# Patient Record
Sex: Male | Born: 1950 | Race: White | Hispanic: No | Marital: Married | Smoking: Never smoker
Health system: Southern US, Community
[De-identification: ages and names within clinical notes are randomized; demographics above are authoritative.]

## PROBLEM LIST (undated history)

## (undated) DIAGNOSIS — E790 Hyperuricemia without signs of inflammatory arthritis and tophaceous disease: Secondary | ICD-10-CM

## (undated) DIAGNOSIS — F329 Major depressive disorder, single episode, unspecified: Secondary | ICD-10-CM

## (undated) DIAGNOSIS — R55 Syncope and collapse: Secondary | ICD-10-CM

## (undated) DIAGNOSIS — I10 Essential (primary) hypertension: Secondary | ICD-10-CM

## (undated) DIAGNOSIS — F32A Depression, unspecified: Secondary | ICD-10-CM

## (undated) DIAGNOSIS — K649 Unspecified hemorrhoids: Secondary | ICD-10-CM

## (undated) DIAGNOSIS — E785 Hyperlipidemia, unspecified: Secondary | ICD-10-CM

## (undated) DIAGNOSIS — Z974 Presence of external hearing-aid: Secondary | ICD-10-CM

## (undated) DIAGNOSIS — C61 Malignant neoplasm of prostate: Secondary | ICD-10-CM

## (undated) DIAGNOSIS — C4490 Unspecified malignant neoplasm of skin, unspecified: Secondary | ICD-10-CM

## (undated) HISTORY — DX: Hyperlipidemia, unspecified: E78.5

## (undated) HISTORY — DX: Major depressive disorder, single episode, unspecified: F32.9

## (undated) HISTORY — DX: Essential (primary) hypertension: I10

## (undated) HISTORY — DX: Hyperuricemia without signs of inflammatory arthritis and tophaceous disease: E79.0

## (undated) HISTORY — DX: Unspecified malignant neoplasm of skin, unspecified: C44.90

## (undated) HISTORY — DX: Syncope and collapse: R55

## (undated) HISTORY — DX: Unspecified hemorrhoids: K64.9

## (undated) HISTORY — DX: Malignant neoplasm of prostate: C61

## (undated) HISTORY — DX: Depression, unspecified: F32.A

---

## 2001-12-14 ENCOUNTER — Emergency Department (HOSPITAL_COMMUNITY): Admission: EM | Admit: 2001-12-14 | Discharge: 2001-12-14 | Payer: Self-pay | Admitting: Emergency Medicine

## 2009-02-20 ENCOUNTER — Ambulatory Visit: Payer: Self-pay | Admitting: General Surgery

## 2009-02-20 HISTORY — PX: COLONOSCOPY: SHX174

## 2009-02-20 LAB — HM COLONOSCOPY: HM Colonoscopy: NORMAL

## 2009-04-13 ENCOUNTER — Ambulatory Visit: Payer: Self-pay | Admitting: General Surgery

## 2009-09-19 DIAGNOSIS — C61 Malignant neoplasm of prostate: Secondary | ICD-10-CM

## 2009-09-19 HISTORY — DX: Malignant neoplasm of prostate: C61

## 2009-09-22 ENCOUNTER — Encounter: Payer: Self-pay | Admitting: Family Medicine

## 2010-01-11 ENCOUNTER — Encounter: Payer: Self-pay | Admitting: Family Medicine

## 2010-02-25 DIAGNOSIS — C4491 Basal cell carcinoma of skin, unspecified: Secondary | ICD-10-CM

## 2010-02-25 HISTORY — DX: Basal cell carcinoma of skin, unspecified: C44.91

## 2010-04-15 ENCOUNTER — Ambulatory Visit: Payer: Self-pay | Admitting: Family Medicine

## 2010-04-15 DIAGNOSIS — E785 Hyperlipidemia, unspecified: Secondary | ICD-10-CM | POA: Insufficient documentation

## 2010-04-15 DIAGNOSIS — R6882 Decreased libido: Secondary | ICD-10-CM | POA: Insufficient documentation

## 2010-04-15 DIAGNOSIS — E790 Hyperuricemia without signs of inflammatory arthritis and tophaceous disease: Secondary | ICD-10-CM | POA: Insufficient documentation

## 2010-04-15 DIAGNOSIS — I10 Essential (primary) hypertension: Secondary | ICD-10-CM | POA: Insufficient documentation

## 2010-04-15 DIAGNOSIS — F329 Major depressive disorder, single episode, unspecified: Secondary | ICD-10-CM | POA: Insufficient documentation

## 2010-06-01 ENCOUNTER — Ambulatory Visit: Payer: Self-pay | Admitting: Family Medicine

## 2010-06-02 LAB — CONVERTED CEMR LAB
Albumin: 4.2 g/dL (ref 3.5–5.2)
BUN: 13 mg/dL (ref 6–23)
Calcium: 9.3 mg/dL (ref 8.4–10.5)
Cholesterol: 157 mg/dL (ref 0–200)
GFR calc non Af Amer: 74.4 mL/min (ref >60)
Glucose, Bld: 90 mg/dL (ref 70–99)
HDL: 33.7 mg/dL — ABNORMAL LOW (ref >39.00)
LDL Cholesterol: 95 mg/dL (ref 0–99)
Potassium: 4.8 meq/L (ref 3.5–5.1)
Sodium: 143 meq/L (ref 135–145)
Total Bilirubin: 0.6 mg/dL (ref 0.3–1.2)
Triglycerides: 144 mg/dL (ref 0.0–149.0)
VLDL: 28.8 mg/dL (ref 0.0–40.0)

## 2010-06-14 ENCOUNTER — Ambulatory Visit: Payer: Self-pay | Admitting: Family Medicine

## 2010-06-14 DIAGNOSIS — E291 Testicular hypofunction: Secondary | ICD-10-CM | POA: Insufficient documentation

## 2010-06-16 ENCOUNTER — Telehealth: Payer: Self-pay | Admitting: Family Medicine

## 2010-07-06 ENCOUNTER — Encounter: Payer: Self-pay | Admitting: Family Medicine

## 2010-08-10 ENCOUNTER — Encounter: Payer: Self-pay | Admitting: Family Medicine

## 2010-08-26 ENCOUNTER — Encounter: Payer: Self-pay | Admitting: Family Medicine

## 2010-10-04 ENCOUNTER — Ambulatory Visit
Admission: RE | Admit: 2010-10-04 | Discharge: 2010-10-19 | Payer: Self-pay | Source: Home / Self Care | Attending: Radiation Oncology | Admitting: Radiation Oncology

## 2010-10-05 ENCOUNTER — Encounter: Payer: Self-pay | Admitting: Family Medicine

## 2010-10-14 ENCOUNTER — Encounter
Admission: RE | Admit: 2010-10-14 | Discharge: 2010-10-14 | Payer: Self-pay | Source: Home / Self Care | Attending: Urology | Admitting: Urology

## 2010-10-19 NOTE — Progress Notes (Signed)
  Phone Note Outgoing Call   Summary of Call: I called patient and LMOVM.  I'll attempt to call him again later.  Initial call taken by: Crawford Givens MD,  June 16, 2010 12:43 PM  Follow-up for Phone Call        I called the patient.  He understands not to fill the testosterone and that his PSA was elevated.  Prev had been  ~1.5 with one reading  ~3.  Given the increase, I would rec follow up with URO and he agrees.  Follow-up by: Crawford Givens MD,  June 16, 2010 5:45 PM  New Problems: PSA, INCREASED (ICD-790.93)   New Problems: PSA, INCREASED (ICD-790.93)

## 2010-10-19 NOTE — Assessment & Plan Note (Signed)
Summary: FOLLOW UP ON LABS/[facility]   Vital Signs:  Patient profile:   60 year old male Height:      72 inches Weight:      214.75 pounds BMI:     29.23 Temp:     98.5 degrees F oral Pulse rate:   72 / minute Pulse rhythm:   regular BP sitting:   132 / 76  (left arm) Cuff size:   large  Vitals Entered By: Delilah Shan CMA Alan Riles Dull) (June 14, 2010 3:24 PM) CC: Follow-up visit - labs   History of Present Illness: fatigue and decrease in libido with borderline testosterone.  This was a repeat value- he has been just below normal twice.   H/o depression that has been treated with some effect with effexor.  No SI/HI.  Asking about options at this point.  Changing MDD meds had had little benefit for patient before.  We discussed low T and possible options.  He would like to try a defined course to see if it changed his symptoms.  See plan.  We talked about risk of T replacement, including need to check on PSA changes and hepatic function.  Mood changes d/w patient and he understood.    Allergies: 1)  ! Codeine 2)  ! Wellbutrin 3)  ! Lipitor  Past History:  Past Medical History: HTN HLD gout depression low testosterone  2011  Review of Systems       See HPI.  Otherwise negative.    Physical Exam  General:  GEN: nad, alert and oriented, affect appropriate Prostate exam w/o nodularity, asymmetry, or tenderness.     Impression & Recommendations:  Problem # 1:  TESTICULAR HYPOFUNCTION (ICD-257.2)  Will check TSH and PSA,  if normal will start androgel and then follow up for labs in 6 weeks.  He will need follow up lab orders based on PSA and TSH (if T replacement started).  He is aware of potential mood changes and need for monitoring.  He'll notify me if he has any troubles once he starts replacement.  We will monitor for symptoms change and continue if appropriate.   Orders: TLB-TSH (Thyroid Stimulating Hormone) (84443-TSH) TLB-PSA (Prostate Specific Antigen)  (84153-PSA)  Complete Medication List: 1)  Crestor 10 Mg Tabs (Rosuvastatin calcium) .Marland Kitchen.. 1 by mouth once daily 2)  Lisinopril 10 Mg Tabs (Lisinopril) .Marland Kitchen.. 1 by mouth once daily 3)  Effexor Xr 75 Mg Xr24h-cap (Venlafaxine hcl) .Marland Kitchen.. 1 by mouth once daily 4)  Allopurinol 300 Mg Tabs (Allopurinol) .Marland Kitchen.. 1 by mouth once daily 5)  Androgel Pump 1.25 Gm/act (1%) Gel (Testosterone) .... 4 pumps applied qam  Patient Instructions: 1)  We'll contact you with your lab report.  If they are normal, then use the gel. Use 4 pumps in the morning.  Let me know how you are doing.  We need to check labs after being on the medicine for about 6 weeks.  Take care.  Prescriptions: ANDROGEL PUMP 1.25 GM/ACT (1%) GEL (TESTOSTERONE) 4 pumps applied qAM  #1 box x 0   Entered and Authorized by:   Crawford Givens MD   Signed by:   Crawford Givens MD on 06/14/2010   Method used:   Print then Give to Patient   RxID:   [id number]   Current Allergies (reviewed today): ! CODEINE ! WELLBUTRIN ! LIPITOR

## 2010-10-19 NOTE — Letter (Signed)
Summary: [facility]  [facility]   Imported By: Lanelle Bal 04/20/2010 14:42:16  _____________________________________________________________________  External Attachment:    Type:   Image     Comment:   External Document

## 2010-10-19 NOTE — Consult Note (Signed)
Summary: [facility]  [facility]   Imported By: Lanelle Bal 07/14/2010 13:20:11  _____________________________________________________________________  External Attachment:    Type:   Image     Comment:   External Document  Appended Document: [facility]    Clinical Lists Changes  Observations: Added new observation of PAST MED HX: HTN HLD gout depression low testosterone  2011 elevated PSA with biopsy per Dr. Isabel Caprice 2011 (07/14/2010 21:33)       Past History:  Past Medical History: HTN HLD gout depression low testosterone  2011 elevated PSA with biopsy per Dr. Isabel Caprice 2011

## 2010-10-19 NOTE — Assessment & Plan Note (Signed)
Summary: NEW PT TO BE ESTABLISHED   Vital Signs:  Patient profile:   60 year old male Weight:      215.50 pounds Temp:     98.3 degrees F oral Pulse rate:   84 / minute Pulse rhythm:   regular BP sitting:   140 / 82  (left arm) Cuff size:   large  Vitals Entered By: Janee Morn CMA (April 15, 2010 9:47 AM) CC: New patient to establish care   History of Present Illness: Prev patient of Dr. Cherlynn Polo in [place].  Elevated Cholesterol: Using medications without problems:yes Muscle aches: no Other complaints: intolerant of other statins.    Hypertension:      Using medication without problems or lightheadedness: yes Chest pain with exertion:no Edema:no Short of breath:no Average home BPs: not checked Other issues: no  Depressed Mood:  controlled on meds, prev in counseling.  Doing well.  Started around the time of the divorce.  Prev was on prozac and had to change to effexor.     Asking about low T, had been low normal prev.  Noted low sex drive.   Gout- L great toe.  Had done well on allopurinol.  Began to have symptoms after coming off the med.    Current Medications (verified): 1)  Crestor 10 Mg Tabs (Rosuvastatin Calcium) .Marland Kitchen.. 1 By Mouth Once Daily 2)  Lisinopril 10 Mg Tabs (Lisinopril) .Marland Kitchen.. 1 By Mouth Once Daily 3)  Effexor Xr 75 Mg Xr24h-Cap (Venlafaxine Hcl) .Marland Kitchen.. 1 By Mouth Once Daily 4)  Allopurinol 300 Mg Tabs (Allopurinol) .Marland Kitchen.. 1 By Mouth Qday  Allergies (verified): 1)  ! Codeine 2)  ! Wellbutrin 3)  ! 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 F alive and at home-hospice for parkinson's, HTN and HLD, DM2 M alive with mult heart surgeries- valve repair, osteoporosis, vertebral fx  Social History: Reviewed history and no changes required. From [employer] grad, doctorate in Counseling, prev worked in Chartered loss adjuster in [place] Occ alcohol No tob, no illicits Prev golfer Divorced after 17 years of marriage  Review of Systems       See HPI.  Otherwise negative.    Physical Exam  General:  GEN: nad, alert and oriented, affect appropriate HEENT: mucous membranes moist NECK: supple w/o LA CV: rrr.  no murmur PULM: ctab, no inc wob ABD: soft, +bs EXT: no edema SKIN: no acute rash    Impression & Recommendations:  Problem # 1:  DEPRESSIVE DISORDER (ICD-311) No change in meds.  Controlled.  His updated medication list for this problem includes:    Effexor Xr 75 Mg Xr24h-cap (Venlafaxine hcl) .Marland Kitchen... 1 by mouth once daily  Problem # 2:  HYPERLIPIDEMIA (ICD-272.4) Return for fasting labs.  No change in meds.  His updated medication list for this problem includes:    Crestor 10 Mg Tabs (Rosuvastatin calcium) .Marland Kitchen... 1 by mouth once daily  Problem # 3:  HYPERTENSION, BENIGN ESSENTIAL (ICD-401.1) controlled, no change in meds.  REturn for labs. His  updated medication list for this problem includes:    Lisinopril 10 Mg Tabs (Lisinopril) .Marland Kitchen... 1 by mouth once daily  Problem # 4:  GOUT (ICD-274.9) Restart allopurinol and return for uric acid.   His updated medication list for this problem includes:    Allopurinol 300 Mg Tabs (Allopurinol) .Marland Kitchen... 1 by mouth qday  Problem # 5:  LIBIDO, DECREASED (ICD-799.81) return for labs.  will check AM level.   Complete Medication List: 1)  Crestor 10 Mg Tabs (Rosuvastatin calcium) .Marland Kitchen.. 1 by mouth once daily 2)  Lisinopril 10 Mg Tabs (Lisinopril) .Marland Kitchen.. 1 by mouth once  daily 3)  Effexor Xr 75 Mg Xr24h-cap (Venlafaxine hcl) .Marland Kitchen.. 1 by mouth once daily 4)  Allopurinol 300 Mg Tabs (Allopurinol) .Marland Kitchen.. 1 by mouth qday  Patient Instructions: 1)  Come back for fasting labs in a 6 weeks.  2)  cmet/lipid 401.1 3)  uric acid 274.9 4)  testosterone 799.81 5)  Call the pharmacy if you need refills.  Prescriptions: ALLOPURINOL 300 MG TABS (ALLOPURINOL) 1 by mouth qday  #90 x 3   Entered and Authorized by:   Crawford Givens MD   Signed by:   Crawford Givens MD on 04/15/2010   Method used:   [facility] (retail)       [address]       [place], [place]  [address]       Ph: [phone] or [phone]       Fax: [phone]   RxID:   [id number] ALLOPURINOL 300 MG TABS (ALLOPURINOL) 1 by mouth qday  #90 x 3   Entered and Authorized by:   Crawford Givens MD   Signed by:   Crawford Givens MD on 04/15/2010   Method used:   [facility]        [facility]  [address] (retail)       [address]       [place], [place]  [address]       Ph: [phone] or [phone]       Fax: [phone]   RxID:   [id number]   Current Allergies (reviewed today): ! CODEINE ! WELLBUTRIN ! LIPITOR

## 2010-10-19 NOTE — Letter (Signed)
Summary: [facility]  [facility]   Imported By: Lanelle Bal 04/20/2010 14:40:55  _____________________________________________________________________  External Attachment:    Type:   Image     Comment:   External Document  Appended Document: [facility]    Clinical Lists Changes  Observations: Added new observation of DM PROGRESS: N/A (04/21/2010 10:48) Added new observation of DM FSREVIEW: N/A (04/21/2010 10:48) Added new observation of CHIEF CMPLNT: Preventive Care (04/21/2010 10:48) Added new observation of PNEUMOVAX: Historical (09/19/2008 20:24)         Colorectal Screening:  Colonoscopy Results:    Date of Exam: 09/19/2008  Colonoscopy Comments:    done 2010, hemorroids noted  Immunization & Chemoprophylaxis:    Pneumovax: Historical  (09/19/2008)     Immunization History:  Pneumovax Immunization History:    Pneumovax:  historical (09/19/2008)

## 2010-10-20 ENCOUNTER — Ambulatory Visit: Payer: Self-pay | Admitting: Radiation Oncology

## 2010-10-21 NOTE — Letter (Signed)
Summary: [facility]  Chipper Herb MD/[facility]   Imported By: Lester  10/12/2010 10:12:26  _____________________________________________________________________  External Attachment:    Type:   Image     Comment:   External Document  Appended Document: [facility]b [facility]    Clinical Lists Changes  Observations: Added new observation of PAST MED HX: HTN HLD gout depression low testosterone  2011 prostate CA per Dr. Isabel Caprice 2011, Seed implant per Dr. Dayton Scrape (10/12/2010 22:33)       Past History:  Past Medical History: HTN HLD gout depression low testosterone  2011 prostate CA per Dr. Isabel Caprice 2011, Seed implant per Dr. Dayton Scrape

## 2010-10-21 NOTE — Letter (Signed)
Summary: [facility]  [facility]   Imported By: Maryln Gottron 09/03/2010 14:51:13  _____________________________________________________________________  External Attachment:    Type:   Image     Comment:   External Document  Appended Document: [facility]    Clinical Lists Changes  Observations: Added new observation of PAST MED HX: HTN HLD gout depression low testosterone  2011 prostate CA per Dr. Isabel Caprice 2011 (09/05/2010 12:47)       Past History:  Past Medical History: HTN HLD gout depression low testosterone  2011 prostate CA per Dr. Isabel Caprice 2011

## 2010-11-25 ENCOUNTER — Telehealth: Payer: Self-pay | Admitting: Family Medicine

## 2010-11-26 ENCOUNTER — Encounter: Payer: Self-pay | Admitting: Family Medicine

## 2010-11-30 NOTE — Progress Notes (Signed)
Summary: elevated bp  Phone Note Call from Patient Call back at Home Phone [phone]   Caller: Patient Call For: Crawford Givens MD Summary of Call: Patient says that he has been having elevated blood pressure. He has been taking 20mg  of lisinopril instead of 10mg , the 2 reading that he gave me are 145/92 and 155/97. He is going to be out of town for work for a couple of weeks and is asking if he could take 40 mg until he is able to see you. Please advise. Initial call taken by: Melody Comas,  November 25, 2010 9:29 AM  Follow-up for Phone Call        I would increase to 30mg  of lisinopril a day (3 of the 10mg  tabs once a day) for 5 days.  If BP still >140/90, then increase to 40mg  a day.  thanks.   Follow-up by: Crawford Givens MD,  November 25, 2010 1:39 PM  Additional Follow-up for Phone Call Additional follow up Details #1::        Patient Advised.  Additional Follow-up by: Delilah Shan CMA (AAMA),  November 25, 2010 2:22 PM

## 2010-12-06 LAB — CBC
HCT: 44.5 % (ref 39.0–52.0)
Hemoglobin: 14.9 g/dL (ref 13.0–17.0)
MCH: 30.7 pg (ref 26.0–34.0)
MCV: 91.8 fL (ref 78.0–100.0)
Platelets: 278 10*3/uL (ref 150–400)
RBC: 4.85 MIL/uL (ref 4.22–5.81)
WBC: 7.2 10*3/uL (ref 4.0–10.5)

## 2010-12-06 LAB — COMPREHENSIVE METABOLIC PANEL
Albumin: 4 g/dL (ref 3.5–5.2)
Alkaline Phosphatase: 86 U/L (ref 39–117)
BUN: 17 mg/dL (ref 6–23)
CO2: 29 mEq/L (ref 19–32)
Chloride: 100 mEq/L (ref 96–112)
GFR calc non Af Amer: 59 mL/min — ABNORMAL LOW (ref >60)
Glucose, Bld: 90 mg/dL (ref 70–99)
Potassium: 4.6 mEq/L (ref 3.5–5.1)
Total Bilirubin: 0.4 mg/dL (ref 0.3–1.2)

## 2010-12-07 NOTE — Letter (Signed)
Summary: [facility]  [facility]   Imported By: Kassie Mends 12/01/2010 09:33:59  _____________________________________________________________________  External Attachment:    Type:   Image     Comment:   External Document  Appended Document: [facility]    Clinical Lists Changes

## 2010-12-13 ENCOUNTER — Ambulatory Visit (HOSPITAL_BASED_OUTPATIENT_CLINIC_OR_DEPARTMENT_OTHER)
Admission: RE | Admit: 2010-12-13 | Discharge: 2010-12-13 | Disposition: A | Discharge status: Home / Self Care | Payer: BC Managed Care – PPO | Source: Ambulatory Visit | Attending: Urology | Admitting: Urology

## 2010-12-13 ENCOUNTER — Ambulatory Visit: Payer: BC Managed Care – PPO | Attending: Radiation Oncology | Admitting: Radiation Oncology

## 2010-12-13 ENCOUNTER — Ambulatory Visit (HOSPITAL_COMMUNITY): Payer: BC Managed Care – PPO | Attending: Urology

## 2010-12-13 DIAGNOSIS — C61 Malignant neoplasm of prostate: Secondary | ICD-10-CM

## 2010-12-13 DIAGNOSIS — Z01812 Encounter for preprocedural laboratory examination: Secondary | ICD-10-CM | POA: Insufficient documentation

## 2010-12-13 DIAGNOSIS — K219 Gastro-esophageal reflux disease without esophagitis: Secondary | ICD-10-CM | POA: Insufficient documentation

## 2010-12-13 DIAGNOSIS — I10 Essential (primary) hypertension: Secondary | ICD-10-CM | POA: Insufficient documentation

## 2010-12-13 DIAGNOSIS — Z01810 Encounter for preprocedural cardiovascular examination: Secondary | ICD-10-CM | POA: Insufficient documentation

## 2010-12-14 ENCOUNTER — Encounter: Payer: Self-pay | Admitting: Family Medicine

## 2010-12-14 DIAGNOSIS — C61 Malignant neoplasm of prostate: Secondary | ICD-10-CM

## 2010-12-15 NOTE — Op Note (Signed)
  NAME:  Ryan Blevins, Ryan Blevins             ACCOUNT NO.:  [id number]  MEDICAL RECORD NO.:  [id number]          PATIENT TYPE:  AMB  LOCATION:  [facility]                         FACILITY:  [facility]  PHYSICIAN:  Valetta Fuller, M.D.  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  PROCEDURE PERFORMED: 1. I 125 prostate seed implantation via nuclear drawn robotic     implanter. 2. Flexible cystoscopy.  SURGEON:  Valetta Fuller, MD and Maryln Gottron, MD  ANESTHESIA:  General.  INDICATIONS:  Mr. Mccauley is 60 years of age.  The patient was diagnosed with favorable clinical stage T1c adenocarcinoma of the prostate.  The patient had a PSA elevation of 6.1.  Digital rectal exam was unremarkable and revealed a small prostate.  At the time of ultrasound, his prostate was in the 15-20 gram range.  One out of 12 cores were positive for Gleason 3 + 3 = 6 adenocarcinoma of the prostate.  He was felt to have clinical stage T1c favorable disease.  All options were discussed with him including active surveillance, surgical approaches, and various radiation approaches as well as additional therapies.  The patient was also seen by Dr. Dayton Scrape in consultation.  The patient chose I 125 seed implantation.  TECHNIQUE AND FINDINGS:  The patient was brought to the operating room where he had successful induction of general anesthesia.  Compression boots were ordered and the patient received perioperative ciprofloxacin. Radiation-oncology was involved in the patient's care initially.  The patient was positioned in a high lithotomy position.  Transrectal ultrasound probe was placed with an anchoring stand.  Anchoring needles were placed in the prostate.  With a transrectal  ultrasound in place, real time contouring of the prostate, urethra, and rectum were performed.  The dosing parameters were then established and a dosing plan formulated.  We were then contacted and came to the operating room. Appropriate surgical time-out was reperformed at that time.  All needle passage was done with real time sagittal transrectal ultrasound guidance.  All needle with visualization was quite good.  A total of 24 needles were placed.  The delivery of activated seeds was done by the nuclear drawn robotic implanter.  Reference dose was 145 gray.  A total of 75 active seeds were implanted.  At the completion of the procedure, the fluoroscopic distribution of the seeds appeared excellent.  The patient's Foley catheter was removed and flexible cystourethroscopy revealed no wayward seeds within the prostatic urethra or bladder.  Lidocaine jelly was instilled and a new Foley catheter was inserted with drainage of clear urine.  The patient was brought to recovery room in stable condition having had no obvious complications or problems.     Valetta Fuller, M.D.     DSG/MEDQ  D:  12/13/2010  T:  12/13/2010  Job:  [id number]  Electronically Signed by Barron Alvine M.D. on 12/15/2010 09:34:32 AM

## 2011-01-04 ENCOUNTER — Ambulatory Visit: Payer: BC Managed Care – PPO | Attending: Radiation Oncology | Admitting: Radiation Oncology

## 2011-01-04 DIAGNOSIS — C61 Malignant neoplasm of prostate: Secondary | ICD-10-CM | POA: Insufficient documentation

## 2011-01-06 ENCOUNTER — Telehealth: Payer: Self-pay | Admitting: *Deleted

## 2011-01-06 DIAGNOSIS — I10 Essential (primary) hypertension: Secondary | ICD-10-CM

## 2011-01-06 NOTE — Telephone Encounter (Signed)
Have him inc to 40mg  of lisinopril a day and then come in for nonfasting labs after about 1 week. Get a OV a few days after that.  Thanks.

## 2011-01-06 NOTE — Telephone Encounter (Signed)
Pt's lisinopril dose was increased to 30 mg's about 2 months ago.  His blood pressure is still running too high- 135-137/95-97.  He is asking if he should increase the dose again, or does he need to come back in for follow up. Please advise. [facility]

## 2011-01-07 NOTE — Telephone Encounter (Signed)
Advised pt, lab and follow up appts made.

## 2011-01-14 ENCOUNTER — Other Ambulatory Visit (INDEPENDENT_AMBULATORY_CARE_PROVIDER_SITE_OTHER): Payer: BC Managed Care – PPO | Admitting: Family Medicine

## 2011-01-14 DIAGNOSIS — I10 Essential (primary) hypertension: Secondary | ICD-10-CM

## 2011-01-14 LAB — BASIC METABOLIC PANEL
BUN: 15 mg/dL (ref 6–23)
CO2: 28 mEq/L (ref 19–32)
Chloride: 104 mEq/L (ref 96–112)
Creatinine, Ser: 1.2 mg/dL (ref 0.4–1.5)
Glucose, Bld: 98 mg/dL (ref 70–99)
Potassium: 5 mEq/L (ref 3.5–5.1)

## 2011-01-17 ENCOUNTER — Other Ambulatory Visit: Payer: Self-pay | Admitting: Family Medicine

## 2011-01-18 ENCOUNTER — Encounter: Payer: Self-pay | Admitting: Family Medicine

## 2011-01-19 ENCOUNTER — Ambulatory Visit (INDEPENDENT_AMBULATORY_CARE_PROVIDER_SITE_OTHER): Payer: BC Managed Care – PPO | Admitting: Family Medicine

## 2011-01-19 ENCOUNTER — Encounter: Payer: Self-pay | Admitting: Family Medicine

## 2011-01-19 DIAGNOSIS — I10 Essential (primary) hypertension: Secondary | ICD-10-CM

## 2011-01-19 MED ORDER — TADALAFIL 20 MG PO TABS: 20.0000 mg | ORAL_TABLET | Freq: Every day | ORAL | Status: DC | PRN

## 2011-01-19 NOTE — Patient Instructions (Signed)
Check your pressure in the morning after you urinate.  Let me know about the readings a few weeks.  Don't change your meds in the meantime.  Take care.

## 2011-01-19 NOTE — Progress Notes (Signed)
Hypertension:    Using medication without problems or lightheadedness: see below Chest pain with exertion:no Edema:no Short of breath:no Average home BPs:  Other issues: recently with prostate CA s/p seed implants and doing well.  Followed by uro and rady onc.  Prior to procedure, his BP was up.  Also with sig changes at work with resultant stress.  Was SBP 140-150.   Today is the first day that his BP has been lower.  On 20mg  a day of lisinopril.   Meds, vitals, and allergies reviewed.   ROS: See HPI.  Otherwise, noncontributory.  GEN: nad, alert and oriented HEENT: mucous membranes moist NECK: supple w/o LA CV: rrr.  PULM: ctab, no inc wob EXT: no edema SKIN: no acute rash

## 2011-01-19 NOTE — Assessment & Plan Note (Signed)
Controlled.  No change in meds.  He'll check BP at home.  I think some of the elevation was related to change in exercise routine in the midst of cancer dx/treatment and work changes.  He'll call back with update.  He agrees.  Labs d/w pt.

## 2011-03-04 ENCOUNTER — Other Ambulatory Visit: Payer: Self-pay | Admitting: *Deleted

## 2011-03-04 MED ORDER — LISINOPRIL 10 MG PO TABS: 20.0000 mg | ORAL_TABLET | Freq: Every day | ORAL | Status: DC

## 2011-03-10 ENCOUNTER — Ambulatory Visit (INDEPENDENT_AMBULATORY_CARE_PROVIDER_SITE_OTHER): Payer: BC Managed Care – PPO | Admitting: Family Medicine

## 2011-03-10 ENCOUNTER — Encounter: Payer: Self-pay | Admitting: Family Medicine

## 2011-03-10 DIAGNOSIS — S060XAA Concussion with loss of consciousness status unknown, initial encounter: Secondary | ICD-10-CM | POA: Insufficient documentation

## 2011-03-10 DIAGNOSIS — Z136 Encounter for screening for cardiovascular disorders: Secondary | ICD-10-CM

## 2011-03-10 DIAGNOSIS — R55 Syncope and collapse: Secondary | ICD-10-CM

## 2011-03-10 DIAGNOSIS — S060X9A Concussion with loss of consciousness of unspecified duration, initial encounter: Secondary | ICD-10-CM

## 2011-03-10 NOTE — Assessment & Plan Note (Addendum)
?  relation to flomax?  Stop this and call back next week.  He had orthostatic pressure today but w/o sx.  EKG reassuring and I think this is all related to orthostatic changes, not CAD.  He has no CP.  If sx continue will likely need cards eval.  D/w pt and he agreed.

## 2011-03-10 NOTE — Patient Instructions (Signed)
Stop the flomax, drink plenty of fluids, and call me with an update next week.  Try to rest in the meantime.

## 2011-03-10 NOTE — Progress Notes (Signed)
Hypertension:    Using medication without problems or lightheadedness: on meds but he had 2 episodes of syncope recently after standing.  He fell and hit his head 2 weeks ago.  He has had some word searching in the meantime with a mild HA in the meantime.  No other focal neuro changes.  He doesn't have consistent symptoms with position change.  Chest pain with exertion: no Edema:no Short of breath:no Average home BPs: similar to today, occ higher.  Other issues: not on cialis.  Prostate cancer s/p seed implant per URO.  Had been started on flomax- this predates the orthostasis.   Meds, vitals, and allergies reviewed.   PMH and SH reviewed  ROS: See HPI.  Otherwise negative.    GEN: nad, alert and oriented HEENT: mucous membranes moist, tm wnl, OP wnl NECK: supple w/o LA CV: rrr. PULM: ctab, no inc wob ABD: soft, +bs EXT: no edema SKIN: no acute rash CN 2-12 wnl B, S/S/DTR wnl x4  EKG reviewed.

## 2011-03-11 NOTE — Assessment & Plan Note (Addendum)
Likely concussion- no indication to image at this point.  Benign exam 2 weeks after last episode.  D/w pt about mental and physical rest.  If sx continue, then he'll need further w/u.  He appears okay for outpatient fu.  >25 min spent with face to face with patient, >50% counseling.

## 2011-05-02 ENCOUNTER — Encounter: Payer: Self-pay | Admitting: Family Medicine

## 2011-05-02 DIAGNOSIS — IMO0001 Reserved for inherently not codable concepts without codable children: Secondary | ICD-10-CM | POA: Insufficient documentation

## 2011-05-06 ENCOUNTER — Other Ambulatory Visit: Payer: Self-pay | Admitting: Family Medicine

## 2011-10-04 ENCOUNTER — Other Ambulatory Visit: Payer: Self-pay | Admitting: Family Medicine

## 2011-12-13 ENCOUNTER — Encounter: Payer: Self-pay | Admitting: *Deleted

## 2011-12-13 NOTE — Progress Notes (Signed)
10/05/2010=I-PSS= 1110001 score=4

## 2012-04-26 ENCOUNTER — Other Ambulatory Visit: Payer: Self-pay | Admitting: Family Medicine

## 2012-04-26 NOTE — Telephone Encounter (Signed)
Electronic refill request.  Patient is past due for OV.  Please advise.

## 2012-04-26 NOTE — Telephone Encounter (Signed)
Sent, needs cpe with labs scheduled.

## 2012-04-27 NOTE — Telephone Encounter (Signed)
Patient advised.  Call sent to scheduler for appt. 

## 2012-05-28 ENCOUNTER — Other Ambulatory Visit: Payer: Self-pay | Admitting: Family Medicine

## 2012-05-28 DIAGNOSIS — M109 Gout, unspecified: Secondary | ICD-10-CM

## 2012-05-28 DIAGNOSIS — I10 Essential (primary) hypertension: Secondary | ICD-10-CM

## 2012-05-31 ENCOUNTER — Other Ambulatory Visit: Payer: Self-pay | Admitting: Family Medicine

## 2012-05-31 ENCOUNTER — Other Ambulatory Visit: Payer: BC Managed Care – PPO

## 2012-05-31 ENCOUNTER — Other Ambulatory Visit (INDEPENDENT_AMBULATORY_CARE_PROVIDER_SITE_OTHER): Payer: BC Managed Care – PPO

## 2012-05-31 DIAGNOSIS — M109 Gout, unspecified: Secondary | ICD-10-CM

## 2012-05-31 DIAGNOSIS — F329 Major depressive disorder, single episode, unspecified: Secondary | ICD-10-CM

## 2012-05-31 DIAGNOSIS — I10 Essential (primary) hypertension: Secondary | ICD-10-CM

## 2012-05-31 LAB — COMPREHENSIVE METABOLIC PANEL
ALT: 32 U/L (ref 0–53)
AST: 30 U/L (ref 0–37)
Albumin: 4.3 g/dL (ref 3.5–5.2)
Alkaline Phosphatase: 69 U/L (ref 39–117)
BUN: 17 mg/dL (ref 6–23)
Chloride: 107 mEq/L (ref 96–112)
Potassium: 4.8 mEq/L (ref 3.5–5.1)
Sodium: 142 mEq/L (ref 135–145)

## 2012-05-31 LAB — TSH: TSH: 2.12 u[IU]/mL (ref 0.35–5.50)

## 2012-05-31 LAB — LIPID PANEL
Cholesterol: 144 mg/dL (ref 0–200)
LDL Cholesterol: 76 mg/dL (ref 0–99)
Total CHOL/HDL Ratio: 4
VLDL: 27.4 mg/dL (ref 0.0–40.0)

## 2012-06-04 ENCOUNTER — Other Ambulatory Visit: Payer: Self-pay | Admitting: Family Medicine

## 2012-06-08 ENCOUNTER — Encounter: Payer: Self-pay | Admitting: Family Medicine

## 2012-06-08 ENCOUNTER — Ambulatory Visit (INDEPENDENT_AMBULATORY_CARE_PROVIDER_SITE_OTHER): Payer: BC Managed Care – PPO | Admitting: Family Medicine

## 2012-06-08 VITALS — BP 152/90 | HR 98 | Temp 98.3°F | Wt 218.0 lb

## 2012-06-08 DIAGNOSIS — Z0000 Encounter for general adult medical examination without abnormal findings: Secondary | ICD-10-CM

## 2012-06-08 DIAGNOSIS — E785 Hyperlipidemia, unspecified: Secondary | ICD-10-CM

## 2012-06-08 DIAGNOSIS — Z23 Encounter for immunization: Secondary | ICD-10-CM

## 2012-06-08 DIAGNOSIS — I10 Essential (primary) hypertension: Secondary | ICD-10-CM

## 2012-06-08 MED ORDER — VENLAFAXINE HCL ER 75 MG PO CP24: 150.0000 mg | ORAL_CAPSULE | Freq: Every day | ORAL | Status: DC

## 2012-06-08 MED ORDER — TADALAFIL 5 MG PO TABS: 5.0000 mg | ORAL_TABLET | Freq: Every day | ORAL | Status: DC | PRN

## 2012-06-08 MED ORDER — LISINOPRIL 10 MG PO TABS: 30.0000 mg | ORAL_TABLET | Freq: Every day | ORAL | Status: DC

## 2012-06-08 NOTE — Patient Instructions (Addendum)
Check with your insurance to see if they will cover the shingles shot. I would get a flu shot each fall.   Take care. Glad to see you.  Increase the lisinopril to 30mg  a day and let me know about your pressure.

## 2012-06-10 DIAGNOSIS — Z0000 Encounter for general adult medical examination without abnormal findings: Secondary | ICD-10-CM | POA: Insufficient documentation

## 2012-06-10 NOTE — Assessment & Plan Note (Signed)
Inc ACE and he'll notify me about his BP readings.

## 2012-06-10 NOTE — Assessment & Plan Note (Signed)
Controlled, continue current meds.   

## 2012-06-10 NOTE — Progress Notes (Signed)
CPE- See plan.  Routine anticipatory guidance given to patient.  See health maintenance. Tetanus <10 years per patient Flu done yearly Shingles shot discussed. PNA shot due at 65 PSA per uro- prostate cancer followed by URO. Colonoscopy 2009 perDr. Lemar Livings.   Advance directive d/w pt. Wife designated if incapacitated.   Gout- off meds w/o flares and doing well.  Uric acid level d/w pt.   Mood stable on current meds in spite of work load. No SI/HI.  Would like to continue meds.    Hypertension:    Using medication without problems or lightheadedness: yes Chest pain with exertion:no Edema:no Short of breath:no Average home ZOX:WRUEA with episodic elevation.   PMH and SH reviewed  Meds, vitals, and allergies reviewed.   ROS: See HPI.  Otherwise negative.    GEN: nad, alert and oriented HEENT: mucous membranes moist NECK: supple w/o LA CV: rrr. PULM: ctab, no inc wob ABD: soft, +bs EXT: no edema SKIN: no acute rash DRE deferred.

## 2012-06-10 NOTE — Assessment & Plan Note (Addendum)
Routine anticipatory guidance given to patient. See health maintenance.  Tetanus <10 years per patient  Flu done yearly  Shingles shot discussed.  PNA shot due at 65  PSA per uro- prostate cancer followed by URO.  Colonoscopy 2009 per Dr. Lemar Livings.  Advance directive d/w pt. Wife designated if incapacitated.

## 2012-07-18 ENCOUNTER — Other Ambulatory Visit: Payer: Self-pay | Admitting: Family Medicine

## 2012-07-18 NOTE — Telephone Encounter (Signed)
Spoke with patient and he has the printed scripts in his hand and will take to the pharmacy.

## 2012-09-24 ENCOUNTER — Telehealth: Payer: Self-pay

## 2012-09-24 NOTE — Telephone Encounter (Signed)
Prior auth needed for Crestor; form is in Dr Lianne Bushy in box.

## 2012-09-24 NOTE — Telephone Encounter (Signed)
Done, signed, in my outbox.  Thanks.

## 2012-09-25 NOTE — Telephone Encounter (Signed)
Completed form faxed [phone].

## 2012-09-28 NOTE — Telephone Encounter (Signed)
Prior auth given and letter faxed to [facility] and placed in Dr Lianne Bushy in box for signature and scanning.

## 2013-06-07 ENCOUNTER — Other Ambulatory Visit: Payer: Self-pay | Admitting: Family Medicine

## 2013-06-19 ENCOUNTER — Other Ambulatory Visit: Payer: Self-pay | Admitting: Family Medicine

## 2013-07-11 ENCOUNTER — Other Ambulatory Visit: Payer: Self-pay | Admitting: *Deleted

## 2013-07-11 MED ORDER — VENLAFAXINE HCL ER 75 MG PO CP24: 150.0000 mg | ORAL_CAPSULE | Freq: Every day | ORAL | Status: DC

## 2013-07-11 MED ORDER — LISINOPRIL 10 MG PO TABS: 30.0000 mg | ORAL_TABLET | Freq: Every day | ORAL | Status: DC

## 2013-08-04 ENCOUNTER — Other Ambulatory Visit: Payer: Self-pay | Admitting: Family Medicine

## 2013-08-04 DIAGNOSIS — M109 Gout, unspecified: Secondary | ICD-10-CM

## 2013-08-04 DIAGNOSIS — E785 Hyperlipidemia, unspecified: Secondary | ICD-10-CM

## 2013-08-06 ENCOUNTER — Other Ambulatory Visit (INDEPENDENT_AMBULATORY_CARE_PROVIDER_SITE_OTHER): Payer: BC Managed Care – PPO

## 2013-08-06 DIAGNOSIS — M109 Gout, unspecified: Secondary | ICD-10-CM

## 2013-08-06 DIAGNOSIS — I10 Essential (primary) hypertension: Secondary | ICD-10-CM

## 2013-08-06 DIAGNOSIS — E785 Hyperlipidemia, unspecified: Secondary | ICD-10-CM

## 2013-08-06 LAB — COMPREHENSIVE METABOLIC PANEL
ALT: 48 U/L (ref 0–53)
CO2: 28 mEq/L (ref 19–32)
Calcium: 9.6 mg/dL (ref 8.4–10.5)
Chloride: 102 mEq/L (ref 96–112)
Creatinine, Ser: 1.1 mg/dL (ref 0.4–1.5)
GFR: 74.41 mL/min (ref >60.00)
Glucose, Bld: 104 mg/dL — ABNORMAL HIGH (ref 70–99)
Total Protein: 7.5 g/dL (ref 6.0–8.3)

## 2013-08-06 LAB — URIC ACID: Uric Acid, Serum: 8.3 mg/dL — ABNORMAL HIGH (ref 4.0–7.8)

## 2013-08-06 LAB — LIPID PANEL: Cholesterol: 122 mg/dL (ref 0–200)

## 2013-08-12 ENCOUNTER — Ambulatory Visit (INDEPENDENT_AMBULATORY_CARE_PROVIDER_SITE_OTHER): Payer: BC Managed Care – PPO | Admitting: Family Medicine

## 2013-08-12 ENCOUNTER — Encounter: Payer: Self-pay | Admitting: Family Medicine

## 2013-08-12 VITALS — BP 124/76 | HR 88 | Temp 98.5°F | Ht 72.0 in | Wt 199.2 lb

## 2013-08-12 DIAGNOSIS — M109 Gout, unspecified: Secondary | ICD-10-CM

## 2013-08-12 DIAGNOSIS — C61 Malignant neoplasm of prostate: Secondary | ICD-10-CM

## 2013-08-12 DIAGNOSIS — F3289 Other specified depressive episodes: Secondary | ICD-10-CM

## 2013-08-12 DIAGNOSIS — Z0000 Encounter for general adult medical examination without abnormal findings: Secondary | ICD-10-CM

## 2013-08-12 DIAGNOSIS — Z23 Encounter for immunization: Secondary | ICD-10-CM

## 2013-08-12 DIAGNOSIS — E785 Hyperlipidemia, unspecified: Secondary | ICD-10-CM

## 2013-08-12 DIAGNOSIS — Z1211 Encounter for screening for malignant neoplasm of colon: Secondary | ICD-10-CM

## 2013-08-12 DIAGNOSIS — F329 Major depressive disorder, single episode, unspecified: Secondary | ICD-10-CM

## 2013-08-12 DIAGNOSIS — I10 Essential (primary) hypertension: Secondary | ICD-10-CM

## 2013-08-12 MED ORDER — VENLAFAXINE HCL ER 75 MG PO CP24: 150.0000 mg | ORAL_CAPSULE | Freq: Every day | ORAL | Status: DC

## 2013-08-12 MED ORDER — ROSUVASTATIN CALCIUM 10 MG PO TABS: 10.0000 mg | ORAL_TABLET | Freq: Every day | ORAL | Status: DC

## 2013-08-12 MED ORDER — LISINOPRIL 30 MG PO TABS: 30.0000 mg | ORAL_TABLET | Freq: Every day | ORAL | Status: DC

## 2013-08-12 NOTE — Patient Instructions (Signed)
Shirlee Limerick will call about your referral. Change to a single dose of 30mg  lisinopril.  Take care.  Glad to see you.  Schedule a physical for 07/2014.

## 2013-08-12 NOTE — Progress Notes (Signed)
Pre-visit discussion using our clinic review tool. No additional management support is needed unless otherwise documented below in the visit note.  CPE- See plan.  Routine anticipatory guidance given to patient.  See health maintenance. Tetanus <10 years ago per patient.  PNA 2010 Flu shot today.  Shingles d/w pt.  Encouraged.  Colonoscopy 2009.  He has had some bleeding and it is reasonable to set up again.  Dr. Lemar Livings.   PSA per urology.   Diet and exercise d/w pt.  Intentional weight loss.  Cut out breads and sweet drinks. No pork.  Lean meat.  Less exercise, but mainly diet work.  Wife designated if incapacitated.    Gout, no flares.  Off allopurinol.  Labs d/w pt.  Would stay off allopurinol for now.  Intentional weight loss.   Hypertension:    Using medication without problems or lightheadedness: rarely lightheaded, resolved with slowly standing.   Chest pain with exertion:no Edema:no Short of breath:no  Mood is good. Work is busy.  Has things to look forward to.  Compliant with meds.    Prostate CA and ED per uro.   Elevated Cholesterol: Using medications without problems:yes Muscle aches: no Diet compliance:yes Exercise:no, encouraged.   PMH and SH reviewed  Meds, vitals, and allergies reviewed.   ROS: See HPI.  Otherwise negative.    GEN: nad, alert and oriented HEENT: mucous membranes moist NECK: supple w/o LA CV: rrr. PULM: ctab, no inc wob ABD: soft, +bs EXT: no edema SKIN: no acute rash

## 2013-08-12 NOTE — Assessment & Plan Note (Signed)
Per uro.  

## 2013-08-12 NOTE — Assessment & Plan Note (Signed)
Controlled, continue current meds.  He is working on intentional weight loss.  Labs d/w pt.

## 2013-08-12 NOTE — Assessment & Plan Note (Signed)
Controlled, continue current meds.  He is working on intentional weight loss.  Labs d/w pt.  

## 2013-08-12 NOTE — Assessment & Plan Note (Signed)
Routine anticipatory guidance given to patient.  See health maintenance. Tetanus <10 years ago per patient.  PNA 2010 Flu shot today.  Shingles d/w pt.  Encouraged.  Colonoscopy 2009.  He has had some bleeding and it is reasonable to set up again.  Dr. Lemar Livings.   PSA per urology.   Diet and exercise d/w pt.  Intentional weight loss.  Cut out breads and sweet drinks. No pork.  Lean meat.  Less exercise, but mainly diet work.  Wife designated if incapacitated.

## 2013-08-12 NOTE — Assessment & Plan Note (Signed)
Controlled, continue as is.  Doing well.  

## 2013-08-12 NOTE — Assessment & Plan Note (Signed)
No flares, stay off allopurinol for now.  Labs d/w pt.

## 2013-09-05 ENCOUNTER — Encounter: Payer: Self-pay | Admitting: Family Medicine

## 2013-09-26 ENCOUNTER — Ambulatory Visit: Payer: Self-pay | Admitting: General Surgery

## 2013-10-24 ENCOUNTER — Ambulatory Visit (INDEPENDENT_AMBULATORY_CARE_PROVIDER_SITE_OTHER): Payer: 59 | Admitting: General Surgery

## 2013-10-24 ENCOUNTER — Encounter: Payer: Self-pay | Admitting: General Surgery

## 2013-10-24 VITALS — BP 114/70 | HR 70 | Resp 12 | Ht 72.0 in | Wt 191.0 lb

## 2013-10-24 DIAGNOSIS — K625 Hemorrhage of anus and rectum: Secondary | ICD-10-CM

## 2013-10-24 LAB — POC HEMOCCULT BLD/STL (OFFICE/1-CARD/DIAGNOSTIC): OCCULT BLOOD DATE: NEGATIVE

## 2013-10-24 NOTE — Progress Notes (Signed)
Patient ID: Ryan Blevins, male   DOB: [birth year], 63 y.o.   MRN: [id number]  Chief Complaint  Patient presents with  . Rectal Bleeding    HPI Ryan Blevins is a 63 y.o. male.  here today for complaints of rectal bleeding referred by Dr. Elsie Stain.  Last colonoscopy was 2010. Diarrhea is random occurs about 2 or 3 days then one month later it will come back. Bowels move daily. Rectal bleeding in toilet bowel or on tissue is noticed maybe 4 times in a month. Two episodes where he woke up with it on bed sheets. HPI  Past Medical History  Diagnosis Date  . Hypertension   . Hyperlipidemia   . Depression   . Gout   . Low testosterone 2011  . Syncope   . Prostate cancer 2011    per Dr. Risa Grill.  Seed implant per Dr. Valere Dross  . Skin cancer   . Hemorrhoid     Past Surgical History  Procedure Laterality Date  . Colonoscopy  2010    Dr Bary Castilla    Family History  Problem Relation Age of Onset  . Heart disease Mother     Multiple heart surgeries, valve repair  . Osteoporosis Mother     Vertebral fracture  . Hypertension Father   . Hyperlipidemia Father   . Diabetes Father   . Colon cancer Neg Hx     Social History History  Substance Use Topics  . Smoking status: Never Smoker   . Smokeless tobacco: Never Used  . Alcohol Use: Yes     Comment: Occasional    Allergies  Allergen Reactions  . Atorvastatin     REACTION: but tolerant of crestor- this is the only statin he tolerates  . Bupropion Hcl     REACTION: mood changes  . Codeine     Current Outpatient Prescriptions  Medication Sig Dispense Refill  . CIALIS 5 MG tablet TAKE 1 TABLET BY MOUTH EVERY DAY AS NEEDED  30 tablet  0  . lisinopril (PRINIVIL,ZESTRIL) 30 MG tablet Take 1 tablet (30 mg total) by mouth daily.  90 tablet  3  . rosuvastatin (CRESTOR) 10 MG tablet Take 1 tablet (10 mg total) by mouth daily.  90 tablet  3  . tamsulosin (FLOMAX) 0.4 MG CAPS capsule Take 0.4 mg by mouth.      . venlafaxine XR  (EFFEXOR-XR) 75 MG 24 hr capsule Take 2 capsules (150 mg total) by mouth daily.  180 capsule  3   No current facility-administered medications for this visit.    Review of Systems Review of Systems  Constitutional: Negative.   Respiratory: Negative.   Cardiovascular: Negative.   Gastrointestinal: Positive for diarrhea and blood in stool. Negative for nausea and vomiting.    Blood pressure 114/70, pulse 70, resp. rate 12, height 6' (1.829 m), weight 191 lb (86.637 kg).  Physical Exam Physical Exam  Constitutional: He is oriented to person, place, and time. He appears well-developed and well-nourished.  Neck: Neck supple.  Cardiovascular: Normal rate, regular rhythm and normal heart sounds.   Pulmonary/Chest: Effort normal and breath sounds normal.  Abdominal: Soft. Normal appearance and bowel sounds are normal.  Genitourinary: Prostate normal. Rectal exam shows internal hemorrhoid.  External anal exam showed no prolapsing tissue. Sphincter tone was normal. Anterior internal hemorrhoids approximately 1 cm in diameter were identified. No bleeding.  Anoscopy showed the visualized lower rectal mucosa be unremarkable. Brown stool was appreciated in the upper levels of the rectal vault.  Stool is Hemoccult negative.  Lymphadenopathy:    He has no cervical adenopathy.  Neurological: He is alert and oriented to person, place, and time.  Skin: Skin is warm and dry.    Data Reviewed Colonoscopy dated 02/20/2009 was entirely normal.  Assessment    Long history of intermittent bright red rectal bleeding likely secondary to an anorectal source. Episodes of nocturnal bleeding likely secondary to same.  The patient did have prostate cancer treated with radioactive seed placement, but there is no evidence of anterior rectal wall proctitis.    Plan    The patient did have prostate cancer treated with radioactive seed placement, but there is no evidence of anterior rectal wall proctitis.  I  don't see an indication for repeat colonoscopy at this time. The patient was encouraged to call should he have recurrent episodes of bleeding at which time we'll consider hemorrhoid banding.  Repeat colonoscopy in 2020, symptoms change.       Ryan Blevins 10/25/2013, 7:11 AM

## 2013-10-24 NOTE — Patient Instructions (Addendum)
The patient is aware to call back for any questions or concerns. Call for concerns of further bleeding.

## 2013-10-25 DIAGNOSIS — K625 Hemorrhage of anus and rectum: Secondary | ICD-10-CM | POA: Insufficient documentation

## 2013-11-03 ENCOUNTER — Encounter: Payer: Self-pay | Admitting: General Surgery

## 2013-11-15 ENCOUNTER — Telehealth: Payer: Self-pay | Admitting: Family Medicine

## 2013-11-15 NOTE — Telephone Encounter (Signed)
[facility] (Dr. Adolph Pollack) Pt went to the doctor and got a bill because he did not have a referral.  He states he has [employer].  Back of card says referral required for certain services he states. He would like for [place] to get a retro-referral  Please call pt [phone]

## 2013-11-15 NOTE — Telephone Encounter (Signed)
Working on [employer] two claims from 2 specialists.

## 2014-08-10 ENCOUNTER — Other Ambulatory Visit: Payer: Self-pay | Admitting: Family Medicine

## 2014-08-10 DIAGNOSIS — E785 Hyperlipidemia, unspecified: Secondary | ICD-10-CM

## 2014-08-10 DIAGNOSIS — Z8739 Personal history of other diseases of the musculoskeletal system and connective tissue: Secondary | ICD-10-CM

## 2014-08-11 ENCOUNTER — Other Ambulatory Visit (INDEPENDENT_AMBULATORY_CARE_PROVIDER_SITE_OTHER): Payer: 59

## 2014-08-11 DIAGNOSIS — Z8739 Personal history of other diseases of the musculoskeletal system and connective tissue: Secondary | ICD-10-CM

## 2014-08-11 DIAGNOSIS — Z8639 Personal history of other endocrine, nutritional and metabolic disease: Secondary | ICD-10-CM

## 2014-08-11 DIAGNOSIS — E785 Hyperlipidemia, unspecified: Secondary | ICD-10-CM

## 2014-08-11 LAB — COMPREHENSIVE METABOLIC PANEL
ALT: 20 U/L (ref 0–53)
AST: 23 U/L (ref 0–37)
Albumin: 4.1 g/dL (ref 3.5–5.2)
Alkaline Phosphatase: 66 U/L (ref 39–117)
BILIRUBIN TOTAL: 0.6 mg/dL (ref 0.2–1.2)
BUN: 14 mg/dL (ref 6–23)
CO2: 28 mEq/L (ref 19–32)
CREATININE: 1.1 mg/dL (ref 0.4–1.5)
Calcium: 9.1 mg/dL (ref 8.4–10.5)
Chloride: 103 mEq/L (ref 96–112)
GFR: 75.8 mL/min (ref >60.00)
Glucose, Bld: 103 mg/dL — ABNORMAL HIGH (ref 70–99)
Potassium: 4.6 mEq/L (ref 3.5–5.1)
Sodium: 137 mEq/L (ref 135–145)
Total Protein: 6.8 g/dL (ref 6.0–8.3)

## 2014-08-11 LAB — LIPID PANEL
CHOL/HDL RATIO: 5
Cholesterol: 166 mg/dL (ref 0–200)
HDL: 35.6 mg/dL — AB (ref >39.00)
LDL Cholesterol: 94 mg/dL (ref 0–99)
NONHDL: 130.4
TRIGLYCERIDES: 183 mg/dL — AB (ref 0.0–149.0)
VLDL: 36.6 mg/dL (ref 0.0–40.0)

## 2014-08-11 LAB — URIC ACID: Uric Acid, Serum: 7.9 mg/dL — ABNORMAL HIGH (ref 4.0–7.8)

## 2014-08-15 ENCOUNTER — Ambulatory Visit (INDEPENDENT_AMBULATORY_CARE_PROVIDER_SITE_OTHER): Payer: 59 | Admitting: Family Medicine

## 2014-08-15 ENCOUNTER — Encounter: Payer: Self-pay | Admitting: Family Medicine

## 2014-08-15 VITALS — BP 120/70 | HR 64 | Temp 98.4°F | Ht 72.0 in | Wt 199.0 lb

## 2014-08-15 DIAGNOSIS — Z23 Encounter for immunization: Secondary | ICD-10-CM

## 2014-08-15 DIAGNOSIS — E785 Hyperlipidemia, unspecified: Secondary | ICD-10-CM

## 2014-08-15 DIAGNOSIS — F3289 Other specified depressive episodes: Secondary | ICD-10-CM

## 2014-08-15 DIAGNOSIS — E790 Hyperuricemia without signs of inflammatory arthritis and tophaceous disease: Secondary | ICD-10-CM

## 2014-08-15 DIAGNOSIS — Z0000 Encounter for general adult medical examination without abnormal findings: Secondary | ICD-10-CM

## 2014-08-15 DIAGNOSIS — I10 Essential (primary) hypertension: Secondary | ICD-10-CM

## 2014-08-15 DIAGNOSIS — Z7189 Other specified counseling: Secondary | ICD-10-CM

## 2014-08-15 DIAGNOSIS — F328 Other depressive episodes: Secondary | ICD-10-CM

## 2014-08-15 DIAGNOSIS — C61 Malignant neoplasm of prostate: Secondary | ICD-10-CM

## 2014-08-15 MED ORDER — VENLAFAXINE HCL ER 75 MG PO CP24: 150.0000 mg | ORAL_CAPSULE | Freq: Every day | ORAL | Status: DC

## 2014-08-15 MED ORDER — TAMSULOSIN HCL 0.4 MG PO CAPS: 0.4000 mg | ORAL_CAPSULE | Freq: Every day | ORAL | Status: DC

## 2014-08-15 MED ORDER — LISINOPRIL 30 MG PO TABS: 30.0000 mg | ORAL_TABLET | Freq: Every day | ORAL | Status: DC

## 2014-08-15 MED ORDER — ROSUVASTATIN CALCIUM 10 MG PO TABS: 10.0000 mg | ORAL_TABLET | ORAL | Status: DC

## 2014-08-15 NOTE — Progress Notes (Signed)
Pre visit review using our clinic review tool, if applicable. No additional management support is needed unless otherwise documented below in the visit note.  CPE- See plan. Routine anticipatory guidance given to patient. See health maintenance. Tetanus 2010 PNA 2010 Flu shot today.  Shingles d/w pt. Encouraged.  Colonoscopy 2009. He has had some bleeding and had seen Dr. Dagoberto Reef, didn't need f/u per report at this time.  PSA per urology.  Diet and exercise d/w pt. Intentional weight loss prev. Had cut out breads and sweet drinks. No pork. Lean meat. Less exercise, but mainly diet work.  Wife designated if incapacitated.   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 Off allopurinol. Labs d/w pt. Intentional weight loss prev.   Hypertension:  Using medication without problems or lightheadedness: rarely lightheaded, resolved with slowly standing. Chest pain with exertion:no Edema:no Short of breath:no  Mood is okay. Work is busy. Has things to look forward to. Compliant with meds. No ADE on meds.   Prostate CA and ED per uro.   Elevated Cholesterol: Using medications without problems:yes, taken intermittently.   Muscle aches: no Diet compliance:yes Exercise:no, encouraged.  D/w pt about changing to MWF dosing.  He is agreeable.   PMH and SH reviewed  ROS: See HPI, otherwise noncontributory.  Meds, vitals, and allergies reviewed.   GEN: nad, alert and oriented HEENT: mucous membranes moist NECK: supple w/o LA CV: rrr.  no murmur PULM: ctab, no inc wob ABD: soft, +bs EXT: no edema SKIN: no acute rash

## 2014-08-15 NOTE — Patient Instructions (Addendum)
Check with your insurance to see if they will cover the shingles shot. Take care.  Glad to see you.  Recheck in about 1 year.   

## 2014-08-17 ENCOUNTER — Other Ambulatory Visit: Payer: Self-pay | Admitting: Family Medicine

## 2014-08-19 DIAGNOSIS — Z7189 Other specified counseling: Secondary | ICD-10-CM | POA: Insufficient documentation

## 2014-08-19 NOTE — Assessment & Plan Note (Signed)
Routine anticipatory guidance given to patient. See health maintenance.  Tetanus 2010  PNA 2010  Flu shot today.  Shingles d/w pt. Encouraged.  Colonoscopy 2009. He has had some bleeding and had seen Dr. Dagoberto Reef, didn't need f/u per report at this time.  PSA per urology.  Diet and exercise d/w pt. Intentional weight loss prev. Had cut out breads and sweet drinks. No pork. Lean meat. Less exercise, but mainly diet work.  Wife designated if incapacitated.

## 2014-08-19 NOTE — Assessment & Plan Note (Signed)
Per uro.

## 2014-08-19 NOTE — Assessment & Plan Note (Signed)
Off allopurinol w/o flares, continue healthy diet and exercise.  Labs d/w pt.  If he has flares, then we can consider restarting proph meds.

## 2014-08-19 NOTE — Assessment & Plan Note (Signed)
Labs d/w pt.  Reasonable to change to MWF statin dosing.  He agrees.

## 2014-08-19 NOTE — Assessment & Plan Note (Signed)
Controlled, continue current ACE dose.  Labs d/w pt.  Continue diet and exercise.

## 2014-08-19 NOTE — Assessment & Plan Note (Signed)
Controlled, continue as is.  Doing well overall on meds.  No reason to change meds at this point.  He agrees.

## 2015-03-24 ENCOUNTER — Telehealth: Payer: Self-pay | Admitting: Family Medicine

## 2015-03-25 NOTE — Telephone Encounter (Signed)
error 

## 2015-06-25 ENCOUNTER — Telehealth: Payer: Self-pay

## 2015-06-25 NOTE — Telephone Encounter (Signed)
Pt left note requesting refill on all meds until CPX in 07/2015. Spoke with Rocky Crafts at [facility]; lisinopril,crestor,tamsulosin and venlafaxine already for pick up now. Pt notified and voiced understanding; pt will go to pharmacy to pick up meds.

## 2015-08-09 ENCOUNTER — Other Ambulatory Visit: Payer: Self-pay | Admitting: Family Medicine

## 2015-08-09 DIAGNOSIS — E785 Hyperlipidemia, unspecified: Secondary | ICD-10-CM

## 2015-08-09 DIAGNOSIS — E790 Hyperuricemia without signs of inflammatory arthritis and tophaceous disease: Secondary | ICD-10-CM

## 2015-08-11 ENCOUNTER — Other Ambulatory Visit (INDEPENDENT_AMBULATORY_CARE_PROVIDER_SITE_OTHER): Payer: 59

## 2015-08-11 DIAGNOSIS — E785 Hyperlipidemia, unspecified: Secondary | ICD-10-CM | POA: Diagnosis not present

## 2015-08-11 DIAGNOSIS — E790 Hyperuricemia without signs of inflammatory arthritis and tophaceous disease: Secondary | ICD-10-CM

## 2015-08-11 LAB — COMPREHENSIVE METABOLIC PANEL
ALT: 24 U/L (ref 0–53)
AST: 23 U/L (ref 0–37)
Albumin: 4.6 g/dL (ref 3.5–5.2)
Alkaline Phosphatase: 76 U/L (ref 39–117)
BILIRUBIN TOTAL: 0.6 mg/dL (ref 0.2–1.2)
BUN: 13 mg/dL (ref 6–23)
CO2: 29 meq/L (ref 19–32)
CREATININE: 1.07 mg/dL (ref 0.40–1.50)
Calcium: 10.3 mg/dL (ref 8.4–10.5)
Chloride: 101 mEq/L (ref 96–112)
GFR: 73.93 mL/min (ref >60.00)
GLUCOSE: 94 mg/dL (ref 70–99)
Potassium: 4.9 mEq/L (ref 3.5–5.1)
Sodium: 137 mEq/L (ref 135–145)
TOTAL PROTEIN: 7.9 g/dL (ref 6.0–8.3)

## 2015-08-11 LAB — LIPID PANEL
Cholesterol: 172 mg/dL (ref 0–200)
HDL: 48.9 mg/dL (ref >39.00)
LDL Cholesterol: 97 mg/dL (ref 0–99)
NONHDL: 122.95
Total CHOL/HDL Ratio: 4
Triglycerides: 129 mg/dL (ref 0.0–149.0)
VLDL: 25.8 mg/dL (ref 0.0–40.0)

## 2015-08-11 LAB — URIC ACID: Uric Acid, Serum: 7.7 mg/dL (ref 4.0–7.8)

## 2015-08-12 ENCOUNTER — Other Ambulatory Visit: Payer: 59

## 2015-08-17 ENCOUNTER — Encounter: Payer: Self-pay | Admitting: Family Medicine

## 2015-08-17 ENCOUNTER — Telehealth: Payer: Self-pay | Admitting: Family Medicine

## 2015-08-17 ENCOUNTER — Ambulatory Visit (INDEPENDENT_AMBULATORY_CARE_PROVIDER_SITE_OTHER): Payer: 59 | Admitting: Family Medicine

## 2015-08-17 VITALS — BP 132/86 | HR 88 | Temp 97.7°F | Ht 72.0 in | Wt 209.0 lb

## 2015-08-17 DIAGNOSIS — R1011 Right upper quadrant pain: Secondary | ICD-10-CM | POA: Diagnosis not present

## 2015-08-17 DIAGNOSIS — Z0000 Encounter for general adult medical examination without abnormal findings: Secondary | ICD-10-CM

## 2015-08-17 DIAGNOSIS — F3342 Major depressive disorder, recurrent, in full remission: Secondary | ICD-10-CM

## 2015-08-17 DIAGNOSIS — Z23 Encounter for immunization: Secondary | ICD-10-CM

## 2015-08-17 DIAGNOSIS — E785 Hyperlipidemia, unspecified: Secondary | ICD-10-CM

## 2015-08-17 DIAGNOSIS — Z119 Encounter for screening for infectious and parasitic diseases, unspecified: Secondary | ICD-10-CM

## 2015-08-17 DIAGNOSIS — I10 Essential (primary) hypertension: Secondary | ICD-10-CM

## 2015-08-17 DIAGNOSIS — E790 Hyperuricemia without signs of inflammatory arthritis and tophaceous disease: Secondary | ICD-10-CM

## 2015-08-17 MED ORDER — ROSUVASTATIN CALCIUM 10 MG PO TABS: 10.0000 mg | ORAL_TABLET | ORAL | Status: DC

## 2015-08-17 MED ORDER — LISINOPRIL 30 MG PO TABS: 30.0000 mg | ORAL_TABLET | Freq: Every day | ORAL | Status: DC

## 2015-08-17 MED ORDER — VENLAFAXINE HCL ER 75 MG PO CP24: 150.0000 mg | ORAL_CAPSULE | Freq: Every day | ORAL | Status: DC

## 2015-08-17 MED ORDER — TADALAFIL 5 MG PO TABS: 5.0000 mg | ORAL_TABLET | Freq: Every day | ORAL | Status: DC | PRN

## 2015-08-17 MED ORDER — TAMSULOSIN HCL 0.4 MG PO CAPS: 0.4000 mg | ORAL_CAPSULE | Freq: Every day | ORAL | Status: DC

## 2015-08-17 NOTE — Progress Notes (Signed)
Pre visit review using our clinic review tool, if applicable. No additional management support is needed unless otherwise documented below in the visit note.  CPE- See plan.  Routine anticipatory guidance given to patient.  See health maintenance. Tetanus 2010 PNA 2010 Flu shot today.  Shingles d/w pt. Encouraged.  Colonoscopy 2010.  He does have bleeding hemorrhoids and has d/w Dr. Terri Piedra.   PSA per urology.  Diet and exercise d/w pt. Intentional weight loss prev. Had cut out breads and sweet drinks prev.  No pork.  Lean meat. Less exercise, but mainly diet work.  Wife designated if incapacitated.  Pt opts in for HCV screening.  D/w pt re: routine screening.   HIV neg ~1995 per patient report.    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 Off allopurinol. Labs d/w pt. Intentional weight loss prev.   Hypertension: Using medication without problems or lightheadedness: yes Chest pain with exertion:no Edema:no Short of breath:no Hasn't checked BP out of clinic recently.    Mood is okay. Work is busy. He is caring for his mother, who is now on hospice. Compliant with meds. No ADE on meds.   Prostate CA and ED per uro.   Elevated Cholesterol: Using medications without problems:yes, taken intermittently.  Muscle aches: no Diet compliance:yes Exercise:no, encouraged.  Does MWF dosing. no ADE with current med.   He has some RUQ abd pain, not exertional chest pain.  Can occ be in the RLQ or LUQ, occ in the R side of his back.  Father had gall bladder hx, as did his mother.    PMH and SH reviewed  Meds, vitals, and allergies reviewed.   ROS: See HPI.  Otherwise negative.    GEN: nad, alert and oriented HEENT: mucous membranes moist NECK: supple w/o LA CV: rrr. PULM: ctab, no inc wob ABD: soft, +bs, no ttp, no rebound EXT: no edema SKIN: no acute rash

## 2015-08-17 NOTE — Patient Instructions (Addendum)
Check with your insurance to see if they will cover the shingles shot. Ryan Blevins will call about your referral. Take care.  Glad to see you.

## 2015-08-17 NOTE — Telephone Encounter (Signed)
Patient asked me to request waiting until after January 1st to have this Ultrasound done as his insurance plan will be changing on that date. We will wait on scheduling this until January 2017 when the patient calls [place] back. Ryan Blevins

## 2015-08-17 NOTE — Telephone Encounter (Signed)
Noted. Thanks.

## 2015-08-18 NOTE — Assessment & Plan Note (Signed)
Tetanus 2010 PNA 2010 Flu shot today.  Shingles d/w pt. Encouraged.  Colonoscopy 2010.  He does have bleeding hemorrhoids and has d/w Dr. Terri Piedra.   PSA per urology.  Diet and exercise d/w pt. Intentional weight loss prev. Had cut out breads and sweet drinks prev.  No pork.  Lean meat. Less exercise, but mainly diet work.  Wife designated if incapacitated.  Pt opts in for HCV screening.  D/w pt re: routine screening.   HIV neg ~1995 per patient report.

## 2015-08-18 NOTE — Assessment & Plan Note (Signed)
Reasonable control, continue as is.  He agrees.  Labs d/w pt.   

## 2015-08-18 NOTE — Assessment & Plan Note (Signed)
No sx now, would avoid fatty foods with concern for GB pathology.  No acute issues now, can get outpatient u/s done.  D/w pt.  He agrees.

## 2015-08-18 NOTE — Assessment & Plan Note (Signed)
In remission, would continue current meds.  He agrees.  Okay for outpatient f/u.

## 2015-08-18 NOTE — Assessment & Plan Note (Signed)
No flares of gout off med, continue as is.

## 2015-08-18 NOTE — Assessment & Plan Note (Signed)
Reasonable control, continue as is.  He agrees.  Labs d/w pt.  Recheck BP okay.

## 2015-08-19 ENCOUNTER — Other Ambulatory Visit: Payer: Self-pay | Admitting: Family Medicine

## 2015-08-19 DIAGNOSIS — D239 Other benign neoplasm of skin, unspecified: Secondary | ICD-10-CM

## 2015-08-19 HISTORY — DX: Other benign neoplasm of skin, unspecified: D23.9

## 2015-08-21 ENCOUNTER — Other Ambulatory Visit: Payer: Self-pay | Admitting: Family Medicine

## 2015-10-27 ENCOUNTER — Ambulatory Visit
Admission: RE | Admit: 2015-10-27 | Discharge: 2015-10-27 | Disposition: A | Discharge status: Home / Self Care | Payer: BLUE CROSS/BLUE SHIELD | Source: Ambulatory Visit | Attending: Family Medicine | Admitting: Family Medicine

## 2015-10-27 DIAGNOSIS — R1011 Right upper quadrant pain: Secondary | ICD-10-CM

## 2015-10-29 ENCOUNTER — Other Ambulatory Visit: Payer: Self-pay | Admitting: Family Medicine

## 2015-10-29 DIAGNOSIS — N289 Disorder of kidney and ureter, unspecified: Secondary | ICD-10-CM

## 2015-10-30 ENCOUNTER — Other Ambulatory Visit: Payer: Self-pay | Admitting: Family Medicine

## 2015-10-30 DIAGNOSIS — N289 Disorder of kidney and ureter, unspecified: Secondary | ICD-10-CM

## 2015-10-30 NOTE — Progress Notes (Unsigned)
Order corrected.  Old orders cancelled. Thanks.

## 2015-11-02 ENCOUNTER — Ambulatory Visit
Admission: RE | Admit: 2015-11-02 | Discharge: 2015-11-02 | Disposition: A | Discharge status: Home / Self Care | Payer: BLUE CROSS/BLUE SHIELD | Source: Ambulatory Visit | Attending: Family Medicine | Admitting: Family Medicine

## 2015-11-02 DIAGNOSIS — N289 Disorder of kidney and ureter, unspecified: Secondary | ICD-10-CM

## 2015-11-02 MED ORDER — GADOBENATE DIMEGLUMINE 529 MG/ML IV SOLN
20.0000 mL | Freq: Once | INTRAVENOUS | Status: AC | PRN
  Administered 2015-11-02: 20 mL via INTRAVENOUS

## 2016-07-11 DIAGNOSIS — Z8546 Personal history of malignant neoplasm of prostate: Secondary | ICD-10-CM | POA: Diagnosis not present

## 2016-07-11 DIAGNOSIS — N5201 Erectile dysfunction due to arterial insufficiency: Secondary | ICD-10-CM | POA: Diagnosis not present

## 2016-07-15 ENCOUNTER — Other Ambulatory Visit (INDEPENDENT_AMBULATORY_CARE_PROVIDER_SITE_OTHER): Payer: PPO

## 2016-07-15 ENCOUNTER — Other Ambulatory Visit: Payer: Self-pay | Admitting: Family Medicine

## 2016-07-15 DIAGNOSIS — E785 Hyperlipidemia, unspecified: Secondary | ICD-10-CM | POA: Diagnosis not present

## 2016-07-15 DIAGNOSIS — Z119 Encounter for screening for infectious and parasitic diseases, unspecified: Secondary | ICD-10-CM

## 2016-07-15 DIAGNOSIS — Z125 Encounter for screening for malignant neoplasm of prostate: Secondary | ICD-10-CM

## 2016-07-15 DIAGNOSIS — E790 Hyperuricemia without signs of inflammatory arthritis and tophaceous disease: Secondary | ICD-10-CM | POA: Diagnosis not present

## 2016-07-15 DIAGNOSIS — R7989 Other specified abnormal findings of blood chemistry: Secondary | ICD-10-CM | POA: Diagnosis not present

## 2016-07-15 LAB — LIPID PANEL
Cholesterol: 205 mg/dL — ABNORMAL HIGH (ref 0–200)
HDL: 36.7 mg/dL — ABNORMAL LOW (ref >39.00)
NonHDL: 168.3
Total CHOL/HDL Ratio: 6
Triglycerides: 228 mg/dL — ABNORMAL HIGH (ref 0.0–149.0)
VLDL: 45.6 mg/dL — ABNORMAL HIGH (ref 0.0–40.0)

## 2016-07-15 LAB — COMPREHENSIVE METABOLIC PANEL
ALK PHOS: 73 U/L (ref 39–117)
ALT: 31 U/L (ref 0–53)
AST: 26 U/L (ref 0–37)
Albumin: 4.5 g/dL (ref 3.5–5.2)
BILIRUBIN TOTAL: 0.5 mg/dL (ref 0.2–1.2)
BUN: 17 mg/dL (ref 6–23)
CO2: 29 mEq/L (ref 19–32)
CREATININE: 1.21 mg/dL (ref 0.40–1.50)
Calcium: 10.1 mg/dL (ref 8.4–10.5)
Chloride: 103 mEq/L (ref 96–112)
GFR: 63.96 mL/min (ref >60.00)
GLUCOSE: 102 mg/dL — AB (ref 70–99)
Potassium: 5 mEq/L (ref 3.5–5.1)
SODIUM: 139 meq/L (ref 135–145)
TOTAL PROTEIN: 7.3 g/dL (ref 6.0–8.3)

## 2016-07-15 LAB — LDL CHOLESTEROL, DIRECT: Direct LDL: 133 mg/dL

## 2016-07-15 LAB — URIC ACID: URIC ACID, SERUM: 9.3 mg/dL — AB (ref 4.0–7.8)

## 2016-07-15 NOTE — Addendum Note (Signed)
Addended by: Ellamae Sia on: 07/15/2016 02:05 PM   Modules accepted: Orders

## 2016-07-15 NOTE — Addendum Note (Signed)
Addended by: Ellamae Sia on: 07/15/2016 10:56 AM   Modules accepted: Orders

## 2016-07-16 LAB — HEPATITIS C ANTIBODY: HCV AB: NEGATIVE

## 2016-07-18 LAB — PSA

## 2016-07-19 ENCOUNTER — Encounter: Payer: Self-pay | Admitting: Family Medicine

## 2016-08-08 ENCOUNTER — Other Ambulatory Visit: Payer: Self-pay | Admitting: Family Medicine

## 2016-08-14 ENCOUNTER — Other Ambulatory Visit: Payer: Self-pay | Admitting: Family Medicine

## 2016-08-15 ENCOUNTER — Other Ambulatory Visit: Payer: 59

## 2016-08-19 ENCOUNTER — Encounter: Payer: Self-pay | Admitting: Family Medicine

## 2016-08-19 ENCOUNTER — Ambulatory Visit (INDEPENDENT_AMBULATORY_CARE_PROVIDER_SITE_OTHER): Payer: PPO | Admitting: Family Medicine

## 2016-08-19 VITALS — BP 142/78 | HR 76 | Temp 98.4°F | Ht 72.0 in | Wt 210.0 lb

## 2016-08-19 DIAGNOSIS — Z23 Encounter for immunization: Secondary | ICD-10-CM | POA: Diagnosis not present

## 2016-08-19 DIAGNOSIS — E785 Hyperlipidemia, unspecified: Secondary | ICD-10-CM

## 2016-08-19 DIAGNOSIS — Z0000 Encounter for general adult medical examination without abnormal findings: Secondary | ICD-10-CM | POA: Diagnosis not present

## 2016-08-19 DIAGNOSIS — E790 Hyperuricemia without signs of inflammatory arthritis and tophaceous disease: Secondary | ICD-10-CM

## 2016-08-19 DIAGNOSIS — F3342 Major depressive disorder, recurrent, in full remission: Secondary | ICD-10-CM

## 2016-08-19 DIAGNOSIS — C61 Malignant neoplasm of prostate: Secondary | ICD-10-CM

## 2016-08-19 DIAGNOSIS — I10 Essential (primary) hypertension: Secondary | ICD-10-CM

## 2016-08-19 MED ORDER — TAMSULOSIN HCL 0.4 MG PO CAPS: 0.4000 mg | ORAL_CAPSULE | Freq: Every day | ORAL | 3 refills | Status: DC

## 2016-08-19 MED ORDER — SILDENAFIL CITRATE 20 MG PO TABS: 60.0000 mg | ORAL_TABLET | Freq: Every day | ORAL | 99 refills | Status: DC | PRN

## 2016-08-19 MED ORDER — LISINOPRIL 30 MG PO TABS: ORAL_TABLET | ORAL | 3 refills | Status: DC

## 2016-08-19 MED ORDER — ROSUVASTATIN CALCIUM 10 MG PO TABS: 10.0000 mg | ORAL_TABLET | Freq: Every day | ORAL | 3 refills | Status: DC

## 2016-08-19 MED ORDER — VENLAFAXINE HCL ER 75 MG PO CP24: 150.0000 mg | ORAL_CAPSULE | Freq: Every day | ORAL | 3 refills | Status: DC

## 2016-08-19 NOTE — Progress Notes (Signed)
I have personally reviewed the Medicare Annual Wellness questionnaire and have noted 1. The patient's medical and social history 2. Their use of alcohol, tobacco or illicit drugs 3. Their current medications and supplements 4. The patient's functional ability including ADL's, fall risks, home safety risks and hearing or visual             impairment. 5. Diet and physical activities 6. Evidence for depression or mood disorders  The patients weight, height, BMI have been recorded in the chart and visual acuity is per eye clinic.  I have made referrals, counseling and provided education to the patient based review of the above and I have provided the pt with a written personalized care plan for preventive services.  Provider list updated- see scanned forms.  Routine anticipatory guidance given to patient.  See health maintenance.  Flu today Shingles d/w pt PNA 2017 Tetanus 2010 Colonoscopy 2010 PSA 0, d/w pt.  Has seen urology.  Advance directive- wife designated if patient were incapacitated.   Cognitive function addressed- see scanned forms- and if abnormal then additional documentation follows.  Prev EKG unremarkable.   No AAA seen on u/s this year.    Elevated Cholesterol: Using medications without problems:yes Muscle aches: no Diet compliance: yes Exercise: encouraged more, d/w pt.  D/w pt about med dosing.  See plan.   Hypertension:    Using medication without problems or lightheadedness: yes Chest pain with exertion:no Edema:no Short of breath:no  Mood is good o/w.  No SI/HI.  No ADE on med.  Sleeping okay.    Uric acid up but no recent gout flares.    PMH and SH reviewed  Meds, vitals, and allergies reviewed.   ROS: Per HPI.  Unless specifically indicated otherwise in HPI, the patient denies:  General: fever. Eyes: acute vision changes ENT: sore throat Cardiovascular: chest pain Respiratory: SOB GI: vomiting GU: dysuria Musculoskeletal: acute back  pain Derm: acute rash Neuro: acute motor dysfunction Psych: worsening mood Endocrine: polydipsia Heme: bleeding Allergy: hayfever  GEN: nad, alert and oriented HEENT: mucous membranes moist NECK: supple w/o LA CV: rrr. PULM: ctab, no inc wob ABD: soft, +bs EXT: no edema SKIN: no acute rash

## 2016-08-19 NOTE — Progress Notes (Signed)
Pre visit review using our clinic review tool, if applicable. No additional management support is needed unless otherwise documented below in the visit note. 

## 2016-08-19 NOTE — Patient Instructions (Addendum)
Check with your insurance to see if they will cover the shingles shot. Try to take the crestor daily if tolerated and recheck labs in about 2 months.   Work on diet and exercise in the meantime.  Take care.  Glad to see you.  Update me as needed.

## 2016-08-21 NOTE — Assessment & Plan Note (Signed)
PSA 0. D/w pt.

## 2016-08-21 NOTE — Assessment & Plan Note (Signed)
Change statin to daily dosing and recheck in about 2-3 months.  Work on diet and exercise.  He agrees.

## 2016-08-21 NOTE — Assessment & Plan Note (Signed)
No recent flares, continue as is.  D/w pt.  He agrees.

## 2016-08-21 NOTE — Assessment & Plan Note (Signed)
Mood is good, continue as is.  He agrees.

## 2016-08-21 NOTE — Assessment & Plan Note (Signed)
Flu today Shingles d/w pt PNA 2017 Tetanus 2010 Colonoscopy 2010 PSA 0, d/w pt.  Has seen urology.  Advance directive- wife designated if patient were incapacitated.   Cognitive function addressed- see scanned forms- and if abnormal then additional documentation follows.  Prev EKG unremarkable.   No AAA seen on u/s this year.

## 2016-08-21 NOTE — Assessment & Plan Note (Signed)
Reasonable control, continue as is.  

## 2016-08-25 DIAGNOSIS — L570 Actinic keratosis: Secondary | ICD-10-CM | POA: Diagnosis not present

## 2016-08-25 DIAGNOSIS — L578 Other skin changes due to chronic exposure to nonionizing radiation: Secondary | ICD-10-CM | POA: Diagnosis not present

## 2016-08-25 DIAGNOSIS — D692 Other nonthrombocytopenic purpura: Secondary | ICD-10-CM | POA: Diagnosis not present

## 2016-08-25 DIAGNOSIS — L812 Freckles: Secondary | ICD-10-CM | POA: Diagnosis not present

## 2016-08-25 DIAGNOSIS — L821 Other seborrheic keratosis: Secondary | ICD-10-CM | POA: Diagnosis not present

## 2016-08-30 ENCOUNTER — Other Ambulatory Visit: Payer: Self-pay | Admitting: *Deleted

## 2016-08-30 MED ORDER — SILDENAFIL CITRATE 20 MG PO TABS: 60.0000 mg | ORAL_TABLET | Freq: Every day | ORAL | 99 refills | Status: DC | PRN

## 2016-09-08 ENCOUNTER — Other Ambulatory Visit: Payer: Self-pay | Admitting: Family Medicine

## 2016-10-12 DIAGNOSIS — L578 Other skin changes due to chronic exposure to nonionizing radiation: Secondary | ICD-10-CM | POA: Diagnosis not present

## 2016-10-12 DIAGNOSIS — L570 Actinic keratosis: Secondary | ICD-10-CM | POA: Diagnosis not present

## 2016-11-09 ENCOUNTER — Other Ambulatory Visit: Payer: Self-pay | Admitting: Family Medicine

## 2016-11-14 ENCOUNTER — Other Ambulatory Visit (INDEPENDENT_AMBULATORY_CARE_PROVIDER_SITE_OTHER): Payer: PPO

## 2016-11-14 ENCOUNTER — Other Ambulatory Visit: Payer: Self-pay | Admitting: Family Medicine

## 2016-11-14 DIAGNOSIS — E785 Hyperlipidemia, unspecified: Secondary | ICD-10-CM

## 2016-11-14 LAB — HEPATIC FUNCTION PANEL
ALK PHOS: 58 U/L (ref 39–117)
ALT: 26 U/L (ref 0–53)
AST: 22 U/L (ref 0–37)
Albumin: 4.2 g/dL (ref 3.5–5.2)
BILIRUBIN DIRECT: 0.1 mg/dL (ref 0.0–0.3)
BILIRUBIN TOTAL: 0.5 mg/dL (ref 0.2–1.2)
TOTAL PROTEIN: 7 g/dL (ref 6.0–8.3)

## 2016-11-14 LAB — LIPID PANEL
CHOL/HDL RATIO: 4
CHOLESTEROL: 136 mg/dL (ref 0–200)
HDL: 36.8 mg/dL — ABNORMAL LOW (ref >39.00)
LDL Cholesterol: 60 mg/dL (ref 0–99)
NonHDL: 98.74
TRIGLYCERIDES: 196 mg/dL — AB (ref 0.0–149.0)
VLDL: 39.2 mg/dL (ref 0.0–40.0)

## 2016-11-21 ENCOUNTER — Other Ambulatory Visit: Payer: Self-pay | Admitting: *Deleted

## 2016-11-21 MED ORDER — LISINOPRIL 30 MG PO TABS: ORAL_TABLET | ORAL | 2 refills | Status: DC

## 2016-11-30 DIAGNOSIS — L570 Actinic keratosis: Secondary | ICD-10-CM | POA: Diagnosis not present

## 2016-11-30 DIAGNOSIS — L578 Other skin changes due to chronic exposure to nonionizing radiation: Secondary | ICD-10-CM | POA: Diagnosis not present

## 2016-12-12 DIAGNOSIS — L249 Irritant contact dermatitis, unspecified cause: Secondary | ICD-10-CM | POA: Diagnosis not present

## 2017-03-20 DIAGNOSIS — L821 Other seborrheic keratosis: Secondary | ICD-10-CM | POA: Diagnosis not present

## 2017-03-20 DIAGNOSIS — Z85828 Personal history of other malignant neoplasm of skin: Secondary | ICD-10-CM | POA: Diagnosis not present

## 2017-03-20 DIAGNOSIS — D229 Melanocytic nevi, unspecified: Secondary | ICD-10-CM | POA: Diagnosis not present

## 2017-03-20 DIAGNOSIS — D485 Neoplasm of uncertain behavior of skin: Secondary | ICD-10-CM | POA: Diagnosis not present

## 2017-03-20 DIAGNOSIS — L570 Actinic keratosis: Secondary | ICD-10-CM | POA: Diagnosis not present

## 2017-03-20 DIAGNOSIS — L578 Other skin changes due to chronic exposure to nonionizing radiation: Secondary | ICD-10-CM | POA: Diagnosis not present

## 2017-03-20 DIAGNOSIS — L219 Seborrheic dermatitis, unspecified: Secondary | ICD-10-CM | POA: Diagnosis not present

## 2017-03-20 DIAGNOSIS — Z1283 Encounter for screening for malignant neoplasm of skin: Secondary | ICD-10-CM | POA: Diagnosis not present

## 2017-03-20 DIAGNOSIS — L812 Freckles: Secondary | ICD-10-CM | POA: Diagnosis not present

## 2017-03-20 DIAGNOSIS — L820 Inflamed seborrheic keratosis: Secondary | ICD-10-CM | POA: Diagnosis not present

## 2017-06-26 DIAGNOSIS — L570 Actinic keratosis: Secondary | ICD-10-CM | POA: Diagnosis not present

## 2017-06-26 DIAGNOSIS — L578 Other skin changes due to chronic exposure to nonionizing radiation: Secondary | ICD-10-CM | POA: Diagnosis not present

## 2017-06-26 DIAGNOSIS — L821 Other seborrheic keratosis: Secondary | ICD-10-CM | POA: Diagnosis not present

## 2017-06-26 DIAGNOSIS — L820 Inflamed seborrheic keratosis: Secondary | ICD-10-CM | POA: Diagnosis not present

## 2017-07-26 ENCOUNTER — Encounter (HOSPITAL_COMMUNITY): Payer: Self-pay | Admitting: *Deleted

## 2017-07-26 ENCOUNTER — Emergency Department (HOSPITAL_COMMUNITY)
Admission: EM | Admit: 2017-07-26 | Discharge: 2017-07-26 | Disposition: A | Discharge status: Home / Self Care | Payer: PPO | Attending: Emergency Medicine | Admitting: Emergency Medicine

## 2017-07-26 DIAGNOSIS — I10 Essential (primary) hypertension: Secondary | ICD-10-CM | POA: Diagnosis not present

## 2017-07-26 DIAGNOSIS — Z79899 Other long term (current) drug therapy: Secondary | ICD-10-CM | POA: Insufficient documentation

## 2017-07-26 DIAGNOSIS — R42 Dizziness and giddiness: Secondary | ICD-10-CM | POA: Diagnosis present

## 2017-07-26 DIAGNOSIS — H8112 Benign paroxysmal vertigo, left ear: Secondary | ICD-10-CM

## 2017-07-26 LAB — URINALYSIS, ROUTINE W REFLEX MICROSCOPIC
BILIRUBIN URINE: NEGATIVE
GLUCOSE, UA: NEGATIVE mg/dL
HGB URINE DIPSTICK: NEGATIVE
KETONES UR: NEGATIVE mg/dL
LEUKOCYTES UA: NEGATIVE
Nitrite: NEGATIVE
PH: 7 (ref 5.0–8.0)
Protein, ur: NEGATIVE mg/dL
Specific Gravity, Urine: 1.006 (ref 1.005–1.030)

## 2017-07-26 LAB — CBC WITH DIFFERENTIAL/PLATELET
Basophils Absolute: 0 10*3/uL (ref 0.0–0.1)
Basophils Relative: 1 %
EOS PCT: 3 %
Eosinophils Absolute: 0.2 10*3/uL (ref 0.0–0.7)
HEMATOCRIT: 44 % (ref 39.0–52.0)
Hemoglobin: 15 g/dL (ref 13.0–17.0)
LYMPHS PCT: 31 %
Lymphs Abs: 1.6 10*3/uL (ref 0.7–4.0)
MCH: 30.9 pg (ref 26.0–34.0)
MCHC: 34.1 g/dL (ref 30.0–36.0)
MCV: 90.7 fL (ref 78.0–100.0)
MONO ABS: 0.5 10*3/uL (ref 0.1–1.0)
MONOS PCT: 9 %
NEUTROS ABS: 2.9 10*3/uL (ref 1.7–7.7)
Neutrophils Relative %: 56 %
PLATELETS: 237 10*3/uL (ref 150–400)
RBC: 4.85 MIL/uL (ref 4.22–5.81)
RDW: 13.2 % (ref 11.5–15.5)
WBC: 5.2 10*3/uL (ref 4.0–10.5)

## 2017-07-26 LAB — BASIC METABOLIC PANEL
Anion gap: 8 (ref 5–15)
BUN: 12 mg/dL (ref 6–20)
CALCIUM: 9.2 mg/dL (ref 8.9–10.3)
CO2: 26 mmol/L (ref 22–32)
Chloride: 104 mmol/L (ref 101–111)
Creatinine, Ser: 1.24 mg/dL (ref 0.61–1.24)
GFR calc Af Amer: >60 mL/min (ref >60)
GFR calc non Af Amer: 59 mL/min — ABNORMAL LOW (ref >60)
GLUCOSE: 109 mg/dL — AB (ref 65–99)
Potassium: 4 mmol/L (ref 3.5–5.1)
Sodium: 138 mmol/L (ref 135–145)

## 2017-07-26 MED ORDER — MECLIZINE HCL 25 MG PO TABS
25.0000 mg | ORAL_TABLET | Freq: Once | ORAL | Status: AC
  Administered 2017-07-26: 25 mg via ORAL
  Filled 2017-07-26: qty 1

## 2017-07-26 MED ORDER — DIAZEPAM 5 MG PO TABS
5.0000 mg | ORAL_TABLET | Freq: Once | ORAL | Status: AC
  Administered 2017-07-26: 5 mg via ORAL
  Filled 2017-07-26: qty 1

## 2017-07-26 MED ORDER — MECLIZINE HCL 12.5 MG PO TABS: 12.5000 mg | ORAL_TABLET | Freq: Three times a day (TID) | ORAL | 0 refills | Status: DC | PRN

## 2017-07-26 MED ORDER — DIAZEPAM 5 MG PO TABS: 5.0000 mg | ORAL_TABLET | Freq: Four times a day (QID) | ORAL | 0 refills | Status: DC | PRN

## 2017-07-26 NOTE — Discharge Instructions (Signed)
Your dizziness is likely benign positional vertigo.  Please search and perform the "Epley Maneuver" as it is a technique to help aid the resolution of your condition.

## 2017-07-26 NOTE — ED Triage Notes (Signed)
Pt reports dizziness x 3 days. Denies HA or extremity weakness.

## 2017-07-26 NOTE — ED Provider Notes (Signed)
[facility] EMERGENCY DEPARTMENT Provider Note   CSN: [id number] Arrival date & time: 07/26/17  0708     History   Chief Complaint Chief Complaint  Patient presents with  . Dizziness    HPI Ryan Blevins is a 66 y.o. male.  HPI   66 year old male with history of prostate cancer, skin cancer, depression, hypertension and history of syncope presented to ER with complaints of dizziness.  Patient reports intermittent bouts of dizziness ongoing for the past 3 days.  Dizziness brought on with positional change especially when he leans over.  It does not happen all the time and sometimes he is completely symptom-free.  Dizziness is not associated with headache, vision changes, hearing loss, URI symptoms, fever or chills, confusion, chest pain shortness of breath focal numbness or weakness.  Initially he thought it may be sinus problem and took some sinus medication.  He also tried to clean his ear with eardrops.  Report having similar dizziness like this in the past.  No prior history of stroke.  Past Medical History:  Diagnosis Date  . Depression   . Hemorrhoid   . Hyperlipidemia   . Hypertension   . Hyperuricemia   . Low testosterone 2011  . Prostate cancer ([place]) 2011   per Dr. Risa Grill.  Seed implant per Dr. Valere Dross  . Skin cancer   . Syncope     Patient Active Problem List   Diagnosis Date Noted  . RUQ abdominal pain 08/17/2015  . Advance care planning 08/19/2014  . Rectal bleeding 10/25/2013  . Medicare welcome exam 06/10/2012  . Normal cardiac stress test 05/02/2011  . Syncope 03/10/2011  . Prostate cancer ([place]) 12/14/2010  . TESTICULAR HYPOFUNCTION 06/14/2010  . Hyperlipidemia 04/15/2010  . Hyperuricemia 04/15/2010  . MDD (major depressive disorder) 04/15/2010  . HYPERTENSION, BENIGN ESSENTIAL 04/15/2010  . LIBIDO, DECREASED 04/15/2010    Past Surgical History:  Procedure Laterality Date  . COLONOSCOPY  2010   Dr Bary Castilla       Home  Medications    Prior to Admission medications   Medication Sig Start Date End Date Taking? Authorizing Provider  lisinopril (PRINIVIL,ZESTRIL) 30 MG tablet TAKE 1 TABLET(30 MG) BY MOUTH DAILY 11/21/16   Tonia Ghent, MD  rosuvastatin (CRESTOR) 10 MG tablet Take 1 tablet (10 mg total) by mouth daily. 08/19/16   Tonia Ghent, MD  rosuvastatin (CRESTOR) 10 MG tablet TAKE 1 TABLET BY MOUTH EVERY MONDAY, [place], AND FRIDAY 09/09/16   Tonia Ghent, MD  sildenafil (REVATIO) 20 MG tablet Take 3-5 tablets (60-100 mg total) by mouth daily as needed. 08/30/16   Tonia Ghent, MD  tamsulosin (FLOMAX) 0.4 MG CAPS capsule Take 1 capsule (0.4 mg total) by mouth daily. 08/19/16   Tonia Ghent, MD  tamsulosin (FLOMAX) 0.4 MG CAPS capsule TAKE 1 CAPSULE BY MOUTH EVERY DAY 11/09/16   Tonia Ghent, MD  venlafaxine XR (EFFEXOR-XR) 75 MG 24 hr capsule Take 2 capsules (150 mg total) by mouth daily. 08/19/16   Tonia Ghent, MD    Family History Family History  Problem Relation Age of Onset  . Heart disease Mother        Multiple heart surgeries, valve repair  . Osteoporosis Mother        Vertebral fracture  . Stroke Mother   . Hypertension Father   . Hyperlipidemia Father   . Diabetes Father   . Parkinson's disease Father   . Colon cancer Neg Hx  Social History Social History   Tobacco Use  . Smoking status: Never Smoker  . Smokeless tobacco: Never Used  Substance Use Topics  . Alcohol use: Yes    Comment: Occasional  . Drug use: No     Allergies   Atorvastatin; Bupropion hcl; and Codeine   Review of Systems Review of Systems  All other systems reviewed and are negative.    Physical Exam Updated Vital Signs BP (!) 159/90 (BP Location: Right Arm)   Pulse 81   Temp 98.3 F (36.8 C) (Oral)   Resp 18   Ht 6' (1.829 m)   Wt 95.3 kg (210 lb)   SpO2 100%   BMI 28.48 kg/m   Physical Exam  Constitutional: He is oriented to person, place, and time. He appears  well-developed and well-nourished. No distress.  HENT:  Head: Atraumatic.  Right Ear: External ear normal.  Left Ear: External ear normal.  Eyes: Conjunctivae and EOM are normal. Pupils are equal, round, and reactive to light.  Neck: Normal range of motion. Neck supple.  Cardiovascular: Normal rate and regular rhythm.  Pulmonary/Chest: Effort normal and breath sounds normal.  Abdominal: Soft.  Neurological: He is alert and oriented to person, place, and time.  Neurologic exam:  Speech clear, pupils equal round reactive to light, extraocular movements intact. Unilateral nystagmus favoring the left. Normal peripheral visual fields Cranial nerves III through XII normal including no facial droop Follows commands, moves all extremities x4, normal strength to bilateral upper and lower extremities at all major muscle groups including grip Sensation normal to light touch and pinprick Coordination intact, no limb ataxia, finger-nose-finger normal Rapid alternating movements normal No pronator drift Gait not tested   Skin: No rash noted.  Psychiatric: He has a normal mood and affect.  Nursing note and vitals reviewed.    ED Treatments / Results  Labs (all labs ordered are listed, but only abnormal results are displayed) Labs Reviewed  BASIC METABOLIC PANEL - Abnormal; Notable for the following components:      Result Value   Glucose, Bld 109 (*)    GFR calc non Af Amer 59 (*)    All other components within normal limits  URINALYSIS, ROUTINE W REFLEX MICROSCOPIC - Abnormal; Notable for the following components:   Color, Urine STRAW (*)    All other components within normal limits  CBC WITH DIFFERENTIAL/PLATELET    EKG  EKG Interpretation None     ED ECG REPORT   Date: 07/26/2017  Rate: 78  Rhythm: normal sinus rhythm  QRS Axis: normal  Intervals: normal  ST/T Wave abnormalities: abnormal R-wve progression, early transition  Conduction Disutrbances:none  Narrative  Interpretation:   Old EKG Reviewed: unchanged  I have personally reviewed the EKG tracing and agree with the computerized printout as noted.   Radiology No results found.  Procedures Procedures (including critical care time)  Medications Ordered in ED Medications  meclizine (ANTIVERT) tablet 25 mg (25 mg Oral Given 07/26/17 0814)  diazepam (VALIUM) tablet 5 mg (5 mg Oral Given 07/26/17 6010)     Initial Impression / Assessment and Plan / ED Course  I have reviewed the triage vital signs and the nursing notes.  Pertinent labs & imaging results that were available during my care of the patient were reviewed by me and considered in my medical decision making (see chart for details).     BP (!) 146/79   Pulse 75   Temp 98.3 F (36.8 C) (Oral)  Resp (!) 21   Ht 6' (1.829 m)   Wt 95.3 kg (210 lb)   SpO2 98%   BMI 28.48 kg/m    Final Clinical Impressions(s) / ED Diagnoses   Final diagnoses:  BPPV (benign paroxysmal positional vertigo), left    ED Discharge Orders    None     7:51 AM Patient here with intermittent bouts of dizziness worse with positional changes, suggestive of benign proximal positional vertigo.  No tenderness.  Ear exam unremarkable.  Symptoms brought on with Dix-Hallpike maneuver.  Normal HINTS exam, doubt central cause.  Work up initiated, will treat sxs with meclizine and valium.  Care discussed with Dr. Johnney Killian.  9:41 AM Labs are reassuring.  EKG without concerning arrhythmia. Normal orthostatic vital sign.  Pt felt better with Valium and Meclizine.  Able to ambulate with less dizziness.  Suspect BPPV vs vestibular neuritis.  Doubt stroke.  Plan to d/c with sxs treatment.  Recommend Epley Maneuver and outpt f/u with PCP.  Return precaution given.     Domenic Moras, PA-C 07/26/17 1034    Charlesetta Shanks, MD 07/26/17 1226

## 2017-07-26 NOTE — ED Provider Notes (Signed)
Medical screening examination/treatment/procedure(s) were conducted as a shared visit with non-physician practitioner(s) and myself.  I personally evaluated the patient during the encounter.   EKG Interpretation  Date/Time:  Wednesday July 26 2017 07:20:08 EST Ventricular Rate:  78 PR Interval:    QRS Duration: 87 QT Interval:  376 QTC Calculation: 429 R Axis:   3 Text Interpretation:  Sinus rhythm Low voltage, precordial leads Abnormal R-wave progression, early transition normal. no change  Confirmed by Charlesetta Shanks [phone]) on 07/26/2017 10:31:08 AM     Days ago patient bent over and got sudden onset of spinning dizziness.  Since then he has had episodes of dizziness predominantly precipitated by bending over and changes in position of his head.  Associated weakness numbness or tingling. She is alert and appropriate mental status is clear.  All movements coordinated purposeful symmetric.  Heart regular Symptoms have responded well to meclizine and Valium.  I agree with plan of management.   Charlesetta Shanks, MD 07/26/17 1032

## 2017-08-13 ENCOUNTER — Other Ambulatory Visit: Payer: Self-pay | Admitting: Family Medicine

## 2017-08-13 DIAGNOSIS — E790 Hyperuricemia without signs of inflammatory arthritis and tophaceous disease: Secondary | ICD-10-CM

## 2017-08-13 DIAGNOSIS — Z125 Encounter for screening for malignant neoplasm of prostate: Secondary | ICD-10-CM

## 2017-08-13 DIAGNOSIS — E785 Hyperlipidemia, unspecified: Secondary | ICD-10-CM

## 2017-08-16 ENCOUNTER — Other Ambulatory Visit (INDEPENDENT_AMBULATORY_CARE_PROVIDER_SITE_OTHER): Payer: PPO

## 2017-08-16 DIAGNOSIS — Z125 Encounter for screening for malignant neoplasm of prostate: Secondary | ICD-10-CM | POA: Diagnosis not present

## 2017-08-16 DIAGNOSIS — E785 Hyperlipidemia, unspecified: Secondary | ICD-10-CM | POA: Diagnosis not present

## 2017-08-16 DIAGNOSIS — E790 Hyperuricemia without signs of inflammatory arthritis and tophaceous disease: Secondary | ICD-10-CM

## 2017-08-16 LAB — LIPID PANEL
CHOL/HDL RATIO: 5
Cholesterol: 152 mg/dL (ref 0–200)
HDL: 33.1 mg/dL — AB (ref >39.00)
LDL Cholesterol: 84 mg/dL (ref 0–99)
NONHDL: 119.11
Triglycerides: 178 mg/dL — ABNORMAL HIGH (ref 0.0–149.0)
VLDL: 35.6 mg/dL (ref 0.0–40.0)

## 2017-08-16 LAB — PSA, MEDICARE

## 2017-08-16 LAB — COMPREHENSIVE METABOLIC PANEL
ALK PHOS: 73 U/L (ref 39–117)
ALT: 30 U/L (ref 0–53)
AST: 23 U/L (ref 0–37)
Albumin: 4.5 g/dL (ref 3.5–5.2)
BILIRUBIN TOTAL: 0.5 mg/dL (ref 0.2–1.2)
BUN: 16 mg/dL (ref 6–23)
CO2: 30 meq/L (ref 19–32)
CREATININE: 1.18 mg/dL (ref 0.40–1.50)
Calcium: 9.8 mg/dL (ref 8.4–10.5)
Chloride: 103 mEq/L (ref 96–112)
GFR: 65.62 mL/min (ref >60.00)
GLUCOSE: 112 mg/dL — AB (ref 70–99)
Potassium: 4.9 mEq/L (ref 3.5–5.1)
SODIUM: 139 meq/L (ref 135–145)
TOTAL PROTEIN: 7.1 g/dL (ref 6.0–8.3)

## 2017-08-16 LAB — URIC ACID: URIC ACID, SERUM: 9.1 mg/dL — AB (ref 4.0–7.8)

## 2017-08-22 ENCOUNTER — Ambulatory Visit (INDEPENDENT_AMBULATORY_CARE_PROVIDER_SITE_OTHER): Payer: PPO | Admitting: Family Medicine

## 2017-08-22 ENCOUNTER — Encounter: Payer: Self-pay | Admitting: Family Medicine

## 2017-08-22 VITALS — BP 140/82 | HR 92 | Temp 98.5°F | Ht 72.0 in | Wt 211.2 lb

## 2017-08-22 DIAGNOSIS — Z23 Encounter for immunization: Secondary | ICD-10-CM

## 2017-08-22 DIAGNOSIS — E785 Hyperlipidemia, unspecified: Secondary | ICD-10-CM | POA: Diagnosis not present

## 2017-08-22 DIAGNOSIS — Z0000 Encounter for general adult medical examination without abnormal findings: Secondary | ICD-10-CM | POA: Diagnosis not present

## 2017-08-22 DIAGNOSIS — I10 Essential (primary) hypertension: Secondary | ICD-10-CM

## 2017-08-22 DIAGNOSIS — F3342 Major depressive disorder, recurrent, in full remission: Secondary | ICD-10-CM

## 2017-08-22 DIAGNOSIS — Z7189 Other specified counseling: Secondary | ICD-10-CM

## 2017-08-22 DIAGNOSIS — E790 Hyperuricemia without signs of inflammatory arthritis and tophaceous disease: Secondary | ICD-10-CM

## 2017-08-22 DIAGNOSIS — M25559 Pain in unspecified hip: Secondary | ICD-10-CM

## 2017-08-22 MED ORDER — VENLAFAXINE HCL ER 75 MG PO CP24: 150.0000 mg | ORAL_CAPSULE | Freq: Every day | ORAL | 3 refills | Status: DC

## 2017-08-22 MED ORDER — COLCHICINE 0.6 MG PO TABS: 0.6000 mg | ORAL_TABLET | Freq: Every day | ORAL | 0 refills | Status: DC | PRN

## 2017-08-22 MED ORDER — ROSUVASTATIN CALCIUM 10 MG PO TABS: ORAL_TABLET | ORAL | 3 refills | Status: DC

## 2017-08-22 MED ORDER — SILDENAFIL CITRATE 20 MG PO TABS: 60.0000 mg | ORAL_TABLET | Freq: Every day | ORAL | 99 refills | Status: DC | PRN

## 2017-08-22 MED ORDER — LISINOPRIL 30 MG PO TABS: ORAL_TABLET | ORAL | 3 refills | Status: DC

## 2017-08-22 MED ORDER — TAMSULOSIN HCL 0.4 MG PO CAPS: 0.4000 mg | ORAL_CAPSULE | Freq: Every day | ORAL | 3 refills | Status: DC

## 2017-08-22 NOTE — Progress Notes (Signed)
I have personally reviewed the Medicare Annual Wellness questionnaire and have noted 1. The patient's medical and social history 2. Their use of alcohol, tobacco or illicit drugs 3. Their current medications and supplements 4. The patient's functional ability including ADL's, fall risks, home safety risks and hearing or visual             impairment. 5. Diet and physical activities 6. Evidence for depression or mood disorders  The patients weight, height, BMI have been recorded in the chart and visual acuity is per eye clinic.  I have made referrals, counseling and provided education to the patient based review of the above and I have provided the pt with a written personalized care plan for preventive services.  Provider list updated- see scanned forms.  Routine anticipatory guidance given to patient.  See health maintenance. The possibility exists that previously documented standard health maintenance information may have been brought forward from a previous encounter into this note.  If needed, that same information has been updated to reflect the current situation based on today's encounter.    Flu today Shingles d/w pt PNA 2018 Tetanus 2010 Colonoscopy 2010 PSA 0, d/w pt.  Has seen urology.  Advance directive- wife designated if patient were incapacitated.   Cognitive function addressed- see scanned forms- and if abnormal then additional documentation follows.  No AAA seen on u/s prev.    He had ER f/u prev re: BPV.  He isn't using meds since his sx are better.  He is careful about leaning over with head ROM.  No sx currently.    Elevated Cholesterol: Using medications without problems:yes Muscle aches: not from statin.  He has hip pain from prev running hx, likely not with pain from statin.   Diet compliance: yes Exercise: encouraged more as tolerated due to hip pain, d/w pt.   He is putting up with hip pain and will update me as needed.  D/w pt.  He declined imaging at this  point.   Hypertension:               Using medication without problems or lightheadedness: yes Chest pain with exertion:no Edema:no Short of breath:no  D/w pt about his mod.  His mood is stable.  No SI/HI.  No ADE on med.  Sleeping okay.  He wanted to continue as is.    Uric acid up but no recent gout flares except for earlier this year.      PMH and SH reviewed  Meds, vitals, and allergies reviewed.   ROS: Per HPI.  Unless specifically indicated otherwise in HPI, the patient denies:  General: fever. Eyes: acute vision changes ENT: sore throat Cardiovascular: chest pain Respiratory: SOB GI: vomiting GU: dysuria Musculoskeletal: acute back pain Derm: acute rash Neuro: acute motor dysfunction Psych: worsening mood Endocrine: polydipsia Heme: bleeding Allergy: hayfever  GEN: nad, alert and oriented HEENT: mucous membranes moist NECK: supple w/o LA CV: rrr. PULM: ctab, no inc wob ABD: soft, +bs EXT: no edema SKIN: no acute rash

## 2017-08-22 NOTE — Patient Instructions (Addendum)
Check with your insurance to see if they will cover the shingrix shot. If you have to take colchicine, then skip the crestor for a few days.  Take care.  Glad to see you.  Update me as needed.

## 2017-08-24 DIAGNOSIS — M25559 Pain in unspecified hip: Secondary | ICD-10-CM | POA: Insufficient documentation

## 2017-08-24 NOTE — Assessment & Plan Note (Signed)
Continue statin, not having aches from the med but likely from other sources.  He agrees.  Update me as needed.

## 2017-08-24 NOTE — Assessment & Plan Note (Signed)
Controlled, continue as is.  Labs d/w pt.  He agrees.  No ADE on med.  D/w pt about diet and exercise.

## 2017-08-24 NOTE — Assessment & Plan Note (Signed)
His mood is stable.  No SI/HI.  No ADE on med.  Sleeping okay.  He wanted to continue as is.  Agreed.

## 2017-08-24 NOTE — Assessment & Plan Note (Signed)
Rare gout sx, using colchicine prn.  Update me as needed.

## 2017-08-24 NOTE — Assessment & Plan Note (Signed)
Flu today Shingles d/w pt PNA 2018 Tetanus 2010 Colonoscopy 2010 PSA 0, d/w pt.  Has seen urology.  Advance directive- wife designated if patient were incapacitated.   Cognitive function addressed- see scanned forms- and if abnormal then additional documentation follows.  No AAA seen on u/s prev.

## 2017-08-24 NOTE — Assessment & Plan Note (Signed)
Advance directive- wife designated if patient were incapacitated.  

## 2017-08-24 NOTE — Assessment & Plan Note (Signed)
He is putting up with hip pain and will update me as needed.  D/w pt.  He declined imaging at this point.

## 2017-09-20 ENCOUNTER — Other Ambulatory Visit: Payer: Self-pay | Admitting: Family Medicine

## 2017-10-25 ENCOUNTER — Encounter (HOSPITAL_COMMUNITY): Payer: Self-pay | Admitting: Emergency Medicine

## 2017-10-25 ENCOUNTER — Ambulatory Visit (HOSPITAL_COMMUNITY)
Admission: EM | Admit: 2017-10-25 | Discharge: 2017-10-25 | Disposition: A | Discharge status: Home / Self Care | Payer: PPO | Attending: Family Medicine | Admitting: Family Medicine

## 2017-10-25 ENCOUNTER — Other Ambulatory Visit: Payer: Self-pay

## 2017-10-25 DIAGNOSIS — R69 Illness, unspecified: Secondary | ICD-10-CM

## 2017-10-25 DIAGNOSIS — J111 Influenza due to unidentified influenza virus with other respiratory manifestations: Secondary | ICD-10-CM

## 2017-10-25 MED ORDER — HYDROCODONE-HOMATROPINE 5-1.5 MG/5ML PO SYRP: 5.0000 mL | ORAL_SOLUTION | Freq: Four times a day (QID) | ORAL | 0 refills | Status: DC | PRN

## 2017-10-25 NOTE — Discharge Instructions (Signed)

## 2017-10-25 NOTE — ED Triage Notes (Signed)
Pt c/o fever, headaches, took tylenol a few hours ago with temp of 102. Temp is now 98.5.

## 2017-10-25 NOTE — ED Provider Notes (Signed)
[facility]   [id number] 10/25/17 Arrival Time: 6967  ASSESSMENT & PLAN:  1. Influenza-like illness     Meds ordered this encounter  Medications  . HYDROcodone-homatropine (HYCODAN) 5-1.5 MG/5ML syrup    Sig: Take 5 mLs by mouth every 6 (six) hours as needed for cough.    Dispense:  90 mL    Refill:  0   Cough medication sedation precautions. Discussed typical duration of symptoms. OTC symptom care as needed. Ensure adequate fluid intake and rest. May f/u with PCP or here as needed.  Reviewed expectations re: course of current medical issues. Questions answered. Outlined signs and symptoms indicating need for more acute intervention. Patient verbalized understanding. After Visit Summary given.   SUBJECTIVE: History from: patient.  Ryan Blevins is a 67 y.o. male who presents with complaint of nasal congestion, post-nasal drainage, and a persistent dry cough. Onset abrupt, approximately 1 day ago. Overall fatigued with body aches. SOB: none. Wheezing: none. Fever: yes, subjective. Mild HA. Overall normal PO intake without n/v. Sick contacts: no. OTC treatment: Tylenol with mild help.  Received flu shot this year: yes.  Social History   Tobacco Use  Smoking Status Never Smoker  Smokeless Tobacco Never Used    ROS: As per HPI.   OBJECTIVE:  Vitals:   10/25/17 1823  BP: 119/83  Pulse: 98  Resp: 18  Temp: 98.5 F (36.9 C)  SpO2: 98%    General appearance: alert; appears fatigued HEENT: nasal congestion; clear runny nose; throat irritation secondary to post-nasal drainage Neck: supple without LAD Lungs: unlabored respirations, symmetrical air entry; cough: moderate; no respiratory distress Skin: warm and dry Psychological: alert and cooperative; normal mood and affect  Allergies  Allergen Reactions  . Atorvastatin     REACTION: but tolerant of crestor- this is the only statin he tolerates  . Bupropion Hcl     REACTION: mood changes  .  Codeine     Past Medical History:  Diagnosis Date  . Depression   . Hemorrhoid   . Hyperlipidemia   . Hypertension   . Hyperuricemia   . Low testosterone 2011  . Prostate cancer ([place]) 2011   per Dr. Risa Grill.  Seed implant per Dr. Valere Dross  . Skin cancer   . Syncope    Family History  Problem Relation Age of Onset  . Heart disease Mother        Multiple heart surgeries, valve repair  . Osteoporosis Mother        Vertebral fracture  . Stroke Mother   . Hypertension Father   . Hyperlipidemia Father   . Diabetes Father   . Parkinson's disease Father   . Colon cancer Neg Hx    Social History   Socioeconomic History  . Marital status: Married    Spouse name: Not on file  . Number of children: Not on file  . Years of education: Not on file  . Highest education level: Not on file  Social Needs  . Financial resource strain: Not on file  . Food insecurity - worry: Not on file  . Food insecurity - inability: Not on file  . Transportation needs - medical: Not on file  . Transportation needs - non-medical: Not on file  Occupational History  . 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 Smoking status: Never Smoker  . Smokeless tobacco: Never Used  Substance and Sexual Activity  . Alcohol use: Yes  Comment: Occasional  . Drug use: No  . Sexual activity: Not on file  Other Topics Concern  . Not on file  Social History Narrative   From [place].  [employer] grad, doctorate in Counseling, [place]. Worked in [employer].   [place]   Works in [employer] 2012           Vanessa Kick, MD 10/25/17 [phone]

## 2017-11-12 ENCOUNTER — Other Ambulatory Visit: Payer: Self-pay | Admitting: Family Medicine

## 2017-12-25 DIAGNOSIS — L570 Actinic keratosis: Secondary | ICD-10-CM | POA: Diagnosis not present

## 2017-12-25 DIAGNOSIS — L821 Other seborrheic keratosis: Secondary | ICD-10-CM | POA: Diagnosis not present

## 2017-12-25 DIAGNOSIS — L812 Freckles: Secondary | ICD-10-CM | POA: Diagnosis not present

## 2017-12-25 DIAGNOSIS — L578 Other skin changes due to chronic exposure to nonionizing radiation: Secondary | ICD-10-CM | POA: Diagnosis not present

## 2018-01-03 ENCOUNTER — Telehealth: Payer: Self-pay | Admitting: Family Medicine

## 2018-01-03 NOTE — Telephone Encounter (Signed)
Copied from [place] [id number]. Topic: Inquiry >> Jan 03, 2018 12:50 PM Margot Ables wrote: Pt states that he has a sinus infection. Head hurts with coughing and moving, R eye hurts, R gums hurt, and R ear hurts. No fever. Pt is requesting a medication be sent in for him without coming in. Please call to advise.  [facility] - [address], [address] AT [address] [place] [address] Phone: [phone] Fax: [phone]

## 2018-01-03 NOTE — Telephone Encounter (Signed)
Patient notified as instructed by telephone and verbalized understanding. Appointment scheduled with Dr. Glori Bickers tomorrow 01/04/18.

## 2018-01-03 NOTE — Telephone Encounter (Signed)
Needs OV if at all possible.  That would be preferred for patient safety.  Thanks.

## 2018-01-04 ENCOUNTER — Ambulatory Visit: Payer: PPO | Admitting: Family Medicine

## 2018-01-04 ENCOUNTER — Encounter: Payer: Self-pay | Admitting: Family Medicine

## 2018-01-04 VITALS — BP 154/84 | HR 96 | Temp 98.2°F | Ht 72.0 in | Wt 212.0 lb

## 2018-01-04 DIAGNOSIS — J0100 Acute maxillary sinusitis, unspecified: Secondary | ICD-10-CM | POA: Diagnosis not present

## 2018-01-04 DIAGNOSIS — I10 Essential (primary) hypertension: Secondary | ICD-10-CM

## 2018-01-04 DIAGNOSIS — J019 Acute sinusitis, unspecified: Secondary | ICD-10-CM | POA: Insufficient documentation

## 2018-01-04 MED ORDER — AMOXICILLIN-POT CLAVULANATE 875-125 MG PO TABS: 1.0000 | ORAL_TABLET | Freq: Two times a day (BID) | ORAL | 0 refills | Status: DC

## 2018-01-04 NOTE — Assessment & Plan Note (Signed)
S/p 2 wk of uri /and allergy congestion  Cover with augmentin  Fluids/rest  Expectorant prn  Saline sinus flush prn   Update if not starting to improve in a week or if worsening    Meds ordered this encounter  Medications  . amoxicillin-clavulanate (AUGMENTIN) 875-125 MG tablet    Sig: Take 1 tablet by mouth 2 (two) times daily.    Dispense:  14 tablet    Refill:  0

## 2018-01-04 NOTE — Progress Notes (Signed)
Subjective:    Patient ID: Ryan Blevins, male    DOB: 04-17-1951, 67 y.o.   MRN: [id number]  HPI Here for sinus /nasal congestion   Had a lot of nasal congestion a few weeks ago -suspected a cold  Pollen is also bothering him  Bad pressure and pain in his R face (eye/cheek)  If he bends over it hurts more and makes him dizzy   No fever  Nasal d/c is mostly clear   Cough -occ /was worse when he had a cold  Not productive No wheezing     bp is high today  BP Readings from Last 3 Encounters:  01/04/18 (!) 154/84  10/25/17 119/83  08/22/17 140/82    Review of Systems  Constitutional: Positive for appetite change. Negative for fatigue and fever.  HENT: Positive for congestion, ear pain, postnasal drip, rhinorrhea, sinus pressure and sore throat. Negative for nosebleeds.   Eyes: Negative for pain, redness and itching.  Respiratory: Positive for cough. Negative for shortness of breath and wheezing.   Cardiovascular: Negative for chest pain.  Gastrointestinal: Negative for abdominal pain, diarrhea, nausea and vomiting.  Endocrine: Negative for polyuria.  Genitourinary: Negative for dysuria, frequency and urgency.  Musculoskeletal: Negative for arthralgias and myalgias.  Allergic/Immunologic: Negative for immunocompromised state.  Neurological: Positive for headaches. Negative for dizziness, tremors, syncope, weakness and numbness.  Hematological: Negative for adenopathy. Does not bruise/bleed easily.  Psychiatric/Behavioral: Negative for dysphoric mood. The patient is not nervous/anxious.        Objective:   Physical Exam  Constitutional: He appears well-developed and well-nourished. No distress.  Well appearing   HENT:  Head: Normocephalic and atraumatic.  Right Ear: External ear normal.  Left Ear: External ear normal.  Mouth/Throat: Oropharynx is clear and moist. No oropharyngeal exudate.  Nares are injected and congested  Left frontal and maxillary sinus  tenderness  Post nasal drip  Dev septum to the R  TMs clear    Eyes: Pupils are equal, round, and reactive to light. Conjunctivae and EOM are normal. Right eye exhibits no discharge. Left eye exhibits no discharge.  Neck: Normal range of motion. Neck supple.  Cardiovascular: Normal rate and regular rhythm.  Pulmonary/Chest: Effort normal and breath sounds normal. No respiratory distress. He has no wheezes. He has no rales.  Good air exch   Lymphadenopathy:    He has no cervical adenopathy.  Neurological: He is alert. No cranial nerve deficit.  Skin: Skin is warm and dry. No rash noted.  Psychiatric: He has a normal mood and affect.          Assessment & Plan:   Problem List Items Addressed This Visit      Cardiovascular and Mediastinum   HYPERTENSION, BENIGN ESSENTIAL    bp is elevated today  ? Due to HA  Asked him to f/u for a re check when sinusitis is resolved         Respiratory   Acute sinusitis - Primary    S/p 2 wk of uri /and allergy congestion  Cover with augmentin  Fluids/rest  Expectorant prn  Saline sinus flush prn   Update if not starting to improve in a week or if worsening    Meds ordered this encounter  Medications  . amoxicillin-clavulanate (AUGMENTIN) 875-125 MG tablet    Sig: Take 1 tablet by mouth 2 (two) times daily.    Dispense:  14 tablet    Refill:  0  Relevant Medications   amoxicillin-clavulanate (AUGMENTIN) 875-125 MG tablet

## 2018-01-04 NOTE — Assessment & Plan Note (Signed)
bp is elevated today  ? Due to HA  Asked him to f/u for a re check when sinusitis is resolved

## 2018-01-04 NOTE — Patient Instructions (Signed)
I think you are developing a sinus infection   Drink lots of fluids mucinex helps loosen mucous  Saline sinus irrigation as needed   Warm compresses on sinuses as needed    Take the augmentin as directed   Update if not starting to improve in a week or if worsening

## 2018-01-06 ENCOUNTER — Telehealth: Payer: Self-pay | Admitting: Family Medicine

## 2018-01-06 NOTE — Telephone Encounter (Signed)
Have patient schedule f/u re: BP if not better when his acute illness is resolved.  Thanks.

## 2018-01-08 NOTE — Telephone Encounter (Signed)
Patient advised.

## 2018-01-17 ENCOUNTER — Telehealth: Payer: Self-pay

## 2018-01-17 MED ORDER — AMOXICILLIN-POT CLAVULANATE 875-125 MG PO TABS: 1.0000 | ORAL_TABLET | Freq: Two times a day (BID) | ORAL | 0 refills | Status: DC

## 2018-01-17 NOTE — Telephone Encounter (Signed)
Pt said within a day of finishing abx his sxs returned. He said he doesn't have any cough, congestion or fever. It's the exact same sxs as he had when he was here. It's all in his face/neck. He has facial pain, eye and teeth pain and pressure. He feels like his sinus are swollen but he can't "blow" anything out his nose. He feels like if he could blow his nose and some drainage would come out it would make him feel better but he can't

## 2018-01-17 NOTE — Telephone Encounter (Signed)
Please send in an additional 5 days of augmentin  If not already planned-f/u with pcp in 10-14 days for a re check of bp if he has not done so already  [facility] he feels better soon!

## 2018-01-17 NOTE — Telephone Encounter (Signed)
Copied from [place] [id number]. Topic: Inquiry >> Jan 17, 2018  8:16 AM Pricilla Handler wrote: Reason for CRM: Patient called requesting a new medication for his Sinus Infection. Patient sees Dr. Damita Dunnings, but had to see Dr. Glori Bickers on 01/04/2018 as Dr. Damita Dunnings was not available. Patient states that the medication given worked great, but after he ran out, the symptoms have returned. Patient wants either Dr. Damita Dunnings or Dr. Glori Bickers to call in a new prescription to his pharmacy. Please call patient this morning.      Thank You!!!

## 2018-01-17 NOTE — Telephone Encounter (Signed)
Thanks

## 2018-01-17 NOTE — Telephone Encounter (Signed)
Rx sent to pharmacy and pt notified and f/u appt with Dr. Damita Dunnings scheduled

## 2018-01-17 NOTE — Telephone Encounter (Signed)
How many days were his symptoms resolved?  What symptoms have returned ?  What color nasal d/c ?  Facial pain? Fever? Cough?  Thanks

## 2018-01-30 ENCOUNTER — Ambulatory Visit (INDEPENDENT_AMBULATORY_CARE_PROVIDER_SITE_OTHER): Payer: PPO | Admitting: Family Medicine

## 2018-01-30 ENCOUNTER — Encounter: Payer: Self-pay | Admitting: Family Medicine

## 2018-01-30 DIAGNOSIS — I10 Essential (primary) hypertension: Secondary | ICD-10-CM | POA: Diagnosis not present

## 2018-01-30 NOTE — Patient Instructions (Signed)
Your pressure was good today.  Take care.  I'll be thinking about you and let me know if I can be of service.   Update me as needed.

## 2018-01-30 NOTE — Progress Notes (Signed)
Hypertension:   Using medication without problems or lightheadedness: occ lightheaded but this is at baseline- he gets up slower at night now and that helps.  Chest pain with exertion:no Edema:no Short of breath: no  Prev illness resolved.  He has seasonal allergies now.  We talked about preemptive flonase use next spring.    His mother is on hospice and expected to die in the next 2 weeks.  Condolences offered.  She is at her sister's home and they are trying to decide about moving her to a hospice home.    His wife has glaucoma and needs corrective surgery on each eye separately.    Meds, vitals, and allergies reviewed.   ROS: Per HPI unless specifically indicated in ROS section   GEN: nad, alert and oriented HEENT: mucous membranes moist NECK: supple w/o LA CV: rrr.  PULM: ctab, no inc wob ABD: soft, +bs EXT: no edema SKIN: well perfused.

## 2018-01-31 NOTE — Assessment & Plan Note (Addendum)
Reasonable control.  He is putting up with noted stressors.  See above.  His mother is dying on hospice and his wife is going to have to have significant bilateral eye surgery.  In spite of this his blood pressure was reasonable today.  Continue as is.  Update me as needed.  He agrees.

## 2018-02-07 ENCOUNTER — Other Ambulatory Visit: Payer: Self-pay | Admitting: Family Medicine

## 2018-02-07 DIAGNOSIS — L820 Inflamed seborrheic keratosis: Secondary | ICD-10-CM | POA: Diagnosis not present

## 2018-02-07 DIAGNOSIS — L821 Other seborrheic keratosis: Secondary | ICD-10-CM | POA: Diagnosis not present

## 2018-02-07 DIAGNOSIS — L570 Actinic keratosis: Secondary | ICD-10-CM | POA: Diagnosis not present

## 2018-02-07 DIAGNOSIS — L99 Other disorders of skin and subcutaneous tissue in diseases classified elsewhere: Secondary | ICD-10-CM | POA: Diagnosis not present

## 2018-02-08 ENCOUNTER — Other Ambulatory Visit: Payer: Self-pay | Admitting: Family Medicine

## 2018-02-08 NOTE — Telephone Encounter (Signed)
Sent. Thanks.   

## 2018-02-08 NOTE — Telephone Encounter (Signed)
Electronic refill request. Sildenafil Last office visit:   01/30/18 Last Filled:    50 tablet prn 08/22/2017  Please advise.

## 2018-04-18 DIAGNOSIS — L820 Inflamed seborrheic keratosis: Secondary | ICD-10-CM | POA: Diagnosis not present

## 2018-04-18 DIAGNOSIS — D485 Neoplasm of uncertain behavior of skin: Secondary | ICD-10-CM | POA: Diagnosis not present

## 2018-04-18 DIAGNOSIS — L578 Other skin changes due to chronic exposure to nonionizing radiation: Secondary | ICD-10-CM | POA: Diagnosis not present

## 2018-04-18 DIAGNOSIS — L570 Actinic keratosis: Secondary | ICD-10-CM

## 2018-04-18 DIAGNOSIS — R040 Epistaxis: Secondary | ICD-10-CM | POA: Diagnosis not present

## 2018-04-18 HISTORY — DX: Actinic keratosis: L57.0

## 2018-04-25 ENCOUNTER — Encounter: Payer: Self-pay | Admitting: Family Medicine

## 2018-04-25 ENCOUNTER — Ambulatory Visit (INDEPENDENT_AMBULATORY_CARE_PROVIDER_SITE_OTHER): Payer: PPO | Admitting: Family Medicine

## 2018-04-25 VITALS — BP 152/82 | HR 94 | Temp 98.4°F | Ht 72.0 in | Wt 216.0 lb

## 2018-04-25 DIAGNOSIS — J309 Allergic rhinitis, unspecified: Secondary | ICD-10-CM

## 2018-04-25 DIAGNOSIS — R591 Generalized enlarged lymph nodes: Secondary | ICD-10-CM

## 2018-04-25 MED ORDER — FLUTICASONE PROPIONATE 50 MCG/ACT NA SUSP: 2.0000 | Freq: Every day | NASAL | 6 refills | Status: DC

## 2018-04-25 NOTE — Progress Notes (Signed)
Subjective:    Patient ID: Ryan Blevins, male    DOB: Nov 02, 1950, 67 y.o.   MRN: [id number]  HPI This is a 67 yo male who walks into the office today with concern for swelling of his neck. Awoke this morning and noticed neck swelling on left side. Teeth were getting sore. Ate breakfast and noticed more swelling which has since gone down. No lip swelling. Some runny nose and nasal congestion off and on, worse over last couple of days. Feels like his sinuses are acting up. More issues with allergies in the last year. No throat or lip swelling, no cough. Has not taken anything for symptoms.  Takes fluticasone nasal spray occasionally.   Past Medical History:  Diagnosis Date  . Depression   . Hemorrhoid   . Hyperlipidemia   . Hypertension   . Hyperuricemia   . Low testosterone 2011  . Prostate cancer ([place]) 2011   per Dr. Risa Grill.  Seed implant per Dr. Valere Dross  . Skin cancer   . Syncope    Past Surgical History:  Procedure Laterality Date  . COLONOSCOPY  2010   Dr Bary Castilla   Family History  Problem Relation Age of Onset  . Heart disease Mother        Multiple heart surgeries, valve repair  . Osteoporosis Mother        Vertebral fracture  . Stroke Mother   . Hypertension Father   . Hyperlipidemia Father   . Diabetes Father   . Parkinson's disease Father   . Colon cancer Neg Hx    Social History   Tobacco Use  . Smoking status: Never Smoker  . Smokeless tobacco: Never Used  Substance Use Topics  . Alcohol use: Yes    Comment: Occasional  . Drug use: No      Review of Systems Per HPI    Objective:   Physical Exam  Constitutional: He is oriented to person, place, and time. He appears well-developed and well-nourished. No distress.  HENT:  Head: Normocephalic and atraumatic.  Right Ear: Tympanic membrane, external ear and ear canal normal.  Left Ear: Tympanic membrane, external ear and ear canal normal.  Nose: Nose normal. Right sinus exhibits no maxillary  sinus tenderness and no frontal sinus tenderness. Left sinus exhibits no maxillary sinus tenderness and no frontal sinus tenderness.  Mouth/Throat: Uvula is midline and oropharynx is clear and moist.  Eyes: Conjunctivae are normal.  Neck: Normal range of motion. Neck supple.  Palpable submandibular and deep cervical nodes. Mildly tender.   Cardiovascular: Normal rate, regular rhythm and normal heart sounds.  Pulmonary/Chest: Effort normal and breath sounds normal.  Neurological: He is alert and oriented to person, place, and time.  Skin: Skin is warm and dry. He is not diaphoretic.  Psychiatric: He has a normal mood and affect. His behavior is normal. Judgment and thought content normal.  Vitals reviewed.     BP (!) 152/82 (BP Location: Right Arm, Patient Position: Sitting, Cuff Size: Normal)   Pulse 94   Temp 98.4 F (36.9 C) (Oral)   Ht 6' (1.829 m)   Wt 216 lb (98 kg)   SpO2 98%   BMI 29.29 kg/m      Assessment & Plan:  1. Allergic rhinitis, unspecified seasonality, unspecified trigger - fluticasone (FLONASE) 50 MCG/ACT nasal spray; Place 2 sprays into both nostrils daily.  Dispense: 16 g; Refill: 6  2. Lymphadenopathy of head and neck - likely reactive, RTC precautions reviewed, if persistent  beyond several weeks will check labs   Clarene Reamer, FNP-BC  [facility], [place]  04/25/2018 5:06 PM

## 2018-04-25 NOTE — Patient Instructions (Signed)
Good to see you today  Please use fluticasone nasal spray nightly for at least 3 weeks  If lymph node swelling continues please let us know- we will check blood work

## 2018-08-03 ENCOUNTER — Other Ambulatory Visit: Payer: Self-pay | Admitting: Family Medicine

## 2018-08-14 ENCOUNTER — Ambulatory Visit: Payer: PPO | Admitting: Family Medicine

## 2018-08-19 ENCOUNTER — Other Ambulatory Visit: Payer: Self-pay | Admitting: Family Medicine

## 2018-08-19 DIAGNOSIS — E790 Hyperuricemia without signs of inflammatory arthritis and tophaceous disease: Secondary | ICD-10-CM

## 2018-08-19 DIAGNOSIS — E785 Hyperlipidemia, unspecified: Secondary | ICD-10-CM

## 2018-08-19 DIAGNOSIS — Z125 Encounter for screening for malignant neoplasm of prostate: Secondary | ICD-10-CM

## 2018-08-23 ENCOUNTER — Other Ambulatory Visit (INDEPENDENT_AMBULATORY_CARE_PROVIDER_SITE_OTHER): Payer: PPO

## 2018-08-23 DIAGNOSIS — E790 Hyperuricemia without signs of inflammatory arthritis and tophaceous disease: Secondary | ICD-10-CM | POA: Diagnosis not present

## 2018-08-23 DIAGNOSIS — Z125 Encounter for screening for malignant neoplasm of prostate: Secondary | ICD-10-CM

## 2018-08-23 DIAGNOSIS — E785 Hyperlipidemia, unspecified: Secondary | ICD-10-CM | POA: Diagnosis not present

## 2018-08-23 LAB — COMPREHENSIVE METABOLIC PANEL
ALK PHOS: 83 U/L (ref 39–117)
ALT: 45 U/L (ref 0–53)
AST: 32 U/L (ref 0–37)
Albumin: 4.5 g/dL (ref 3.5–5.2)
BILIRUBIN TOTAL: 0.5 mg/dL (ref 0.2–1.2)
BUN: 14 mg/dL (ref 6–23)
CO2: 30 meq/L (ref 19–32)
Calcium: 9.9 mg/dL (ref 8.4–10.5)
Chloride: 102 mEq/L (ref 96–112)
Creatinine, Ser: 1.23 mg/dL (ref 0.40–1.50)
GFR: 62.36 mL/min (ref >60.00)
GLUCOSE: 111 mg/dL — AB (ref 70–99)
Potassium: 4.4 mEq/L (ref 3.5–5.1)
Sodium: 140 mEq/L (ref 135–145)
TOTAL PROTEIN: 7.3 g/dL (ref 6.0–8.3)

## 2018-08-23 LAB — LIPID PANEL
CHOL/HDL RATIO: 4
CHOLESTEROL: 144 mg/dL (ref 0–200)
HDL: 36.1 mg/dL — ABNORMAL LOW (ref >39.00)
NONHDL: 107.72
TRIGLYCERIDES: 239 mg/dL — AB (ref 0.0–149.0)
VLDL: 47.8 mg/dL — ABNORMAL HIGH (ref 0.0–40.0)

## 2018-08-23 LAB — PSA, MEDICARE: PSA: 0.01 ng/ml — ABNORMAL LOW (ref 0.10–4.00)

## 2018-08-23 LAB — URIC ACID: URIC ACID, SERUM: 8.1 mg/dL — AB (ref 4.0–7.8)

## 2018-08-23 LAB — LDL CHOLESTEROL, DIRECT: Direct LDL: 76 mg/dL

## 2018-08-27 DIAGNOSIS — L812 Freckles: Secondary | ICD-10-CM | POA: Diagnosis not present

## 2018-08-27 DIAGNOSIS — L821 Other seborrheic keratosis: Secondary | ICD-10-CM | POA: Diagnosis not present

## 2018-08-27 DIAGNOSIS — L578 Other skin changes due to chronic exposure to nonionizing radiation: Secondary | ICD-10-CM | POA: Diagnosis not present

## 2018-08-27 DIAGNOSIS — L570 Actinic keratosis: Secondary | ICD-10-CM | POA: Diagnosis not present

## 2018-08-30 ENCOUNTER — Encounter: Payer: Self-pay | Admitting: Family Medicine

## 2018-08-30 ENCOUNTER — Ambulatory Visit (INDEPENDENT_AMBULATORY_CARE_PROVIDER_SITE_OTHER): Payer: PPO | Admitting: Family Medicine

## 2018-08-30 VITALS — BP 136/86 | HR 65 | Temp 98.3°F | Ht 72.0 in | Wt 214.5 lb

## 2018-08-30 DIAGNOSIS — E785 Hyperlipidemia, unspecified: Secondary | ICD-10-CM

## 2018-08-30 DIAGNOSIS — I10 Essential (primary) hypertension: Secondary | ICD-10-CM

## 2018-08-30 DIAGNOSIS — Z7189 Other specified counseling: Secondary | ICD-10-CM

## 2018-08-30 DIAGNOSIS — C61 Malignant neoplasm of prostate: Secondary | ICD-10-CM

## 2018-08-30 DIAGNOSIS — Z23 Encounter for immunization: Secondary | ICD-10-CM | POA: Diagnosis not present

## 2018-08-30 DIAGNOSIS — R739 Hyperglycemia, unspecified: Secondary | ICD-10-CM

## 2018-08-30 DIAGNOSIS — Z0000 Encounter for general adult medical examination without abnormal findings: Secondary | ICD-10-CM | POA: Diagnosis not present

## 2018-08-30 DIAGNOSIS — Z1211 Encounter for screening for malignant neoplasm of colon: Secondary | ICD-10-CM

## 2018-08-30 DIAGNOSIS — F339 Major depressive disorder, recurrent, unspecified: Secondary | ICD-10-CM

## 2018-08-30 MED ORDER — VENLAFAXINE HCL ER 75 MG PO CP24: 150.0000 mg | ORAL_CAPSULE | Freq: Every day | ORAL | 3 refills | Status: DC

## 2018-08-30 MED ORDER — LISINOPRIL 30 MG PO TABS: ORAL_TABLET | ORAL | 3 refills | Status: DC

## 2018-08-30 MED ORDER — TAMSULOSIN HCL 0.4 MG PO CAPS: 0.4000 mg | ORAL_CAPSULE | Freq: Every day | ORAL | 3 refills | Status: DC

## 2018-08-30 MED ORDER — ROSUVASTATIN CALCIUM 10 MG PO TABS: ORAL_TABLET | ORAL | 3 refills | Status: DC

## 2018-08-30 NOTE — Progress Notes (Signed)
I have personally reviewed the Medicare Annual Wellness questionnaire and have noted 1. The patient's medical and social history 2. Their use of alcohol, tobacco or illicit drugs 3. Their current medications and supplements 4. The patient's functional ability including ADL's, fall risks, home safety risks and hearing or visual             impairment. 5. Diet and physical activities 6. Evidence for depression or mood disorders  The patients weight, height, BMI have been recorded in the chart and visual acuity is per eye clinic.  I have made referrals, counseling and provided education to the patient based review of the above and I have provided the pt with a written personalized care plan for preventive services.  Provider list updated- see scanned forms.  Routine anticipatory guidance given to patient.  See health maintenance. The possibility exists that previously documented standard health maintenance information may have been brought forward from a previous encounter into this note.  If needed, that same information has been updated to reflect the current situation based on today's encounter.    Flutoday Shinglesd/w pt [id number] Tetanus2010 Colonoscopy 2010, referral placed. PSA 0, d/w pt.  Advance directive- wife designated if patient were incapacitated. Cognitive function addressed- see scanned forms- and if abnormal then additional documentation follows. No AAA seen on u/s prev.  Prostate cancer hx noted. He'll call about routine f/u.  D/w pt.  See avs.  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  He had flushing on sildenafil and he can ask urology about options.  D/w pt.   Hypertension:    Using medication without problems or lightheadedness: yes Chest pain with exertion:no Edema:no Short of breath:no  Elevated Cholesterol: Using medications without problems: yes Muscle aches: minimal aches, tolerable.  Can tolerate crestor Diet compliance:encouraged.   Exercise:encouraged  He  has some occ back or R sided rib discomfort, episodically noted in the last year.  He can go for long periods w/o troubles.  No clear exertional sx.  He has some heartburn that improves with TUMS.    MDD.   Still on venlafaxine at baseline.  Mood is good.  His wife had to have eye surgery and that was stressful.  She still has some vision loss."  (I) You adjust" to the changes.  D/w pt.    PMH and SH reviewed  Meds, vitals, and allergies reviewed.   ROS: Per HPI.  Unless specifically indicated otherwise in HPI, the patient denies:  General: fever. Eyes: acute vision changes ENT: sore throat Cardiovascular: chest pain Respiratory: SOB GI: vomiting GU: dysuria Musculoskeletal: acute back pain Derm: acute rash Neuro: acute motor dysfunction Psych: worsening mood Endocrine: polydipsia Heme: bleeding Allergy: hayfever  GEN: nad, alert and oriented HEENT: mucous membranes moist NECK: supple w/o LA CV: rrr. PULM: ctab, no inc wob ABD: soft, +bs EXT: no edema SKIN: no acute rash

## 2018-08-30 NOTE — Patient Instructions (Addendum)
Call urology about a routine follow up.  [phone].    We make arrangements for referrals, extra imaging, and other appointments based on the urgency of the situation. Referrals are handled based on the clinical situation, not in the order that they are placed. If you do not see one of our referral coordinators on the way out of the clinic today, then you should expect a call in the next 1 to 2 weeks. We work diligently to process all referrals as quickly as possible.    Recheck sugar and lipids in about 6 months.  Take care.  Glad to see you.

## 2018-09-03 NOTE — Assessment & Plan Note (Signed)
Advance directive- wife designated if patient were incapacitated.  

## 2018-09-03 NOTE — Assessment & Plan Note (Signed)
Encourage diet and exercise.  Repeat labs with lipid and sugar in about 6 months.  No change in meds at this point.  He agrees.

## 2018-09-03 NOTE — Assessment & Plan Note (Addendum)
Flutoday Shinglesd/w pt [id number] Tetanus2010 Colonoscopy 2010, referral placed. PSA 0, d/w pt.  Advance directive- wife designated if patient were incapacitated. Cognitive function addressed- see scanned forms- and if abnormal then additional documentation follows. No AAA seen on u/s prev.

## 2018-09-03 NOTE — Assessment & Plan Note (Signed)
I do not want to induce hypotension.Encouraged diet and exercise.  Repeat labs with lipid and sugar in about 6 months.  No change in meds at this point.  He agrees.

## 2018-09-03 NOTE — Assessment & Plan Note (Signed)
He will call about follow-up with urology.  Discussed with patient.

## 2018-09-03 NOTE — Assessment & Plan Note (Signed)
Mood is good considering the baseline stressors he has had going on during the past year.  His wife had to have surgery and this was a significant change.  No suicidal or homicidal intent.  Still okay for outpatient follow-up.  Continue current medications.

## 2018-10-04 ENCOUNTER — Telehealth: Payer: Self-pay | Admitting: General Surgery

## 2018-10-04 NOTE — Telephone Encounter (Signed)
Spoke with the patient letting him know that dr byrnett would not be in the office on the 21st for his appointment, patient would need to get a hold of his primary care doctor and talk with him about be referred to [facility] or if his pcp had someone else in mind.

## 2018-10-09 ENCOUNTER — Ambulatory Visit: Payer: Self-pay | Admitting: General Surgery

## 2018-10-15 DIAGNOSIS — R21 Rash and other nonspecific skin eruption: Secondary | ICD-10-CM | POA: Diagnosis not present

## 2018-10-15 DIAGNOSIS — B09 Unspecified viral infection characterized by skin and mucous membrane lesions: Secondary | ICD-10-CM | POA: Diagnosis not present

## 2018-10-18 ENCOUNTER — Other Ambulatory Visit: Payer: Self-pay

## 2018-10-18 ENCOUNTER — Ambulatory Visit: Payer: PPO | Admitting: General Surgery

## 2018-10-18 ENCOUNTER — Encounter: Payer: Self-pay | Admitting: General Surgery

## 2018-10-18 VITALS — BP 154/87 | HR 95 | Temp 97.3°F | Resp 18 | Ht 72.0 in | Wt 217.2 lb

## 2018-10-18 DIAGNOSIS — Z1211 Encounter for screening for malignant neoplasm of colon: Secondary | ICD-10-CM

## 2018-10-18 MED ORDER — POLYETHYLENE GLYCOL 3350 17 GM/SCOOP PO POWD: ORAL | 0 refills | Status: DC

## 2018-10-18 NOTE — Patient Instructions (Signed)
The patient is aware to call back for any questions or new concerns.  Colonoscopy, Adult A colonoscopy is an exam to look at the entire large intestine. During the exam, a lubricated, flexible tube that has a camera on the end of it is inserted into the anus and then passed into the rectum, colon, and other parts of the large intestine. You may have a colonoscopy as a part of normal colorectal screening or if you have certain symptoms, such as:  Lack of red blood cells (anemia).  Diarrhea that does not go away.  Abdominal pain.  Blood in your stool (feces). A colonoscopy can help screen for and diagnose medical problems, including:  Tumors.  Polyps.  Inflammation.  Areas of bleeding. Tell a health care provider about:  Any allergies you have.  All medicines you are taking, including vitamins, herbs, eye drops, creams, and over-the-counter medicines.  Any problems you or family members have had with anesthetic medicines.  Any blood disorders you have.  Any surgeries you have had.  Any medical conditions you have.  Any problems you have had passing stool. What are the risks? Generally, this is a safe procedure. However, problems may occur, including:  Bleeding.  A tear in the intestine.  A reaction to medicines given during the exam.  Infection (rare). What happens before the procedure? Eating and drinking restrictions Follow instructions from your health care provider about eating and drinking, which may include:  A few days before the procedure - follow a low-fiber diet. Avoid nuts, seeds, dried fruit, raw fruits, and vegetables.  1-3 days before the procedure - follow a clear liquid diet. Drink only clear liquids, such as clear broth or bouillon, black coffee or tea, clear juice, clear soft drinks or sports drinks, gelatin dessert, and popsicles. Avoid any liquids that contain red or purple dye.  On the day of the procedure - do not eat or drink anything  starting 2 hours before the procedure, or within the time period that your health care provider recommends. Up to 2 hours before the procedure, you may continue to drink clear liquids, such as water or clear fruit juice. Bowel prep If you were prescribed an oral bowel prep to clean out your colon:  Take it as told by your health care provider. Starting the day before your procedure, you will need to drink a large amount of medicated liquid. The liquid will cause you to have multiple loose stools until your stool is almost clear or light green.  If your skin or anus gets irritated from diarrhea, you may use these to relieve the irritation: ? Medicated wipes, such as adult wet wipes with aloe and vitamin E. ? A skin-soothing product like petroleum jelly.  If you vomit while drinking the bowel prep, take a break for up to 60 minutes and then begin the bowel prep again. If vomiting continues and you cannot take the bowel prep without vomiting, call your health care provider.  To clean out your colon, you may also be given: ? Laxative medicines. ? Instructions about how to use an enema. General instructions  Ask your health care provider about: ? Changing or stopping your regular medicines or supplements. This is especially important if you are taking iron supplements, diabetes medicines, or blood thinners. ? Taking medicines such as aspirin and ibuprofen. These medicines can thin your blood. Do not take these medicines before the procedure if your health care provider tells you not to.  Plan to have  someone take you home from the hospital or clinic. What happens during the procedure?   An IV may be inserted into one of your veins.  You will be given medicine to help you relax (sedative).  To reduce your risk of infection: ? Your health care team will wash or sanitize their hands. ? Your anal area will be washed with soap.  You will be asked to lie on your side with your knees  bent.  Your health care provider will lubricate a long, thin, flexible tube. The tube will have a camera and a light on the end.  The tube will be inserted into your anus.  The tube will be gently eased through your rectum and colon.  Air will be delivered into your colon to keep it open. You may feel some pressure or cramping.  The camera will be used to take images during the procedure.  A small tissue sample may be removed to be examined under a microscope (biopsy).  If small polyps are found, your health care provider may remove them and have them checked for cancer cells.  When the exam is done, the tube will be removed. The procedure may vary among health care providers and hospitals. What happens after the procedure?  Your blood pressure, heart rate, breathing rate, and blood oxygen level will be monitored until the medicines you were given have worn off.  Do not drive for 24 hours after the exam.  You may have a small amount of blood in your stool.  You may pass gas and have mild abdominal cramping or bloating due to the air that was used to inflate your colon during the exam.  It is up to you to get the results of your procedure. Ask your health care provider, or the department performing the procedure, when your results will be ready. Summary  A colonoscopy is an exam to look at the entire large intestine.  During a colonoscopy, a lubricated, flexible tube with a camera on the end of it is inserted into the anus and then passed into the colon and other parts of the large intestine.  Follow instructions from your health care provider about eating and drinking before the procedure.  If you were prescribed an oral bowel prep to clean out your colon, take it as told by your health care provider.  After your procedure, your blood pressure, heart rate, breathing rate, and blood oxygen level will be monitored until the medicines you were given have worn off. This  information is not intended to replace advice given to you by your health care provider. Make sure you discuss any questions you have with your health care provider. Document Released: 09/02/2000 Document Revised: 06/28/2017 Document Reviewed: 11/17/2015 Elsevier Interactive Patient Education  2019 [employer].

## 2018-10-18 NOTE — Progress Notes (Signed)
Patient ID: Ryan Blevins, male   DOB: 05-05-1951, 68 y.o.   MRN: [id number]  Chief Complaint  Patient presents with  . Follow-up    rectal bleeding- discuss colonoscopy    HPI Ryan Blevins is a 68 y.o. male.  Here to discuss having a colonoscopy. Last completed 02-20-09. Rectal bleeding, when constipated, he feels is related to hemorrhoids, he notices this about once a month. He often has diarrhea, twice a month.  HPI  Past Medical History:  Diagnosis Date  . Depression   . Hemorrhoid   . Hyperlipidemia   . Hypertension   . Hyperuricemia   . Low testosterone 2011  . Prostate cancer ([place]) 2011   per Dr. Risa Grill.  Seed implant per Dr. Valere Dross  . Skin cancer   . Syncope     Past Surgical History:  Procedure Laterality Date  . COLONOSCOPY  02/20/2009   Dr Bary Castilla    Family History  Problem Relation Age of Onset  . Heart disease Mother        Multiple heart surgeries, valve repair  . Osteoporosis Mother        Vertebral fracture  . Stroke Mother   . Hypertension Father   . Hyperlipidemia Father   . Diabetes Father   . Parkinson's disease Father   . Colon cancer Neg Hx     Social History Social History   Tobacco Use  . Smoking status: Never Smoker  . Smokeless tobacco: Never Used  Substance Use Topics  . Alcohol use: Yes    Comment: Occasional  . Drug use: No    Allergies  Allergen Reactions  . Atorvastatin     REACTION: but tolerant of crestor- this is the only statin he tolerates  . Bupropion Hcl     REACTION: mood changes  . Codeine   . Sildenafil     Flushing, tachycardia    Current Outpatient Medications  Medication Sig Dispense Refill  . colchicine 0.6 MG tablet Take 1 tablet (0.6 mg total) by mouth daily as needed. 30 tablet 0  . fluticasone (FLONASE) 50 MCG/ACT nasal spray Place 2 sprays into both nostrils daily. 16 g 6  . lisinopril (PRINIVIL,ZESTRIL) 30 MG tablet TAKE 1 TABLET(30 MG) BY MOUTH DAILY 90 tablet 3  . rosuvastatin  (CRESTOR) 10 MG tablet 1 tab daily. 90 tablet 3  . tamsulosin (FLOMAX) 0.4 MG CAPS capsule Take 1 capsule (0.4 mg total) by mouth daily. 90 capsule 3  . venlafaxine XR (EFFEXOR-XR) 75 MG 24 hr capsule Take 2 capsules (150 mg total) by mouth daily. 180 capsule 3  . polyethylene glycol powder (GLYCOLAX/MIRALAX) powder 255 grams one bottle for colonoscopy prep 255 g 0   No current facility-administered medications for this visit.     Review of Systems Review of Systems  Constitutional: Negative.   Respiratory: Negative.   Cardiovascular: Negative.   Gastrointestinal: Negative for diarrhea.    Blood pressure (!) 154/87, pulse 95, temperature (!) 97.3 F (36.3 C), temperature source Temporal, resp. rate 18, height 6' (1.829 m), weight 217 lb 3.2 oz (98.5 kg), SpO2 95 %.  Physical Exam Physical Exam Vitals signs reviewed.  Constitutional:      Appearance: Normal appearance. He is normal weight.  HENT:     Head: Normocephalic and atraumatic.  Neck:     Musculoskeletal: Normal range of motion and neck supple.  Cardiovascular:     Rate and Rhythm: Regular rhythm.  Pulmonary:     Effort: Pulmonary effort  is normal.     Breath sounds: Normal breath sounds.  Genitourinary:   Lymphadenopathy:     Lower Body: No right inguinal adenopathy. No left inguinal adenopathy.  Neurological:     Mental Status: He is alert.     Data Reviewed Digital rectal exam showed no discernible nodularity or focal tenderness.  No evidence of residual prostate tissue.  Normal sphincter tone.  Anoscopy was completed.  Mildly prominent internal hemorrhoids circumferentially, all less than 1 cm.  Vascular without active bleeding.  There is no evidence of an anal fissure (the patient reports occasional stinging after bowel movements which is short-lived).  Colonoscopy February 20, 2009 was normal.  This was before his treatment for prostate cancer in 2011.    Assessment    Intermittent rectal bleeding since  radiation treatment for his prostate cancer, unchanged over the past 5 years.  Candidate for screening colonoscopy.    Plan    The patient may benefit from the use of a glycerin suppository before bowel movements to minimize bleeding from the mildly prominent ring of internal hemorrhoids.  These are all fairly small and I do not think would do well with banding.  He rarely has any nocturnal episodes and I think that surgical intervention may be associated with more side effects than benefit. Colonoscopy with possible biopsy/polypectomy prn: Information regarding the procedure, including its potential risks and complications (including but not limited to perforation of the bowel, which may require emergency surgery to repair, and bleeding) was verbally given to the patient. Educational information regarding lower intestinal endoscopy was given to the patient. Written instructions for how to complete the bowel prep using Miralax were provided. The importance of drinking ample fluids to avoid dehydration as a result of the prep emphasized.     HPI has been scribed under the direction and in the presence of [name], MD. Karie Fetch, RN I have completed the exam and reviewed the above documentation for accuracy and completeness.  I agree with the above.  Haematologist has been used and any errors in dictation or transcription are unintentional.  Hervey Ard, M.D., F.A.C.S.  Ryan Blevins 10/18/2018, 3:48 PM

## 2018-10-19 DIAGNOSIS — N5201 Erectile dysfunction due to arterial insufficiency: Secondary | ICD-10-CM | POA: Diagnosis not present

## 2018-10-19 DIAGNOSIS — Z8546 Personal history of malignant neoplasm of prostate: Secondary | ICD-10-CM | POA: Diagnosis not present

## 2018-10-22 ENCOUNTER — Encounter: Payer: Self-pay | Admitting: *Deleted

## 2018-10-22 ENCOUNTER — Other Ambulatory Visit: Payer: Self-pay | Admitting: Pharmacist

## 2018-10-22 NOTE — Progress Notes (Signed)
Patient has been scheduled for a colonoscopy on 10-24-18 at [facility]. Miralax prescription has been sent in to the patient's pharmacy. Colonoscopy instructions have been reviewed with the patient last week. This patient is aware to call the office if they have further questions.

## 2018-10-22 NOTE — Patient Outreach (Signed)
[facility]) Care Management  [place]   10/22/2018  Ryan Blevins 1951-02-12 [id number]  [facility] Medication: lisinopril 30 mg Date & Supply of last refill: 08/21/18, 90 day supply Current insurance:[employer]   Outreach:  Incoming call from [name] in response to the [facility] Medication Adherence Campaign. Speak with patient. HIPAA identifiers verified.  Subjective:   Ryan Blevins reports that he has been taking his lisinopril 30 mg once daily as directed. Denies any barriers to adherence. Reports that his pharmacy has been working to get all of his medications synced to be refilled together for 90 day supplies. Counsel patient on the importance of medication adherence.  Patient denies any further medication questions/concerns at this time.    Objective: Lab Results  Component Value Date   CREATININE 1.23 08/23/2018   CREATININE 1.18 08/16/2017   CREATININE 1.24 07/26/2017    No results found for: HGBA1C  Lipid Panel     Component Value Date/Time   CHOL 144 08/23/2018 0805   TRIG 239.0 (H) 08/23/2018 0805   HDL 36.10 (L) 08/23/2018 0805   CHOLHDL 4 08/23/2018 0805   VLDL 47.8 (H) 08/23/2018 0805   LDLCALC 84 08/16/2017 0756   LDLDIRECT 76.0 08/23/2018 0805    BP Readings from Last 3 Encounters:  10/18/18 (!) 154/87  08/30/18 136/86  04/25/18 (!) 152/82    Allergies  Allergen Reactions  . Atorvastatin     REACTION: but tolerant of crestor- this is the only statin he tolerates  . Bupropion Hcl     REACTION: mood changes  . Codeine   . Sildenafil     Flushing, tachycardia     Assessment:  No current barriers identified   Plan:  Will close pharmacy episode.   Harlow Asa, PharmD, [facility] [phone]

## 2018-10-24 ENCOUNTER — Encounter
Admission: RE | Disposition: A | Discharge status: Home / Self Care | Payer: Self-pay | Source: Home / Self Care | Attending: General Surgery

## 2018-10-24 ENCOUNTER — Ambulatory Visit: Payer: PPO | Admitting: Anesthesiology

## 2018-10-24 ENCOUNTER — Ambulatory Visit
Admission: RE | Admit: 2018-10-24 | Discharge: 2018-10-24 | Disposition: A | Discharge status: Home / Self Care | Payer: PPO | Attending: General Surgery | Admitting: General Surgery

## 2018-10-24 DIAGNOSIS — I10 Essential (primary) hypertension: Secondary | ICD-10-CM | POA: Insufficient documentation

## 2018-10-24 DIAGNOSIS — Z8546 Personal history of malignant neoplasm of prostate: Secondary | ICD-10-CM | POA: Diagnosis not present

## 2018-10-24 DIAGNOSIS — D123 Benign neoplasm of transverse colon: Secondary | ICD-10-CM | POA: Diagnosis not present

## 2018-10-24 DIAGNOSIS — K648 Other hemorrhoids: Secondary | ICD-10-CM | POA: Insufficient documentation

## 2018-10-24 DIAGNOSIS — Z85828 Personal history of other malignant neoplasm of skin: Secondary | ICD-10-CM | POA: Insufficient documentation

## 2018-10-24 DIAGNOSIS — K621 Rectal polyp: Secondary | ICD-10-CM | POA: Insufficient documentation

## 2018-10-24 DIAGNOSIS — Z1211 Encounter for screening for malignant neoplasm of colon: Secondary | ICD-10-CM

## 2018-10-24 DIAGNOSIS — K635 Polyp of colon: Secondary | ICD-10-CM | POA: Diagnosis not present

## 2018-10-24 HISTORY — PX: COLONOSCOPY WITH PROPOFOL: SHX5780

## 2018-10-24 SURGERY — COLONOSCOPY WITH PROPOFOL
Anesthesia: General

## 2018-10-24 MED ORDER — LIDOCAINE 2% (20 MG/ML) 5 ML SYRINGE
INTRAMUSCULAR | Status: DC | PRN
  Administered 2018-10-24: 50 mg via INTRAVENOUS

## 2018-10-24 MED ORDER — GLYCOPYRROLATE 0.2 MG/ML IJ SOLN
INTRAMUSCULAR | Status: DC | PRN
  Administered 2018-10-24: 0.2 mg via INTRAVENOUS

## 2018-10-24 MED ORDER — SODIUM CHLORIDE 0.9 % IV SOLN
INTRAVENOUS | Status: DC
  Administered 2018-10-24: 09:00:00 via INTRAVENOUS

## 2018-10-24 MED ORDER — PROPOFOL 500 MG/50ML IV EMUL
INTRAVENOUS | Status: DC | PRN
  Administered 2018-10-24: 200 ug/kg/min via INTRAVENOUS

## 2018-10-24 MED ORDER — PROPOFOL 10 MG/ML IV BOLUS
INTRAVENOUS | Status: AC
  Filled 2018-10-24: qty 20

## 2018-10-24 MED ORDER — PROPOFOL 500 MG/50ML IV EMUL
INTRAVENOUS | Status: AC
  Filled 2018-10-24: qty 50

## 2018-10-24 MED ORDER — PROPOFOL 10 MG/ML IV BOLUS
INTRAVENOUS | Status: DC | PRN
  Administered 2018-10-24: 80 mg via INTRAVENOUS
  Administered 2018-10-24: 20 mg via INTRAVENOUS

## 2018-10-24 NOTE — Anesthesia Preprocedure Evaluation (Addendum)
Anesthesia Evaluation  Patient identified by MRN, date of birth, ID band Patient awake    Reviewed: Allergy & Precautions, H&P , NPO status , Patient's Chart, lab work & pertinent test results  Airway Mallampati: III       Dental  (+) Teeth Intact   Pulmonary neg pulmonary ROS,           Cardiovascular hypertension,      Neuro/Psych PSYCHIATRIC DISORDERS Depression negative neurological ROS     GI/Hepatic negative GI ROS, Neg liver ROS,   Endo/Other  negative endocrine ROS  Renal/GU negative Renal ROS  negative genitourinary   Musculoskeletal   Abdominal   Peds  Hematology negative hematology ROS (+)   Anesthesia Other Findings Past Medical History: No date: Depression No date: Hemorrhoid No date: Hyperlipidemia No date: Hypertension No date: Hyperuricemia 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 Risa Grill.  Seed implant per Dr. Valere Dross No date: Skin cancer No date: Syncope  Past Surgical History: 02/20/2009: COLONOSCOPY     Comment:  Dr Bary Castilla  BMI    Body Mass Index:  29.16 kg/m      Reproductive/Obstetrics negative OB ROS                            Anesthesia Physical Anesthesia Plan  ASA: II  Anesthesia Plan: General   Post-op Pain Management:    Induction:   PONV Risk Score and Plan: Propofol infusion and TIVA  Airway Management Planned: Natural Airway  Additional Equipment:   Intra-op Plan:   Post-operative Plan:   Informed Consent: I have reviewed the patients History and Physical, chart, labs and discussed the procedure including the risks, benefits and alternatives for the proposed anesthesia with the patient or authorized representative who has indicated his/her understanding and acceptance.     Dental Advisory Given  Plan Discussed with: Anesthesiologist and CRNA  Anesthesia Plan Comments:         Anesthesia  Quick Evaluation

## 2018-10-24 NOTE — Transfer of Care (Signed)
Immediate Anesthesia Transfer of Care Note  Patient: Ryan Blevins  Procedure(s) Performed: COLONOSCOPY WITH PROPOFOL (N/A )  Patient Location: Endoscopy Unit  Anesthesia Type:General  Level of Consciousness: sedated  Airway & Oxygen Therapy: Patient connected to nasal cannula oxygen  Post-op Assessment: Post -op Vital signs reviewed and stable  Post vital signs: stable  Last Vitals:  Vitals Value Taken Time  BP 124/81 10/24/2018 10:17 AM  Temp 36.3 C 10/24/2018 10:18 AM  Pulse 85 10/24/2018 10:20 AM  Resp 36 10/24/2018 10:20 AM  SpO2 95 % 10/24/2018 10:20 AM  Vitals shown include unvalidated device data.  Last Pain:  Vitals:   10/24/18 1018  TempSrc: Tympanic         Complications: No apparent anesthesia complications

## 2018-10-24 NOTE — H&P (Signed)
No change in clinical history or exam.  Tolerated prep well.  For screening colonoscopy.

## 2018-10-24 NOTE — Op Note (Signed)
[facility] Patient Name: Ryan Blevins Procedure Date: 10/24/2018 9:36 AM MRN: [id number] Account #: [id number] Date of Birth: 1950-12-19 Admit Type: Outpatient Age: 68 Room: [facility] ENDO ROOM 1 Gender: Male Note Status: Finalized Procedure:            Colonoscopy Indications:          Screening for colorectal malignant neoplasm Providers:            [name], MD Referring MD:         Elveria Rising. Damita Dunnings, MD (Referring MD) Medicines:            Monitored Anesthesia Care Complications:        No immediate complications. Procedure:            Pre-Anesthesia Assessment:                       - Prior to the procedure, a History and Physical was                        performed, and patient medications, allergies and                        sensitivities were reviewed. The patient's tolerance of                        previous anesthesia was reviewed.                       - The risks and benefits of the procedure and the                        sedation options and risks were discussed with the                        patient. All questions were answered and informed                        consent was obtained.                       - Prior to the procedure, a History and Physical was                        performed, and patient medications, allergies and                        sensitivities were reviewed. The patient's tolerance of                        previous anesthesia was reviewed.                       - The risks and benefits of the procedure and the                        sedation options and risks were discussed with the                        patient. All questions were answered and informed  consent was obtained.                       After obtaining informed consent, the colonoscope was                        passed under direct vision. Throughout the procedure,                        the patient's blood  pressure, pulse, and oxygen                        saturations were monitored continuously. The                        Colonoscope was introduced through the anus and                        advanced to the the cecum, identified by appendiceal                        orifice and ileocecal valve. The colonoscopy was                        performed without difficulty. The patient tolerated the                        procedure well. The quality of the bowel preparation                        was excellent. Findings:      A 15 mm polyp was found in the transverse colon proximal transverse       colon. The polyp was pedunculated. The polyp was removed with a hot       snare. Resection and retrieval were complete.      A 6 mm polyp was found in the rectum. The polyp was sessile. The polyp       was removed with a hot snare. Resection and retrieval were complete.      Internal hemorrhoids were found during retroflexion. The hemorrhoids       were mild. Impression:           - One 15 mm polyp in the transverse colon in the                        proximal transverse colon, removed with a hot snare.                        Resected and retrieved.                       - One 6 mm polyp in the rectum, removed with a hot                        snare. Resected and retrieved.                       - Internal hemorrhoids. Recommendation:       - Telephone endoscopist for pathology results in 1 week. Procedure Code(s):    --- Professional ---  45385, Colonoscopy, flexible; with removal of tumor(s),                        polyp(s), or other lesion(s) by snare technique Diagnosis Code(s):    --- Professional ---                       Z12.11, Encounter for screening for malignant neoplasm                        of colon                       D12.3, Benign neoplasm of transverse colon (hepatic                        flexure or splenic flexure)                       K62.1, Rectal polyp                        K64.8, Other hemorrhoids CPT copyright 2018 American Medical Association. All rights reserved. The codes documented in this report are preliminary and upon coder review may  be revised to meet current compliance requirements. [name], MD 10/24/2018 10:16:23 AM This report has been signed electronically. Number of Addenda: 0 Note Initiated On: 10/24/2018 9:36 AM Scope Withdrawal Time: 0 hours 22 minutes 17 seconds  Total Procedure Duration: 0 hours 28 minutes 39 seconds       [facility]

## 2018-10-24 NOTE — Anesthesia Post-op Follow-up Note (Signed)
Anesthesia QCDR form completed.        

## 2018-10-25 NOTE — Anesthesia Postprocedure Evaluation (Signed)
Anesthesia Post Note  Patient: Ryan Blevins  Procedure(s) Performed: COLONOSCOPY WITH PROPOFOL (N/A )  Patient location during evaluation: PACU Anesthesia Type: General Level of consciousness: awake and alert Pain management: pain level controlled Vital Signs Assessment: post-procedure vital signs reviewed and stable Respiratory status: spontaneous breathing, nonlabored ventilation, respiratory function stable and patient connected to nasal cannula oxygen Cardiovascular status: blood pressure returned to baseline and stable Postop Assessment: no apparent nausea or vomiting Anesthetic complications: no     Last Vitals:  Vitals:   10/24/18 1038 10/24/18 1048  BP: (!) 141/89 (!) 145/95  Pulse: 85 78  Resp: 16 15  Temp:    SpO2: 99% 98%    Last Pain:  Vitals:   10/25/18 0730  TempSrc:   PainSc: 0-No pain                 Durenda Hurt

## 2018-10-26 LAB — SURGICAL PATHOLOGY

## 2018-10-29 ENCOUNTER — Telehealth: Payer: Self-pay | Admitting: *Deleted

## 2018-10-29 NOTE — Telephone Encounter (Signed)
Patient notified as instructed per Dr. Bary Castilla.   The patient verbalizes understanding.   Patient placed in the recalls for 3 years.

## 2018-10-29 NOTE — Telephone Encounter (Signed)
-----   Message from [name], MD sent at 10/29/2018 12:37 PM EST ----- Please notify the patient that the report was fine, but he should plan on a repeat exam in three years.   Thanks.  ----- Message ----- From: Interface, Lab In Three Zero One Sent: 10/26/2018   2:40 PM EST To: [name], MD

## 2018-11-01 ENCOUNTER — Other Ambulatory Visit: Payer: Self-pay | Admitting: Family Medicine

## 2018-11-22 ENCOUNTER — Other Ambulatory Visit: Payer: Self-pay | Admitting: Family Medicine

## 2019-02-03 ENCOUNTER — Encounter: Payer: Self-pay | Admitting: Family Medicine

## 2019-02-04 ENCOUNTER — Ambulatory Visit (INDEPENDENT_AMBULATORY_CARE_PROVIDER_SITE_OTHER): Payer: PPO | Admitting: Family Medicine

## 2019-02-04 ENCOUNTER — Other Ambulatory Visit: Payer: Self-pay

## 2019-02-04 ENCOUNTER — Encounter: Payer: Self-pay | Admitting: Family Medicine

## 2019-02-04 DIAGNOSIS — R0602 Shortness of breath: Secondary | ICD-10-CM

## 2019-02-04 NOTE — Telephone Encounter (Signed)
Can you set him up on a Virtual Visit with Dr Damita Dunnings for minimal shortness of breath and numbness / tingling in my left fingers 30 mins. Thanks

## 2019-02-04 NOTE — Progress Notes (Signed)
Interactive audio and video telecommunications were attempted between this provider and patient, however failed, due to patient having technical difficulties OR patient did not have access to video capability.  We continued and completed visit with audio only.   Virtual Visit via Telephone Note  I connected with patient on 02/04/19 at 12:12 PM by telephone and verified that I am speaking with the correct person using two identifiers.  Location of patient: at work.    Location of MD: [facility] Name of referring provider (if blank then none associated): Names per persons and role in encounter:  MD: Earlyne Iba, Patient: name listed above.    I discussed the limitations, risks, security and privacy concerns of performing an evaluation and management service by telephone and the availability of in person appointments. I also discussed with the patient that there may be a patient responsible charge related to this service. The patient expressed understanding and agreed to proceed.  CC: hand tingling/SOB.   History of Present Illness:  Step daughter recently needed cards eval re: syncope.  Stepdaughter's father is dying on hospice.  Condolences offered.    He can occ feel his pulse in his throat.  He has some elevated heart rate with exertion.  He has been relatively sedentary.  He has noted some mild SOB with exertion over the last month.  No CP.  Not SOB now. No BLE edema.   He had seen Glenetta Hew with cards prev, year ago.   He has L 4th and 5th finger numbness with persistent arm elevation, not brief elevation.  Resolves with lowering the arm.  No neck pain but some cramping in the neck (though not as bad recently).  No R sided sx.  No leg sx.  Some occ L hand numbness along 4th and 5th ray.  No sx with upward gaze.  No weakness.  Sx going on intermittently for about 1 month but not escalating.  R handed.  No clear trigger d/w pt.   In retrospect he has had some episodic L arm  numbness for the last few years that would improve with repositioning.    PMH, SH FH reviewed.   H/o neg stress test years ago, d/w pt.   Observations/Objective: nad Speech wnl.   Assessment and Plan:  Hand sx.  This sounds to be mechanical, not ominous, and if it doesn't resolve he can follow up here and we can image if needed.  I think this is unrelated to his other complaint.   Discussed.   SOB with exertion.  I called cards about options for follow up.   We can get him in for EKG/labs/exam, but may still end up needing cards eval and patient asked about getting eval done with one OV, with cards.  I put in referral at this point to get cards eval set up.   Routine cautions given to patient, if any CP then go to ER.  He agrees.   Follow Up Instructions: see above.    I discussed the assessment and treatment plan with the patient. The patient was provided an opportunity to ask questions and all were answered. The patient agreed with the plan and demonstrated an understanding of the instructions.   The patient was advised to call back or seek an in-person evaluation if the symptoms worsen or if the condition fails to improve as anticipated.  I provided 20 minutes of non-face-to-face time during this encounter.  Elsie Stain, MD

## 2019-02-04 NOTE — Telephone Encounter (Signed)
Scheduled pt for today 02/04/19 @ 12pm

## 2019-02-05 ENCOUNTER — Other Ambulatory Visit: Payer: Self-pay

## 2019-02-05 ENCOUNTER — Encounter (HOSPITAL_COMMUNITY): Payer: Self-pay

## 2019-02-05 ENCOUNTER — Emergency Department (HOSPITAL_COMMUNITY)
Admission: EM | Admit: 2019-02-05 | Discharge: 2019-02-05 | Discharge status: Against Medical Advice | Payer: PPO | Attending: Emergency Medicine | Admitting: Emergency Medicine

## 2019-02-05 ENCOUNTER — Emergency Department (HOSPITAL_COMMUNITY): Payer: PPO

## 2019-02-05 DIAGNOSIS — R0602 Shortness of breath: Secondary | ICD-10-CM | POA: Diagnosis not present

## 2019-02-05 DIAGNOSIS — Z5321 Procedure and treatment not carried out due to patient leaving prior to being seen by health care provider: Secondary | ICD-10-CM | POA: Diagnosis not present

## 2019-02-05 LAB — BASIC METABOLIC PANEL
Anion gap: 12 (ref 5–15)
BUN: 11 mg/dL (ref 8–23)
CO2: 24 mmol/L (ref 22–32)
Calcium: 9.6 mg/dL (ref 8.9–10.3)
Chloride: 100 mmol/L (ref 98–111)
Creatinine, Ser: 1.24 mg/dL (ref 0.61–1.24)
GFR calc Af Amer: >60 mL/min (ref >60)
GFR calc non Af Amer: 60 mL/min — ABNORMAL LOW (ref >60)
Glucose, Bld: 117 mg/dL — ABNORMAL HIGH (ref 70–99)
Potassium: 3.9 mmol/L (ref 3.5–5.1)
Sodium: 136 mmol/L (ref 135–145)

## 2019-02-05 LAB — CBC
HCT: 47.9 % (ref 39.0–52.0)
Hemoglobin: 15.8 g/dL (ref 13.0–17.0)
MCH: 30.2 pg (ref 26.0–34.0)
MCHC: 33 g/dL (ref 30.0–36.0)
MCV: 91.4 fL (ref 80.0–100.0)
Platelets: 291 10*3/uL (ref 150–400)
RBC: 5.24 MIL/uL (ref 4.22–5.81)
RDW: 13.2 % (ref 11.5–15.5)
WBC: 6.8 10*3/uL (ref 4.0–10.5)
nRBC: 0 % (ref 0.0–0.2)

## 2019-02-05 LAB — TROPONIN I: Troponin I: <0.03 ng/mL (ref <0.03)

## 2019-02-05 MED ORDER — SODIUM CHLORIDE 0.9% FLUSH: 3.0000 mL | Freq: Once | INTRAVENOUS | Status: DC

## 2019-02-05 NOTE — ED Triage Notes (Signed)
Pt reports 1 month of SOB and left arm pain and numbness. Pt has consult on Friday with cardiology but SOB has gotten worse. Pt able to speak in complete sentences, denies any chest pain but intermittent upper back pain. Pt a.o, nad noted.

## 2019-02-05 NOTE — ED Notes (Signed)
Patient stated that he was tired of waiting to see the Dr., and will follow up with his cardiologist on Friday as he already has an appt. Pt encouraged to stay in case he needs to be ruled out for PE or other Cardiac issues. Still states that he is leaving.

## 2019-02-06 ENCOUNTER — Telehealth: Payer: Self-pay | Admitting: Internal Medicine

## 2019-02-06 ENCOUNTER — Telehealth: Payer: Self-pay | Admitting: Family Medicine

## 2019-02-06 NOTE — Telephone Encounter (Signed)
Check on patient. Per EMR, left ER prior to eval.  Has cards f/u pending.  Thanks.

## 2019-02-06 NOTE — Telephone Encounter (Signed)
Mychart, smartphone, consent (verbal), pre reg complete 02/06/19 AF

## 2019-02-07 ENCOUNTER — Telehealth: Payer: Self-pay

## 2019-02-07 NOTE — Telephone Encounter (Signed)
Virtual Visit Pre-Appointment Phone Call  "(Name), I am calling you today to discuss your upcoming appointment. We are currently trying to limit exposure to the virus that causes COVID-19 by seeing patients at home rather than in the office."  1. "What is the BEST phone number to call the day of the visit?" - include this in appointment notes  2. "Do you have or have access to (through a family member/friend) a smartphone with video capability that we can use for your visit?" a. If yes - list this number in appt notes as "cell" (if different from BEST phone #) and list the appointment type as a VIDEO visit in appointment notes b. If no - list the appointment type as a PHONE visit in appointment notes  3. Confirm consent - "In the setting of the current Covid19 crisis, you are scheduled for a (phone or video) visit with your provider on (date) at (time).  Just as we do with many in-office visits, in order for you to participate in this visit, we must obtain consent.  If you'd like, I can send this to your mychart (if signed up) or email for you to review.  Otherwise, I can obtain your verbal consent now.  All virtual visits are billed to your insurance company just like a normal visit would be.  By agreeing to a virtual visit, we'd like you to understand that the technology does not allow for your provider to perform an examination, and thus may limit your provider's ability to fully assess your condition. If your provider identifies any concerns that need to be evaluated in person, we will make arrangements to do so.  Finally, though the technology is pretty good, we cannot assure that it will always work on either your or our end, and in the setting of a video visit, we may have to convert it to a phone-only visit.  In either situation, we cannot ensure that we have a secure connection.  Are you willing to proceed?" STAFF: Did the patient verbally acknowledge consent to telehealth visit? Document  YES/NO here: consent obtained by Carron Curie yesterday, 02/06/19  4. Advise patient to be prepared - "Two hours prior to your appointment, go ahead and check your blood pressure, pulse, oxygen saturation, and your weight (if you have the equipment to check those) and write them all down. When your visit starts, your provider will ask you for this information. If you have an Apple Watch or Kardia device, please plan to have heart rate information ready on the day of your appointment. Please have a pen and paper handy nearby the day of the visit as well."  5. Give patient instructions for MyChart download to smartphone OR Doximity/Doxy.me as below if video visit (depending on what platform provider is using)  6. Inform patient they will receive a phone call 15 minutes prior to their appointment time (may be from unknown caller ID) so they should be prepared to answer    TELEPHONE CALL NOTE  Ryan Blevins has been deemed a candidate for a follow-up tele-health visit to limit community exposure during the Covid-19 pandemic. I spoke with the patient via phone to ensure availability of phone/video source, confirm preferred email & phone number, and discuss instructions and expectations.  I reminded Ryan Blevins to be prepared with any vital sign and/or heart rhythm information that could potentially be obtained via home monitoring, at the time of his visit. I reminded Ryan Blevins to  expect a phone call prior to his visit.  [name], [place] 02/07/2019 2:41 PM   INSTRUCTIONS FOR DOWNLOADING THE MYCHART APP TO SMARTPHONE  - The patient must first make sure to have activated MyChart and know their login information - If Apple, go to [employer] and type in MyChart in the search bar and download the app. If Android, ask patient to go to [employer] and type in [place] in the search bar and download the app. The app is free but as with any other app downloads, their phone may  require them to verify saved payment information or Apple/Android password.  - The patient will need to then log into the app with their MyChart username and password, and select [facility] as their healthcare provider to link the account. When it is time for your visit, go to the MyChart app, find appointments, and click Begin Video Visit. Be sure to Select Allow for your device to access the Microphone and Camera for your visit. You will then be connected, and your provider will be with you shortly.  **If they have any issues connecting, or need assistance please contact MyChart service desk [phone])**  **If using a computer, in order to ensure the best quality for their visit they will need to use either of the following Internet Browsers: [employer], or Google Chrome**  IF USING DOXIMITY or DOXY.ME - The patient will receive a link just prior to their visit by text.     FULL LENGTH CONSENT FOR TELE-HEALTH VISIT   I hereby voluntarily request, consent and authorize [facility] and its employed or contracted physicians, physician assistants, nurse practitioners or other licensed health care professionals (the Practitioner), to provide me with telemedicine health care services (the "Services") as deemed necessary by the treating Practitioner. I acknowledge and consent to receive the Services by the Practitioner via telemedicine. I understand that the telemedicine visit will involve communicating with the Practitioner through live audiovisual communication technology and the disclosure of certain medical information by electronic transmission. I acknowledge that I have been given the opportunity to request an in-person assessment or other available alternative prior to the telemedicine visit and am voluntarily participating in the telemedicine visit.  I understand that I have the right to withhold or withdraw my consent to the use of telemedicine in the course of my care at  any time, without affecting my right to future care or treatment, and that the Practitioner or I may terminate the telemedicine visit at any time. I understand that I have the right to inspect all information obtained and/or recorded in the course of the telemedicine visit and may receive copies of available information for a reasonable fee.  I understand that some of the potential risks of receiving the Services via telemedicine include:  Marland Kitchen Delay or interruption in medical evaluation due to technological equipment failure or disruption; . Information transmitted may not be sufficient (e.g. poor resolution of images) to allow for appropriate medical decision making by the Practitioner; and/or  . In rare instances, security protocols could fail, causing a breach of personal health information.  Furthermore, I acknowledge that it is my responsibility to provide information about my medical history, conditions and care that is complete and accurate to the best of my ability. I acknowledge that Practitioner's advice, recommendations, and/or decision may be based on factors not within their control, such as incomplete or inaccurate data provided by me or distortions of diagnostic images or specimens that  may result from electronic transmissions. I understand that the practice of medicine is not an exact science and that Practitioner makes no warranties or guarantees regarding treatment outcomes. I acknowledge that I will receive a copy of this consent concurrently upon execution via email to the email address I last provided but may also request a printed copy by calling the office of [facility].    I understand that my insurance will be billed for this visit.   I have read or had this consent read to me. . I understand the contents of this consent, which adequately explains the benefits and risks of the Services being provided via telemedicine.  . I have been provided ample opportunity to ask questions  regarding this consent and the Services and have had my questions answered to my satisfaction. . I give my informed consent for the services to be provided through the use of telemedicine in my medical care  By participating in this telemedicine visit I agree to the above.1

## 2019-02-07 NOTE — Telephone Encounter (Signed)
Left message for pt to call office.  If he calls back, please ask him how he is feeling and note it here.  There are multiple people working out of this box so the message needs to stay in this box. Thanks

## 2019-02-08 ENCOUNTER — Telehealth (INDEPENDENT_AMBULATORY_CARE_PROVIDER_SITE_OTHER): Payer: PPO | Admitting: Internal Medicine

## 2019-02-08 ENCOUNTER — Encounter: Payer: Self-pay | Admitting: Internal Medicine

## 2019-02-08 ENCOUNTER — Telehealth: Payer: Self-pay | Admitting: Internal Medicine

## 2019-02-08 DIAGNOSIS — R0609 Other forms of dyspnea: Secondary | ICD-10-CM | POA: Insufficient documentation

## 2019-02-08 DIAGNOSIS — I10 Essential (primary) hypertension: Secondary | ICD-10-CM | POA: Diagnosis not present

## 2019-02-08 DIAGNOSIS — E782 Mixed hyperlipidemia: Secondary | ICD-10-CM

## 2019-02-08 DIAGNOSIS — Z7189 Other specified counseling: Secondary | ICD-10-CM

## 2019-02-08 DIAGNOSIS — R0600 Dyspnea, unspecified: Secondary | ICD-10-CM | POA: Insufficient documentation

## 2019-02-08 NOTE — Patient Instructions (Signed)
Medication Instructions:  Your physician recommends that you continue on your current medications as directed. Please refer to the Current Medication list given to you today.  If you need a refill on your cardiac medications before your next appointment, please call your pharmacy.   Testing/Procedures: Your physician has requested that you have an echocardiogram. Echocardiography is a painless test that uses sound waves to create images of your heart. It provides your doctor with information about the size and shape of your heart and how well your heart's chambers and valves are working. This procedure takes approximately one hour. There are no restrictions for this procedure.  Dr. Debara Pickett has ordered a (exercise) Myocardial Perfusion Imaging Study.   The test will take approximately 3 to 4 hours to complete; you may bring reading material.  If someone comes with you to your appointment, they will need to remain in the main lobby due to limited space in the testing area.    How to prepare for your Myocardial Perfusion Test:  Do not eat or drink 3 hours prior to your test, except you may have water.  Do not consume products containing caffeine (regular or decaffeinated) 12 hours prior to your test. (ex: coffee, chocolate, sodas, tea).  Do wear comfortable clothes (no dresses or overalls) and walking shoes, tennis shoes preferred (No heels or open toe shoes are allowed).  Do NOT wear cologne, perfume, aftershave, or lotions (deodorant is allowed).  If these instructions are not followed, your test will have to be rescheduled.   Follow-Up: At [facility], you and your health needs are our priority.  As part of our continuing mission to provide you with exceptional heart care, we have created designated Provider Care Teams.  These Care Teams include your primary Cardiologist (physician) and Advanced Practice Providers (APPs -  Physician Assistants and Nurse Practitioners) who all work together  to provide you with the care you need, when you need it. You will need a follow up appointment in 4-6 weeks (after echo & stress test). You may see Dr. Debara Pickett or one of the following Advanced Practice Providers on your designated Care Team: Almyra Deforest, [place] . Fabian Sharp, PA-C

## 2019-02-08 NOTE — Progress Notes (Signed)
Virtual Visit via Video Note   This visit type was conducted due to national recommendations for restrictions regarding the COVID-19 Pandemic (e.g. social distancing) in an effort to limit this patient's exposure and mitigate transmission in our community.  Due to his co-morbid illnesses, this patient is at least at moderate risk for complications without adequate follow up.  This format is felt to be most appropriate for this patient at this time.  All issues noted in this document were discussed and addressed.  A limited physical exam was performed with this format.  Please refer to the patient's chart for his consent to telehealth for [facility].   Evaluation Performed:  Doxy.me video visit  Date:  02/08/2019   ID:  Ryan Blevins, Ryan Blevins, Ryan Blevins, Ryan Blevins  Patient Location:  [place] [address]  Provider location:   [address] [place],  [address]  PCP:  Tonia Ghent, MD  Cardiologist:  No primary care provider on file. Electrophysiologist:  None   Chief Complaint:  New patient, dyspnea  History of Present Illness:    Ryan Blevins is a 68 y.o. male who presents via audio/video conferencing for a telehealth visit today.  Ryan Blevins was seen today as a new patient visit.  This was for shortness of breath with exertion.  He has not noted any shortness of breath with exertion in the past except for when he was diagnosed with prostate cancer in 2011.  He was successfully treated.  Recently has had some progressive shortness of breath with exertion.  He does report some tingling in the left 2 fingers on his arm but denies any chest pain associated with that.  He was seen in the emergency department and had a negative troponin.  EKG did show some poor wave progression anteriorly and borderline Q waves inferiorly.  Otherwise no ischemic changes.  A hest x-ray was also performed which was normal. His PCP then referred  him for coronary evaluation.  He does have a history of hypertension and dyslipidemia as well a history of heart disease in his mother at an older age.  The patient does not have symptoms concerning for COVID-19 infection (fever, chills, cough, or new SHORTNESS OF BREATH).    Prior CV studies:   The following studies were reviewed today:  Chart review  PMHx:  Past Medical History:  Diagnosis Date   Depression    Hemorrhoid    Hyperlipidemia    Hypertension    Hyperuricemia    Low testosterone 2011   Prostate cancer ([place]) 2011   per Dr. Risa Grill.  Seed implant per Dr. Valere Dross   Skin cancer    Syncope     Past Surgical History:  Procedure Laterality Date   COLONOSCOPY  02/20/2009   Dr Bary Castilla   COLONOSCOPY WITH PROPOFOL N/A 10/24/2018   Procedure: COLONOSCOPY WITH PROPOFOL;  Surgeon: [name], MD;  Location: [facility] ENDOSCOPY;  Service: Endoscopy;  Laterality: N/A;    FAMHx:  Family History  Problem Relation Age of Onset   Heart disease Mother        Multiple heart surgeries, valve repair   Osteoporosis Mother        Vertebral fracture   Stroke Mother    Hypertension Father    Hyperlipidemia Father    Diabetes Father    Parkinson's disease Father    Colon cancer Neg Hx     SOCHx:   reports that he  has never smoked. He has never used smokeless tobacco. He reports current alcohol use. He reports that he does not use drugs.  ALLERGIES:  Allergies  Allergen Reactions   Atorvastatin     REACTION: but tolerant of crestor- this is the only statin he tolerates   Bupropion Hcl     REACTION: mood changes   Codeine    Sildenafil     Flushing, tachycardia    MEDS:  Current Meds  Medication Sig   colchicine 0.6 MG tablet Take 1 tablet (0.6 mg total) by mouth daily as needed.   fluticasone (FLONASE) 50 MCG/ACT nasal spray Place 2 sprays into both nostrils daily.   lisinopril (PRINIVIL,ZESTRIL) 30 MG tablet TAKE 1 TABLET BY MOUTH DAILY     rosuvastatin (CRESTOR) 10 MG tablet 1 tab daily.   tamsulosin (FLOMAX) 0.4 MG CAPS capsule TAKE 1 CAPSULE BY MOUTH EVERY DAY   venlafaxine XR (EFFEXOR-XR) 75 MG 24 hr capsule Take 2 capsules (150 mg total) by mouth daily.     ROS: Pertinent items noted in HPI and remainder of comprehensive ROS otherwise negative.  Labs/Other Tests and Data Reviewed:    Recent Labs: 08/23/2018: ALT 45 02/05/2019: BUN 11; Creatinine, Ser 1.24; Hemoglobin 15.8; Platelets 291; Potassium 3.9; Sodium 136   Recent Lipid Panel Lab Results  Component Value Date/Time   CHOL 144 08/23/2018 08:05 AM   TRIG 239.0 (H) 08/23/2018 08:05 AM   HDL 36.10 (L) 08/23/2018 08:05 AM   CHOLHDL 4 08/23/2018 08:05 AM   LDLCALC 84 08/16/2017 07:56 AM   LDLDIRECT 76.0 08/23/2018 08:05 AM    Wt Readings from Last 3 Encounters:  02/05/19 210 lb (95.3 kg)  10/24/18 215 lb (97.5 kg)  10/18/18 217 lb 3.2 oz (98.5 kg)     Exam:    Vital Signs:  There were no vitals taken for this visit.   General appearance: alert and no distress Lungs: no audible wheezes Abdomen: soft, non-tender; bowel sounds normal; no masses,  no organomegaly Extremities: extremities normal, atraumatic, no cyanosis or edema Skin: Skin color, texture, turgor normal. No rashes or lesions Neurologic: Mental status: Alert, oriented, thought content appropriate Psych: Pleasant  ASSESSMENT & PLAN:    1. Progressive dyspnea on exertion 2. Hypertension 3. Dyslipidemia  Ryan Blevins has had progressive dyspnea on exertion and a negative recent ER work-up.  Troponin was negative and EKG shows some mild abnormalities but in my review does not show any significant prior infarct or new ischemia.  Possible cardiac causes could include ischemia or valvular heart disease or possibly even structural heart disease such as cardiomyopathy.  He does not have any signs or symptoms of heart failure.  Unfortunately we cannot perform an exam via telehealth visit to  determine if he has any murmur.  I would therefore recommend an echocardiogram and a exercise Myoview stress test to further evaluate his progressive dyspnea on exertion.  He did have a stress test in 2012 which was an exercise Myoview and negative for ischemia.  Thanks again for the kind referral.  Follow-up with me in 4 to 6 weeks after testing.  COVID-19 Education: The signs and symptoms of COVID-19 were discussed with the patient and how to seek care for testing (follow up with PCP or arrange E-visit).  The importance of social distancing was discussed today.  Patient Risk:   After full review of this patients clinical status, I feel that they are at least moderate risk at this time.  Time:  Today, I have spent 25 minutes with the patient with telehealth technology discussing progressive dyspnea on exertion, hypertension, dyslipidemia..     Medication Adjustments/Labs and Tests Ordered: Current medicines are reviewed at length with the patient today.  Concerns regarding medicines are outlined above.   Tests Ordered: Orders Placed This Encounter  Procedures   MYOCARDIAL PERFUSION IMAGING   ECHOCARDIOGRAM COMPLETE    Medication Changes: No orders of the defined types were placed in this encounter.   Disposition:  in 6 week(s)  Pixie Casino, MD, [facility], [place] Director of the Advanced Lipid Disorders &  Cardiovascular Risk Reduction Clinic Diplomate of the American Board of Clinical Lipidology Attending Cardiologist  Direct Dial: [phone]   Fax: [phone]  Website:  [email or web address]  Pixie Casino, MD  02/08/2019 5:06 PM

## 2019-02-08 NOTE — Telephone Encounter (Signed)
Patient called to review e-visit instructions. Patient aware that the following changes have been made: NONE Patient aware that they will need the following labs: NONE Patient aware that they will need the following test(s): exercise myoview & echo  Patient aware he will receive a call to schedule tests & f/up with MD  No further assistance needed at this time.

## 2019-02-12 NOTE — Telephone Encounter (Signed)
Patient is scheduled this Friday for stress test and follow up with Cards next week.

## 2019-02-14 ENCOUNTER — Telehealth (HOSPITAL_COMMUNITY): Payer: Self-pay | Admitting: *Deleted

## 2019-02-14 NOTE — Telephone Encounter (Signed)
Close encounter 

## 2019-02-15 ENCOUNTER — Ambulatory Visit (HOSPITAL_COMMUNITY)
Admission: RE | Admit: 2019-02-15 | Discharge: 2019-02-15 | Disposition: A | Discharge status: Home / Self Care | Payer: PPO | Source: Ambulatory Visit | Attending: Internal Medicine | Admitting: Internal Medicine

## 2019-02-15 ENCOUNTER — Other Ambulatory Visit: Payer: Self-pay

## 2019-02-15 DIAGNOSIS — R0609 Other forms of dyspnea: Secondary | ICD-10-CM | POA: Diagnosis not present

## 2019-02-15 LAB — MYOCARDIAL PERFUSION IMAGING
LV dias vol: 70 mL (ref 62–150)
LV sys vol: 25 mL
Peak HR: 106 {beats}/min
Rest HR: 81 {beats}/min
SDS: 1
SRS: 0
SSS: 1
TID: 1.06

## 2019-02-15 MED ORDER — TECHNETIUM TC 99M TETROFOSMIN IV KIT
10.8000 | PACK | Freq: Once | INTRAVENOUS | Status: AC | PRN
  Administered 2019-02-15: 10.8 via INTRAVENOUS
  Filled 2019-02-15: qty 11

## 2019-02-15 MED ORDER — REGADENOSON 0.4 MG/5ML IV SOLN
0.4000 mg | Freq: Once | INTRAVENOUS | Status: AC
  Administered 2019-02-15: 0.4 mg via INTRAVENOUS

## 2019-02-15 MED ORDER — TECHNETIUM TC 99M TETROFOSMIN IV KIT
31.0000 | PACK | Freq: Once | INTRAVENOUS | Status: AC | PRN
  Administered 2019-02-15: 31 via INTRAVENOUS
  Filled 2019-02-15: qty 31

## 2019-02-18 ENCOUNTER — Telehealth (HOSPITAL_COMMUNITY): Payer: Self-pay

## 2019-02-18 NOTE — Telephone Encounter (Signed)

## 2019-02-19 ENCOUNTER — Ambulatory Visit (HOSPITAL_COMMUNITY): Payer: PPO | Attending: Cardiology

## 2019-02-19 ENCOUNTER — Other Ambulatory Visit: Payer: Self-pay

## 2019-02-19 DIAGNOSIS — R0609 Other forms of dyspnea: Secondary | ICD-10-CM | POA: Diagnosis not present

## 2019-03-06 ENCOUNTER — Other Ambulatory Visit (INDEPENDENT_AMBULATORY_CARE_PROVIDER_SITE_OTHER): Payer: PPO

## 2019-03-06 ENCOUNTER — Other Ambulatory Visit: Payer: Self-pay

## 2019-03-06 ENCOUNTER — Other Ambulatory Visit: Payer: PPO

## 2019-03-06 DIAGNOSIS — E785 Hyperlipidemia, unspecified: Secondary | ICD-10-CM | POA: Diagnosis not present

## 2019-03-06 DIAGNOSIS — R739 Hyperglycemia, unspecified: Secondary | ICD-10-CM

## 2019-03-06 LAB — LIPID PANEL
Cholesterol: 137 mg/dL (ref 0–200)
HDL: 33.9 mg/dL — ABNORMAL LOW (ref >39.00)
NonHDL: 102.73
Total CHOL/HDL Ratio: 4
Triglycerides: 220 mg/dL — ABNORMAL HIGH (ref 0.0–149.0)
VLDL: 44 mg/dL — ABNORMAL HIGH (ref 0.0–40.0)

## 2019-03-06 LAB — LDL CHOLESTEROL, DIRECT: Direct LDL: 69 mg/dL

## 2019-03-06 LAB — GLUCOSE, RANDOM: Glucose, Bld: 98 mg/dL (ref 70–99)

## 2019-04-18 ENCOUNTER — Other Ambulatory Visit: Payer: Self-pay

## 2019-08-13 ENCOUNTER — Encounter: Payer: Self-pay | Admitting: Family Medicine

## 2019-08-13 ENCOUNTER — Encounter: Payer: Self-pay | Admitting: *Deleted

## 2019-08-13 ENCOUNTER — Telehealth: Payer: Self-pay | Admitting: Family Medicine

## 2019-08-13 ENCOUNTER — Other Ambulatory Visit: Payer: Self-pay | Admitting: *Deleted

## 2019-08-13 MED ORDER — VENLAFAXINE HCL ER 75 MG PO CP24: 150.0000 mg | ORAL_CAPSULE | Freq: Every day | ORAL | 0 refills | Status: DC

## 2019-08-13 MED ORDER — LISINOPRIL 30 MG PO TABS: ORAL_TABLET | ORAL | 0 refills | Status: DC

## 2019-08-13 MED ORDER — ROSUVASTATIN CALCIUM 10 MG PO TABS: ORAL_TABLET | ORAL | 0 refills | Status: DC

## 2019-08-13 NOTE — Telephone Encounter (Signed)
Pt sent a message through MyChart requesting refills on his Venlafaxine, Lisinopril, and Rosuvastatin. He is also requesting a prescription for ED.

## 2019-08-13 NOTE — Telephone Encounter (Signed)
The Venlafaxine, Lisinopril and Rosuvastatin has been refilled but it is time to schedule a physical exam.  Please call the office to schedule the physical with lab work prior to the appointment.  In the future, please request these refills through your pharmacy.  They can provide the information that we need to quickly process your request.  The ED medication that you mentioned will need to be approved from Dr. Damita Dunnings and we have sent your request to him.

## 2019-08-14 NOTE — Telephone Encounter (Signed)
I need some clarification on his situation.  I thought he did not tolerate sildenafil due to flushing and an increased heart rate.   Did he tolerate Cialis/tadalafil? Please let me know. I am going to be checking my messages this morning but I will be out of the office this afternoon.  If I do not get the message before I am out of the office then I will would likely be working on this message over the holiday weekend.  Thanks.

## 2019-08-19 NOTE — Telephone Encounter (Addendum)
Left detailed message on voicemail. DPR 

## 2019-09-08 ENCOUNTER — Other Ambulatory Visit: Payer: Self-pay | Admitting: Family Medicine

## 2019-09-08 DIAGNOSIS — E790 Hyperuricemia without signs of inflammatory arthritis and tophaceous disease: Secondary | ICD-10-CM

## 2019-09-08 DIAGNOSIS — E785 Hyperlipidemia, unspecified: Secondary | ICD-10-CM

## 2019-09-08 DIAGNOSIS — C61 Malignant neoplasm of prostate: Secondary | ICD-10-CM

## 2019-09-08 DIAGNOSIS — Z125 Encounter for screening for malignant neoplasm of prostate: Secondary | ICD-10-CM

## 2019-09-10 ENCOUNTER — Other Ambulatory Visit: Payer: Self-pay

## 2019-09-10 ENCOUNTER — Other Ambulatory Visit (INDEPENDENT_AMBULATORY_CARE_PROVIDER_SITE_OTHER): Payer: PPO

## 2019-09-10 DIAGNOSIS — Z125 Encounter for screening for malignant neoplasm of prostate: Secondary | ICD-10-CM | POA: Diagnosis not present

## 2019-09-10 DIAGNOSIS — E785 Hyperlipidemia, unspecified: Secondary | ICD-10-CM

## 2019-09-10 DIAGNOSIS — E790 Hyperuricemia without signs of inflammatory arthritis and tophaceous disease: Secondary | ICD-10-CM | POA: Diagnosis not present

## 2019-09-10 LAB — LIPID PANEL
Cholesterol: 116 mg/dL (ref 0–200)
HDL: 35.2 mg/dL — ABNORMAL LOW (ref >39.00)
LDL Cholesterol: 55 mg/dL (ref 0–99)
NonHDL: 81.19
Total CHOL/HDL Ratio: 3
Triglycerides: 132 mg/dL (ref 0.0–149.0)
VLDL: 26.4 mg/dL (ref 0.0–40.0)

## 2019-09-10 LAB — COMPREHENSIVE METABOLIC PANEL
ALT: 29 U/L (ref 0–53)
AST: 25 U/L (ref 0–37)
Albumin: 4.3 g/dL (ref 3.5–5.2)
Alkaline Phosphatase: 74 U/L (ref 39–117)
BUN: 15 mg/dL (ref 6–23)
CO2: 28 mEq/L (ref 19–32)
Calcium: 10 mg/dL (ref 8.4–10.5)
Chloride: 103 mEq/L (ref 96–112)
Creatinine, Ser: 1.2 mg/dL (ref 0.40–1.50)
GFR: 60.18 mL/min (ref >60.00)
Glucose, Bld: 92 mg/dL (ref 70–99)
Potassium: 5.1 mEq/L (ref 3.5–5.1)
Sodium: 138 mEq/L (ref 135–145)
Total Bilirubin: 0.6 mg/dL (ref 0.2–1.2)
Total Protein: 6.8 g/dL (ref 6.0–8.3)

## 2019-09-10 LAB — PSA, MEDICARE: PSA: 0 ng/ml — ABNORMAL LOW (ref 0.10–4.00)

## 2019-09-10 LAB — URIC ACID: Uric Acid, Serum: 8.5 mg/dL — ABNORMAL HIGH (ref 4.0–7.8)

## 2019-09-11 ENCOUNTER — Other Ambulatory Visit: Payer: PPO

## 2019-09-16 ENCOUNTER — Other Ambulatory Visit: Payer: PPO

## 2019-09-19 ENCOUNTER — Other Ambulatory Visit: Payer: Self-pay

## 2019-09-19 ENCOUNTER — Ambulatory Visit (INDEPENDENT_AMBULATORY_CARE_PROVIDER_SITE_OTHER): Payer: PPO | Admitting: Family Medicine

## 2019-09-19 ENCOUNTER — Encounter: Payer: Self-pay | Admitting: Family Medicine

## 2019-09-19 VITALS — BP 140/80 | HR 77 | Temp 97.0°F | Ht 72.0 in | Wt 198.2 lb

## 2019-09-19 DIAGNOSIS — Z7189 Other specified counseling: Secondary | ICD-10-CM

## 2019-09-19 DIAGNOSIS — I10 Essential (primary) hypertension: Secondary | ICD-10-CM | POA: Diagnosis not present

## 2019-09-19 DIAGNOSIS — F339 Major depressive disorder, recurrent, unspecified: Secondary | ICD-10-CM | POA: Diagnosis not present

## 2019-09-19 DIAGNOSIS — E782 Mixed hyperlipidemia: Secondary | ICD-10-CM

## 2019-09-19 DIAGNOSIS — Z23 Encounter for immunization: Secondary | ICD-10-CM | POA: Diagnosis not present

## 2019-09-19 DIAGNOSIS — Z0000 Encounter for general adult medical examination without abnormal findings: Secondary | ICD-10-CM

## 2019-09-19 DIAGNOSIS — L989 Disorder of the skin and subcutaneous tissue, unspecified: Secondary | ICD-10-CM

## 2019-09-19 DIAGNOSIS — E790 Hyperuricemia without signs of inflammatory arthritis and tophaceous disease: Secondary | ICD-10-CM | POA: Diagnosis not present

## 2019-09-19 DIAGNOSIS — N529 Male erectile dysfunction, unspecified: Secondary | ICD-10-CM | POA: Diagnosis not present

## 2019-09-19 DIAGNOSIS — J309 Allergic rhinitis, unspecified: Secondary | ICD-10-CM

## 2019-09-19 MED ORDER — SILDENAFIL CITRATE 20 MG PO TABS: 20.0000 mg | ORAL_TABLET | Freq: Every day | ORAL | 12 refills | Status: DC | PRN

## 2019-09-19 MED ORDER — VENLAFAXINE HCL ER 75 MG PO CP24: 150.0000 mg | ORAL_CAPSULE | Freq: Every day | ORAL | 3 refills | Status: DC

## 2019-09-19 MED ORDER — LISINOPRIL 30 MG PO TABS: ORAL_TABLET | ORAL | 3 refills | Status: DC

## 2019-09-19 MED ORDER — TAMSULOSIN HCL 0.4 MG PO CAPS: 0.4000 mg | ORAL_CAPSULE | Freq: Every day | ORAL | 3 refills | Status: DC

## 2019-09-19 MED ORDER — FLUTICASONE PROPIONATE 50 MCG/ACT NA SUSP: 2.0000 | Freq: Every day | NASAL | 6 refills | Status: DC

## 2019-09-19 MED ORDER — COLCHICINE 0.6 MG PO TABS: 0.6000 mg | ORAL_TABLET | Freq: Every day | ORAL | 1 refills | Status: DC | PRN

## 2019-09-19 MED ORDER — ROSUVASTATIN CALCIUM 10 MG PO TABS: ORAL_TABLET | ORAL | 3 refills | Status: DC

## 2019-09-19 NOTE — Patient Instructions (Signed)
Thanks for getting a flu shot.  Update me as needed.  Take sildenafil on an empty stomach.  Take care.  Glad to see you.

## 2019-09-19 NOTE — Progress Notes (Signed)
This visit occurred during the SARS-CoV-2 public health emergency.  Safety protocols were in place, including screening questions prior to the visit, additional usage of staff PPE, and extensive cleaning of exam room while observing appropriate contact time as indicated for disinfecting solutions.  I have personally reviewed the Medicare Annual Wellness questionnaire and have noted 1. The patient's medical and social history 2. Their use of alcohol, tobacco or illicit drugs 3. Their current medications and supplements 4. The patient's functional ability including ADL's, fall risks, home safety risks and hearing or visual             impairment. 5. Diet and physical activities 6. Evidence for depression or mood disorders  The patients weight, height, BMI have been recorded in the chart and visual acuity is per eye clinic.  I have made referrals, counseling and provided education to the patient based review of the above and I have provided the pt with a written personalized care plan for preventive services.  Provider list updated- see scanned forms.  Routine anticipatory guidance given to patient.  See health maintenance. The possibility exists that previously documented standard health maintenance information may have been brought forward from a previous encounter into this note.  If needed, that same information has been updated to reflect the current situation based on today's encounter.    Flu 2020 Shingles discussed with patient PNA up-to-date Tetanus 2010.  Discussed with patient. Colonoscopy 2020 Prostate cancer screening 2020 Advance directive-wife designated if patient were incapacitated Cognitive function addressed- see scanned forms- and if abnormal then additional documentation follows.   R forearm 2cm lesion, scabbed, he scraped it prev and it is healing in the meantime.  Routine cautions d/w pt. he will update me if it does not fully heal.  Anterior scalp lesion noted.  He  wanted this evaluated.  Has been irritated.  Not bleeding.  Hyperuricemia.  Labs d/w pt.  No recent flares.  No colchicine use recently.  Defer proph tx given lack of flares.   Discussed with patient.  Mood d/w pt.  His daughter had ovarian cancer and is coming home from the hospital today in the near future.  She has had a complicated course. His stepson needed psychiatric treatment and he has had a complicated course.  This is on top of all of the considerations related to Covid.  He is compliant with his medication and is frustrated with the situation (as would be expected) but his mood is still appropriate.  Discussed.  Still okay for outpatient follow-up.    We talked about sildenafil.  He was able to tolerate use with empty stomach w/o ADE.  Removed from allergy list. See avs. Routine cautions given to patient.  Elevated Cholesterol: Using medications without problems: yes Muscle aches: he has some joint aches that changes with the weather, not likely from statin.   Diet compliance:yes Exercise:yes  Hypertension:    Using medication without problems or lightheadedness: yes Chest pain with exertion:no Edema:no Short of breath:no Labs d/w pt.   Intentional weight loss noted.  PMH and SH reviewed  Meds, vitals, and allergies reviewed.   ROS: Per HPI.  Unless specifically indicated otherwise in HPI, the patient denies:  General: fever. Eyes: acute vision changes ENT: sore throat Cardiovascular: chest pain Respiratory: SOB GI: vomiting GU: dysuria Musculoskeletal: acute back pain Derm: acute rash Neuro: acute motor dysfunction Psych: worsening mood Endocrine: polydipsia Heme: bleeding Allergy: hayfever  GEN: nad, alert and oriented HEENT: ncat, he has a  small irritated SK on the scalp noted. NECK: supple w/o LA CV: rrr. PULM: ctab, no inc wob ABD: soft, +bs EXT: no edema SKIN: no acute rash, right forearm with healing small abrasion noted. He is get some  irritation on the left foot in the fourth webspace.  Unclear if this is chronic fungal changes or a small seborrheic keratosis.  No ulceration.  Health Maintenance  Topic Date Due  . TETANUS/TDAP  08/16/2019  . COLONOSCOPY  10/24/2021  . INFLUENZA VACCINE  Completed  . Hepatitis C Screening  Completed  . PNA vac Low Risk Adult  Completed

## 2019-09-22 DIAGNOSIS — N529 Male erectile dysfunction, unspecified: Secondary | ICD-10-CM | POA: Insufficient documentation

## 2019-09-22 DIAGNOSIS — L989 Disorder of the skin and subcutaneous tissue, unspecified: Secondary | ICD-10-CM | POA: Insufficient documentation

## 2019-09-22 NOTE — Assessment & Plan Note (Signed)
Labs d/w pt.  No recent flares.  No colchicine use recently.  Defer proph tx given lack of flares.   Discussed with patient.

## 2019-09-22 NOTE — Assessment & Plan Note (Signed)
Mood d/w pt.  His daughter had ovarian cancer and is coming home from the hospital today in the near future.  She has had a complicated course. His stepson needed psychiatric treatment and he has had a complicated course.  This is on top of all of the considerations related to Covid.  He is compliant with his medication and is frustrated with the situation (as would be expected) but his mood is still appropriate.  Discussed.  Still okay for outpatient follow-up.   Continue current medications as is.  He agrees.

## 2019-09-22 NOTE — Assessment & Plan Note (Signed)
We talked about sildenafil.  He was able to tolerate use with empty stomach w/o ADE.  Removed from allergy list. See avs. Routine cautions given to patient.  He will update me as needed.

## 2019-09-22 NOTE — Assessment & Plan Note (Signed)
Advance directive- wife designated if patient were incapacitated.  

## 2019-09-22 NOTE — Assessment & Plan Note (Signed)
Scalp lesion looks to be an irritated seborrheic keratosis.  Discussed options.  He consented for liquid nitrogen treatment.  Frozen x3 with liquid nitrogen without complication.  We cannot guarantee 100% resolution with this, and he understands that.  We can retreat in the future if needed.  Routine post cryotherapy instructions given to patient.  He will update me as needed.  The scraped lesion on his right forearm appears to be healing normally and he will update me as needed.  The lesion in the left fourth webspace on his foot could be a fungal issue with chronic irritation.  He can try topical antifungal and update me as needed.

## 2019-09-22 NOTE — Assessment & Plan Note (Signed)
No change in meds.  Continue work on diet and exercise.  Labs discussed with patient.  Intentional weight loss noted.

## 2019-09-22 NOTE — Assessment & Plan Note (Signed)
Flu 2020 Shingles discussed with patient PNA up-to-date Tetanus 2010.  Discussed with patient. Colonoscopy 2020 Prostate cancer screening 2020 Advance directive-wife designated if patient were incapacitated Cognitive function addressed- see scanned forms- and if abnormal then additional documentation follows.

## 2019-09-24 ENCOUNTER — Encounter: Payer: Self-pay | Admitting: Family Medicine

## 2019-11-19 ENCOUNTER — Encounter: Payer: Self-pay | Admitting: Family Medicine

## 2019-11-19 DIAGNOSIS — Z8546 Personal history of malignant neoplasm of prostate: Secondary | ICD-10-CM | POA: Diagnosis not present

## 2019-11-19 DIAGNOSIS — N5201 Erectile dysfunction due to arterial insufficiency: Secondary | ICD-10-CM | POA: Diagnosis not present

## 2020-01-13 DIAGNOSIS — H903 Sensorineural hearing loss, bilateral: Secondary | ICD-10-CM | POA: Diagnosis not present

## 2020-03-16 ENCOUNTER — Ambulatory Visit: Payer: PPO | Admitting: Dermatology

## 2020-03-16 ENCOUNTER — Other Ambulatory Visit: Payer: Self-pay

## 2020-03-16 DIAGNOSIS — L821 Other seborrheic keratosis: Secondary | ICD-10-CM | POA: Diagnosis not present

## 2020-03-16 DIAGNOSIS — D229 Melanocytic nevi, unspecified: Secondary | ICD-10-CM

## 2020-03-16 DIAGNOSIS — L578 Other skin changes due to chronic exposure to nonionizing radiation: Secondary | ICD-10-CM

## 2020-03-16 DIAGNOSIS — L720 Epidermal cyst: Secondary | ICD-10-CM

## 2020-03-16 DIAGNOSIS — L820 Inflamed seborrheic keratosis: Secondary | ICD-10-CM | POA: Diagnosis not present

## 2020-03-16 DIAGNOSIS — L918 Other hypertrophic disorders of the skin: Secondary | ICD-10-CM

## 2020-03-16 DIAGNOSIS — L814 Other melanin hyperpigmentation: Secondary | ICD-10-CM | POA: Diagnosis not present

## 2020-03-16 DIAGNOSIS — Z1283 Encounter for screening for malignant neoplasm of skin: Secondary | ICD-10-CM | POA: Diagnosis not present

## 2020-03-16 DIAGNOSIS — L570 Actinic keratosis: Secondary | ICD-10-CM

## 2020-03-16 DIAGNOSIS — D1800 Hemangioma unspecified site: Secondary | ICD-10-CM | POA: Diagnosis not present

## 2020-03-16 NOTE — Progress Notes (Addendum)
   Follow-Up Visit   Subjective  Ryan Blevins is a 69 y.o. male who presents for the following: Annual Exam (UBSE - patient has noticed a lesion under his left under arm that is irritated and he would like removed ). The patient presents for Upper Body Skin Exam (UBSE) for skin cancer screening and mole check.  The following portions of the chart were reviewed this encounter and updated as appropriate:  Tobacco  Allergies  Meds  Problems  Med Hx  Surg Hx  Fam Hx     Review of Systems:  No other skin or systemic complaints except as noted in HPI or Assessment and Plan.  Objective  Well appearing patient in no apparent distress; mood and affect are within normal limits.  All skin waist up examined.  Objective  Scrotum: Subcutaneous nodule.    Objective  Face (2): Erythematous thin papules/macules with gritty scale.   Objective  neck and face (18): Erythematous keratotic or waxy stuck-on papule or plaque.   Objective  Left Axilla (4): Fleshy, skin-colored pedunculated papules.    Assessment & Plan    Epidermal inclusion cyst Scrotum benign Discussed excision option vs observation.  AK (actinic keratosis) (2) Face  Destruction of lesion - Face Complexity: simple   Destruction method: cryotherapy   Informed consent: discussed and consent obtained   Timeout:  patient name, date of birth, surgical site, and procedure verified Lesion destroyed using liquid nitrogen: Yes   Region frozen until ice ball extended beyond lesion: Yes   Outcome: patient tolerated procedure well with no complications   Post-procedure details: wound care instructions given    Inflamed seborrheic keratosis (18) neck and face  Destruction of lesion - neck and face Complexity: simple   Destruction method: cryotherapy   Informed consent: discussed and consent obtained   Timeout:  patient name, date of birth, surgical site, and procedure verified Lesion destroyed using liquid  nitrogen: Yes   Region frozen until ice ball extended beyond lesion: Yes   Outcome: patient tolerated procedure well with no complications   Post-procedure details: wound care instructions given    Skin tag (4) Left Axilla  Inflamed and irritated - snip excision performed today x 4  Lentigines - Scattered tan macules - Discussed due to sun exposure - Benign, observe - Call for any changes  Seborrheic Keratoses - Stuck-on, waxy, tan-brown papules and plaques  - Discussed benign etiology and prognosis. - Observe - Call for any changes  Melanocytic Nevi - Tan-brown and/or pink-flesh-colored symmetric macules and papules - Benign appearing on exam today - Observation - Call clinic for new or changing moles - Recommend daily use of broad spectrum spf 30+ sunscreen to sun-exposed areas.   Hemangiomas - Red papules - Discussed benign nature - Observe - Call for any changes  Actinic Damage - diffuse scaly erythematous macules with underlying dyspigmentation - Recommend daily broad spectrum sunscreen SPF 30+ to sun-exposed areas, reapply every 2 hours as needed.  - Call for new or changing lesions.  Skin cancer screening performed today.   Return in about 6 months (around 09/15/2020) for UBSE.  Luther Redo, CMA, am acting as scribe for Sarina Ser, MD .  Documentation: I have reviewed the above documentation for accuracy and completeness, and I agree with the above.  Sarina Ser, MD

## 2020-03-19 ENCOUNTER — Encounter: Payer: Self-pay | Admitting: Dermatology

## 2020-03-24 NOTE — Addendum Note (Signed)
Addended by: Rudell Cobb A on: 03/24/2020 03:57 PM   Modules accepted: Orders

## 2020-04-14 ENCOUNTER — Other Ambulatory Visit: Payer: Self-pay

## 2020-04-26 ENCOUNTER — Other Ambulatory Visit: Payer: Self-pay | Admitting: Family Medicine

## 2020-04-26 DIAGNOSIS — J309 Allergic rhinitis, unspecified: Secondary | ICD-10-CM

## 2020-07-07 ENCOUNTER — Telehealth: Payer: Self-pay

## 2020-07-07 NOTE — Telephone Encounter (Signed)
Will discuss at upcoming appt.

## 2020-07-07 NOTE — Telephone Encounter (Signed)
[facility] - Client TELEPHONE ADVICE RECORD [facility] Patient Name: Ryan Blevins Gender: Male DOB: Feb 24, 1951 Age: 69 Y 5 D Return Phone Number: [phone] (Primary), [phone] (Secondary) Address[place] [address] Client [facility] - Client Client Site [place] - Day Physician Renford Dills - MD Contact Type Call Who Is Calling Patient / Member / Family / Caregiver Call Type Triage / Clinical Relationship To Patient Self Return Phone Number [phone] (Primary) Chief Complaint Abdominal Pain Reason for Call Symptomatic / Request for [place] states he is having lower right quandrant to back pain. It began as soreness and turned acute.He states that it feels swollen.He states that it may be his gallbladder Translation No Nurse Assessment Nurse: Harlow Mares, RN, Suanne Marker Date/Time (Eastern Time): 07/07/2020 10:06:13 AM Confirm and document reason for call. If symptomatic, describe symptoms. ---Caller states he is having upper right quandrant (under rib cage) to back pain. It began as soreness and turned acute. He states that it feels swollen. He states that it may be his gallbladder. Has discussed this with MD (removal); was not acute at the time. Discomfort began about 2 days ago. Acute pain comes and goes for the last month. Does the patient have any new or worsening symptoms? ---Yes Will a triage be completed? ---Yes Related visit to physician within the last 2 weeks? ---N/A Does the PT have any chronic conditions? (i.e. diabetes, asthma, this includes High risk factors for pregnancy, etc.) ---Unknown Is this a behavioral health or substance abuse call? ---No Guidelines Guideline Title Affirmed Question Affirmed Notes Nurse Date/Time Eilene Ghazi Time) Abdominal Pain - Upper [1] MODERATE pain (e.g., interferes with normal activities)  AND [2] comes and goes (cramps) AND [3] present > 24 hours (Exception: pain with Vomiting or Diarrhea - see that Guideline) Harlow Mares, RN, Rhonda 07/07/2020 10:10:52 AM PLEASE NOTE: All timestamps contained within this report are represented as [place] Standard Time. CONFIDENTIALTY NOTICE: This fax transmission is intended only for the addressee. It contains information that is legally privileged, confidential or otherwise protected from use or disclosure. If you are not the intended recipient, you are strictly prohibited from reviewing, disclosing, copying using or disseminating any of this information or taking any action in reliance on or regarding this information. If you have received this fax in error, please notify us immediately by telephone so that we can arrange for its return to [place]. Phone: [phone], Toll-Free: [phone], Fax: [phone] Page: 2 of 2 Call Id: [id number] [place]. Time Eilene Ghazi Time) Disposition Final User 07/07/2020 10:15:22 AM See PCP within 24 Hours Yes Harlow Mares, RN, Rosalyn Charters Disagree/Comply Comply Caller Understands Yes PreDisposition Call Doctor Care Advice Given Per Guideline SEE PCP WITHIN 24 HOURS: * IF OFFICE WILL BE OPEN: You need to be examined within the next 24 hours. Call your doctor (or NP/PA) when the office opens and make an appointment. AVOID ASPIRIN AND NSAIDS: * Avoid taking aspirin and antiinflammatory medicines (i.e., NSAIDS like ibuprofen/Motrin, naproxen/Aleve) unless you have been told to do so by a healthcare provider. * These drugs can irritate the stomach lining and make the pain worse. * Acetaminophen (e.g., Tylenol) does not cause stomach irritation. CALL BACK IF: * Severe pain present over 1 hour * Constant pain present over 2 hours * You become worse CARE ADVICE given per Abdominal Pain, Upper (Adult) guideline. Referrals REFERRED TO PCP OFFICE

## 2020-07-07 NOTE — Telephone Encounter (Signed)
Pt already has appt scheduled on 07/08/20 at 2:30 with R Baity NP.

## 2020-07-08 ENCOUNTER — Other Ambulatory Visit: Payer: Self-pay

## 2020-07-08 ENCOUNTER — Encounter: Payer: Self-pay | Admitting: Internal Medicine

## 2020-07-08 ENCOUNTER — Ambulatory Visit (INDEPENDENT_AMBULATORY_CARE_PROVIDER_SITE_OTHER): Payer: PPO | Admitting: Internal Medicine

## 2020-07-08 VITALS — BP 132/80 | HR 99 | Temp 97.7°F | Wt 203.0 lb

## 2020-07-08 DIAGNOSIS — R140 Abdominal distension (gaseous): Secondary | ICD-10-CM | POA: Diagnosis not present

## 2020-07-08 DIAGNOSIS — K219 Gastro-esophageal reflux disease without esophagitis: Secondary | ICD-10-CM

## 2020-07-08 DIAGNOSIS — R1011 Right upper quadrant pain: Secondary | ICD-10-CM | POA: Diagnosis not present

## 2020-07-08 LAB — LIPASE: Lipase: 42 U/L (ref 11.0–59.0)

## 2020-07-08 LAB — COMPREHENSIVE METABOLIC PANEL
ALT: 28 U/L (ref 0–53)
AST: 24 U/L (ref 0–37)
Albumin: 4.3 g/dL (ref 3.5–5.2)
Alkaline Phosphatase: 74 U/L (ref 39–117)
BUN: 16 mg/dL (ref 6–23)
CO2: 30 mEq/L (ref 19–32)
Calcium: 9.6 mg/dL (ref 8.4–10.5)
Chloride: 98 mEq/L (ref 96–112)
Creatinine, Ser: 1.22 mg/dL (ref 0.40–1.50)
GFR: 60.11 mL/min (ref >60.00)
Glucose, Bld: 91 mg/dL (ref 70–99)
Potassium: 3.9 mEq/L (ref 3.5–5.1)
Sodium: 135 mEq/L (ref 135–145)
Total Bilirubin: 0.5 mg/dL (ref 0.2–1.2)
Total Protein: 7 g/dL (ref 6.0–8.3)

## 2020-07-08 LAB — AMYLASE: Amylase: 62 U/L (ref 27–131)

## 2020-07-08 MED ORDER — OMEPRAZOLE 20 MG PO CPDR: 20.0000 mg | DELAYED_RELEASE_CAPSULE | Freq: Every day | ORAL | 2 refills | Status: DC

## 2020-07-08 NOTE — Progress Notes (Signed)
Subjective:    Patient ID: Ryan Blevins, male    DOB: Jul 20, 1951, 69 y.o.   MRN: [id number]  HPI  Pt presents to the clinic today with c/o abdominal pain. This started 1 month ago but worsened 2 days ago. He describes the pain as soreness and sharp at times. The pain radiates into his back. He reports some associated bloating and reflux but denies nausea, vomiting, diarrhea, constipation or blood in his stool. He reports this flares up when his diet is bad. It does not seem to be related to position changes or movement. He denies chest pain or SOB. He does have reflux nightly, treated with TUMS. He has tried Tylenol OTC with minimal relief. MRI from 2017 showed no evidence of gallstones.  Review of Systems      Past Medical History:  Diagnosis Date  . Actinic keratosis 04/18/2018   Left nasal ala. Hypertrophic  . Basal cell carcinoma 02/25/2010   Left med. pretibial. Superficial with focal infiltration.  . Basal cell carcinoma 04/29/2013   Right superior helix. Excised: 06/12/2013  . Depression   . Dysplastic nevus 08/19/2015   Left med medial calf. DN with severe atypia, lateral margin involved. Excised: 09/01/2015, margins free.  Marland Kitchen Hemorrhoid   . Hyperlipidemia   . Hypertension   . Hyperuricemia   . Low testosterone 2011  . Prostate cancer ([place]) 2011   per Dr. Risa Grill.  Seed implant per Dr. Valere Dross  . Skin cancer   . Syncope     Current Outpatient Medications  Medication Sig Dispense Refill  . colchicine 0.6 MG tablet Take 1 tablet (0.6 mg total) by mouth daily as needed. 30 tablet 1  . fluticasone (FLONASE) 50 MCG/ACT nasal spray SHAKE LIQUID AND USE 2 SPRAYS IN EACH NOSTRIL DAILY 16 g 6  . lisinopril (ZESTRIL) 30 MG tablet TAKE 1 TABLET BY MOUTH DAILY 90 tablet 3  . rosuvastatin (CRESTOR) 10 MG tablet 1 tab daily. 90 tablet 3  . sildenafil (REVATIO) 20 MG tablet Take 1-3 tablets (20-60 mg total) by mouth daily as needed. 50 tablet 12  . tamsulosin (FLOMAX) 0.4 MG  CAPS capsule Take 1 capsule (0.4 mg total) by mouth daily. 90 capsule 3  . venlafaxine XR (EFFEXOR-XR) 75 MG 24 hr capsule Take 2 capsules (150 mg total) by mouth daily. 180 capsule 3   No current facility-administered medications for this visit.    Allergies  Allergen Reactions  . Atorvastatin     REACTION: but tolerant of crestor- this is the only statin he tolerates  . Bupropion Hcl     REACTION: mood changes  . Codeine     Family History  Problem Relation Age of Onset  . Heart disease Mother        Multiple heart surgeries, valve repair  . Osteoporosis Mother        Vertebral fracture  . Stroke Mother   . Hypertension Father   . Hyperlipidemia Father   . Diabetes Father   . Parkinson's disease Father   . Colon cancer Neg Hx     Social History   Socioeconomic History  . Marital status: Married    Spouse name: Not on file  . Number of children: Not on file  . Years of education: Not on file  . Highest education level: Not on file  Occupational History  . 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 Smoking status: Never Smoker  .  Smokeless tobacco: Never Used  Vaping Use  . Vaping Use: Never used  Substance and Sexual Activity  . Alcohol use: Yes    Comment: Occasional  . Drug use: No  . Sexual activity: Not on file  Other Topics Concern  . Not on file  Social History Narrative   From [place].  [employer] grad, doctorate in Counseling, [place]. Worked in [employer].   [place]   Works in Audiological scientist 2012   Social Determinants of Radio broadcast assistant Strain:   . Difficulty of Paying Living Expenses: Not on file  Food Insecurity:   . Worried About Charity fundraiser in the Last Year: Not on file  . Ran Out of Food in the Last Year: Not on file  Transportation Needs:   . Lack of Transportation (Medical): Not on file  . Lack of Transportation (Non-Medical): Not on file  Physical Activity:   .  Days of Exercise per Week: Not on file  . Minutes of Exercise per Session: Not on file  Stress:   . Feeling of Stress : Not on file  Social Connections:   . Frequency of Communication with Friends and Family: Not on file  . Frequency of Social Gatherings with Friends and Family: Not on file  . Attends Religious Services: Not on file  . Active Member of Clubs or Organizations: Not on file  . Attends Archivist Meetings: Not on file  . Marital Status: Not on file  Intimate Partner Violence:   . Fear of Current or Ex-Partner: Not on file  . Emotionally Abused: Not on file  . Physically Abused: Not on file  . Sexually Abused: Not on file     Constitutional: Denies fever, malaise, fatigue, headache or abrupt weight changes.  HEENT: Denies eye pain, eye redness, ear pain, ringing in the ears, wax buildup, runny nose, nasal congestion, bloody nose, or sore throat. Respiratory: Denies difficulty breathing, shortness of breath, cough or sputum production.   Cardiovascular: Denies chest pain, chest tightness, palpitations or swelling in the hands or feet.  Gastrointestinal: Denies abdominal pain, bloating, constipation, diarrhea or blood in the stool.  GU: Denies urgency, frequency, pain with urination, burning sensation, blood in urine, odor or discharge. Musculoskeletal: Denies decrease in range of motion, difficulty with gait, muscle pain or joint pain and swelling.  Skin: Denies redness, rashes, lesions or ulcercations.  Neurological: Denies dizziness, difficulty with memory, difficulty with speech or problems with balance and coordination.  Psych: Denies anxiety, depression, SI/HI.  No other specific complaints in a complete review of systems (except as listed in HPI above).  Objective:   Physical Exam  BP 132/80   Pulse 99   Temp 97.7 F (36.5 C) (Temporal)   Wt 203 lb (92.1 kg)   SpO2 97%   BMI 27.53 kg/m   Wt Readings from Last 3 Encounters:  09/19/19 198 lb 3 oz  (89.9 kg)  02/15/19 210 lb (95.3 kg)  02/05/19 210 lb (95.3 kg)    General: Appears his stated age, well developed, well nourished in NAD. Skin: Warm, dry and intact. No rashes noted. Cardiovascular: Normal rate and rhythm. S1,S2 noted.  No murmur, rubs or gallops noted. No JVD or BLE edema. No carotid bruits noted. Pulmonary/Chest: Normal effort and positive vesicular breath sounds. No respiratory distress. No wheezes, rales or ronchi noted.  Abdomen: Soft and tender in the RUQ. Normal bowel sounds. No distention or masses noted. Liver, spleen and kidneys  non palpable. Negative Murphy's sign. Musculoskeletal: No difficulty with gait.  Neurological: Alert and oriented.    BMET    Component Value Date/Time   NA 138 09/10/2019 0818   K 5.1 09/10/2019 0818   CL 103 09/10/2019 0818   CO2 28 09/10/2019 0818   GLUCOSE 92 09/10/2019 0818   BUN 15 09/10/2019 0818   CREATININE 1.20 09/10/2019 0818   CALCIUM 10.0 09/10/2019 0818   GFRNONAA 60 (L) 02/05/2019 1908   GFRAA >60 02/05/2019 1908    Lipid Panel     Component Value Date/Time   CHOL 116 09/10/2019 0818   TRIG 132.0 09/10/2019 0818   HDL 35.20 (L) 09/10/2019 0818   CHOLHDL 3 09/10/2019 0818   VLDL 26.4 09/10/2019 0818   LDLCALC 55 09/10/2019 0818    CBC    Component Value Date/Time   WBC 6.8 02/05/2019 1908   RBC 5.24 02/05/2019 1908   HGB 15.8 02/05/2019 1908   HCT 47.9 02/05/2019 1908   PLT 291 02/05/2019 1908   MCV 91.4 02/05/2019 1908   MCH 30.2 02/05/2019 1908   MCHC 33.0 02/05/2019 1908   RDW 13.2 02/05/2019 1908   LYMPHSABS 1.6 07/26/2017 0720   MONOABS 0.5 07/26/2017 0720   EOSABS 0.2 07/26/2017 0720   BASOSABS 0.0 07/26/2017 0720    Hgb A1C No results found for: HGBA1C          Assessment & Plan:  RUQ Pain, Abdominal Bloating, GERD:  Will check CMET, amylase and lipase Consider RUQ ultrasound pending labs- [place] [facility] for Omeprazole 20 mg daily   Will follow up after labs, return precautions  discussed  Webb Silversmith, NP This visit occurred during the SARS-CoV-2 public health emergency.  Safety protocols were in place, including screening questions prior to the visit, additional usage of staff PPE, and extensive cleaning of exam room while observing appropriate contact time as indicated for disinfecting solutions.

## 2020-07-08 NOTE — Patient Instructions (Signed)

## 2020-09-06 ENCOUNTER — Other Ambulatory Visit: Payer: Self-pay | Admitting: Family Medicine

## 2020-09-06 DIAGNOSIS — E790 Hyperuricemia without signs of inflammatory arthritis and tophaceous disease: Secondary | ICD-10-CM

## 2020-09-06 DIAGNOSIS — Z125 Encounter for screening for malignant neoplasm of prostate: Secondary | ICD-10-CM

## 2020-09-06 DIAGNOSIS — E782 Mixed hyperlipidemia: Secondary | ICD-10-CM

## 2020-09-17 ENCOUNTER — Other Ambulatory Visit (INDEPENDENT_AMBULATORY_CARE_PROVIDER_SITE_OTHER): Payer: PPO

## 2020-09-17 ENCOUNTER — Other Ambulatory Visit: Payer: Self-pay

## 2020-09-17 DIAGNOSIS — E790 Hyperuricemia without signs of inflammatory arthritis and tophaceous disease: Secondary | ICD-10-CM | POA: Diagnosis not present

## 2020-09-17 DIAGNOSIS — E782 Mixed hyperlipidemia: Secondary | ICD-10-CM | POA: Diagnosis not present

## 2020-09-17 DIAGNOSIS — Z125 Encounter for screening for malignant neoplasm of prostate: Secondary | ICD-10-CM | POA: Diagnosis not present

## 2020-09-17 LAB — LIPID PANEL
Cholesterol: 139 mg/dL (ref 0–200)
HDL: 35.6 mg/dL — ABNORMAL LOW (ref >39.00)
LDL Cholesterol: 73 mg/dL (ref 0–99)
NonHDL: 102.95
Total CHOL/HDL Ratio: 4
Triglycerides: 149 mg/dL (ref 0.0–149.0)
VLDL: 29.8 mg/dL (ref 0.0–40.0)

## 2020-09-17 LAB — COMPREHENSIVE METABOLIC PANEL
ALT: 21 U/L (ref 0–53)
AST: 19 U/L (ref 0–37)
Albumin: 4.4 g/dL (ref 3.5–5.2)
Alkaline Phosphatase: 74 U/L (ref 39–117)
BUN: 17 mg/dL (ref 6–23)
CO2: 28 mEq/L (ref 19–32)
Calcium: 9.4 mg/dL (ref 8.4–10.5)
Chloride: 104 mEq/L (ref 96–112)
Creatinine, Ser: 1.08 mg/dL (ref 0.40–1.50)
GFR: 70.17 mL/min (ref >60.00)
Glucose, Bld: 111 mg/dL — ABNORMAL HIGH (ref 70–99)
Potassium: 4.6 mEq/L (ref 3.5–5.1)
Sodium: 139 mEq/L (ref 135–145)
Total Bilirubin: 0.5 mg/dL (ref 0.2–1.2)
Total Protein: 6.8 g/dL (ref 6.0–8.3)

## 2020-09-17 LAB — URIC ACID: Uric Acid, Serum: 8.1 mg/dL — ABNORMAL HIGH (ref 4.0–7.8)

## 2020-09-17 LAB — PSA, MEDICARE: PSA: 0 ng/ml — ABNORMAL LOW (ref 0.10–4.00)

## 2020-09-21 ENCOUNTER — Ambulatory Visit: Payer: PPO

## 2020-09-25 ENCOUNTER — Encounter: Payer: PPO | Admitting: Family Medicine

## 2020-10-09 ENCOUNTER — Encounter: Payer: PPO | Admitting: Family Medicine

## 2020-10-20 ENCOUNTER — Encounter: Payer: Self-pay | Admitting: Family Medicine

## 2020-10-20 ENCOUNTER — Other Ambulatory Visit: Payer: Self-pay

## 2020-10-20 ENCOUNTER — Ambulatory Visit (INDEPENDENT_AMBULATORY_CARE_PROVIDER_SITE_OTHER): Payer: PPO | Admitting: Family Medicine

## 2020-10-20 VITALS — BP 140/82 | HR 77 | Temp 97.1°F | Ht 72.0 in | Wt 206.0 lb

## 2020-10-20 DIAGNOSIS — E782 Mixed hyperlipidemia: Secondary | ICD-10-CM | POA: Diagnosis not present

## 2020-10-20 DIAGNOSIS — I10 Essential (primary) hypertension: Secondary | ICD-10-CM

## 2020-10-20 DIAGNOSIS — F339 Major depressive disorder, recurrent, unspecified: Secondary | ICD-10-CM | POA: Diagnosis not present

## 2020-10-20 DIAGNOSIS — Z0000 Encounter for general adult medical examination without abnormal findings: Secondary | ICD-10-CM | POA: Diagnosis not present

## 2020-10-20 DIAGNOSIS — Z7189 Other specified counseling: Secondary | ICD-10-CM

## 2020-10-20 MED ORDER — SILDENAFIL CITRATE 20 MG PO TABS: 20.0000 mg | ORAL_TABLET | Freq: Every day | ORAL | 12 refills | Status: DC | PRN

## 2020-10-20 MED ORDER — ROSUVASTATIN CALCIUM 10 MG PO TABS: ORAL_TABLET | ORAL | 3 refills | Status: DC

## 2020-10-20 MED ORDER — COLCHICINE 0.6 MG PO TABS: 0.6000 mg | ORAL_TABLET | Freq: Every day | ORAL | 1 refills | Status: DC | PRN

## 2020-10-20 MED ORDER — LISINOPRIL 30 MG PO TABS: ORAL_TABLET | ORAL | 3 refills | Status: DC

## 2020-10-20 MED ORDER — VENLAFAXINE HCL ER 75 MG PO CP24: 150.0000 mg | ORAL_CAPSULE | Freq: Every day | ORAL | 3 refills | Status: DC

## 2020-10-20 MED ORDER — TAMSULOSIN HCL 0.4 MG PO CAPS: 0.4000 mg | ORAL_CAPSULE | Freq: Every day | ORAL | 3 refills | Status: DC

## 2020-10-20 NOTE — Patient Instructions (Signed)
Don't change your meds for now.  Update me as needed.   I will be thinking about you and your family.  Take care.  Glad to see you.

## 2020-10-20 NOTE — Progress Notes (Signed)
This visit occurred during the SARS-CoV-2 public health emergency.  Safety protocols were in place, including screening questions prior to the visit, additional usage of staff PPE, and extensive cleaning of exam room while observing appropriate contact time as indicated for disinfecting solutions.  I have personally reviewed the Medicare Annual Wellness questionnaire and have noted 1. The patient's medical and social history 2. Their use of alcohol, tobacco or illicit drugs 3. Their current medications and supplements 4. The patient's functional ability including ADL's, fall risks, home safety risks and hearing or visual             impairment. 5. Diet and physical activities 6. Evidence for depression or mood disorders  The patients weight, height, BMI have been recorded in the chart and visual acuity is per eye clinic.  I have made referrals, counseling and provided education to the patient based review of the above and I have provided the pt with a written personalized care plan for preventive services.  Provider list updated- see scanned forms.  Routine anticipatory guidance given to patient.  See health maintenance. The possibility exists that previously documented standard health maintenance information may have been brought forward from a previous encounter into this note.  If needed, that same information has been updated to reflect the current situation based on today's encounter.    Flu 2021 Shingles up-to-date PNA up-to-date Tetanus 2010.  Discussed with patient. Covid vaccine 2021. Colonoscopy 2020. Prostate cancer screening 2021 Advance directive-wife designated if patient were incapacitated. Cognitive function addressed- see scanned forms- and if abnormal then additional documentation follows.   He avoids gout triggers.  No recent flares.    Mood- daughter with ovarian cancer.  Discussed.  Still on venlafaxine.  Medication helps.  No adverse effect.   Compliant.  Hypertension:    Using medication without problems or lightheadedness: yes Chest pain with exertion:no Edema:no Short of breath:no Labs d/w pt.    Elevated Cholesterol: Using medications without problems: yes Muscle aches: no Diet compliance: d/w pt.   Exercise: d/w pt.   Labs d/w pt.   Prev abd pain from 10/21 resolved.  I asked him to update me as needed.  He agreed.  PSA still zero.  Discussed with patient.  PMH and SH reviewed  Meds, vitals, and allergies reviewed.   ROS: Per HPI.  Unless specifically indicated otherwise in HPI, the patient denies:  General: fever. Eyes: acute vision changes ENT: sore throat Cardiovascular: chest pain Respiratory: SOB GI: vomiting GU: dysuria Musculoskeletal: acute back pain Derm: acute rash Neuro: acute motor dysfunction Psych: worsening mood Endocrine: polydipsia Heme: bleeding Allergy: hayfever  GEN: nad, alert and oriented HEENT: ncat NECK: supple w/o LA CV: rrr. PULM: ctab, no inc wob ABD: soft, +bs, not tender to palpation. EXT: no edema SKIN: Well-perfused

## 2020-10-21 ENCOUNTER — Telehealth: Payer: Self-pay

## 2020-10-21 ENCOUNTER — Other Ambulatory Visit: Payer: Self-pay | Admitting: Family Medicine

## 2020-10-21 NOTE — Telephone Encounter (Signed)
[facility] Scrape (Key: [id number]) Rx #: [phone] Sildenafil Citrate 20MG  tablets

## 2020-10-22 NOTE — Assessment & Plan Note (Signed)
Advance directive- wife designated if patient were incapacitated.  

## 2020-10-22 NOTE — Assessment & Plan Note (Signed)
Would continue Crestor.  He is working on diet and exercise.  He will update me as needed.  Labs discussed with patient.

## 2020-10-22 NOTE — Assessment & Plan Note (Signed)
History of, with significant stressors noted that his daughter has recurrent ovarian cancer.  Discussed.  Venlafaxine still helps.  No adverse effect on medication.  He will continue as is and update me as needed.

## 2020-10-22 NOTE — Assessment & Plan Note (Signed)
Continue lisinopril.  Blood pressure controlled.  He will update me as needed.  Labs discussed with patient.  He agrees with plan.

## 2020-10-22 NOTE — Assessment & Plan Note (Signed)
Flu 2021 Shingles up-to-date PNA up-to-date Tetanus 2010.  Discussed with patient. Covid vaccine 2021. Colonoscopy 2020. Prostate cancer screening 2021 Advance directive-wife designated if patient were incapacitated. Cognitive function addressed- see scanned forms- and if abnormal then additional documentation follows.

## 2020-10-26 NOTE — Telephone Encounter (Signed)
Your request has been denied PA Case: [id number], Status: Denied. Notification: Completed.  MyChart message sent to patient informing him of this information.

## 2020-10-30 ENCOUNTER — Ambulatory Visit: Payer: PRIVATE HEALTH INSURANCE

## 2020-10-30 DIAGNOSIS — M25562 Pain in left knee: Secondary | ICD-10-CM

## 2020-10-30 MED ORDER — PREGABALIN 75 MG PO CAPS
75 mg | ORAL_CAPSULE | Freq: Every evening | ORAL | 1 refills | Status: AC
Start: 2020-10-30 — End: ?

## 2020-10-30 MED ORDER — DICLOFENAC SODIUM 1 % EX GEL
4 g | Freq: Four times a day (QID) | TOPICAL | 4 refills | Status: AC
Start: 2020-10-30 — End: ?

## 2020-10-30 NOTE — Progress Notes
Date: 10/30/2020  Name: Daryl Graves   MRN#: [id number]   DOB: 10-02-1950   Age: 70 y.o.  _______________________________________________________________________________________________________________    Dr. Leane Call, DO    The patient was seen at the [facility] office.    REASON FOR VISIT: Left knee    INTERIM HISTORY: The patient is a 70 y.o. male that comes to our office for orthopaedic evaluation of his ongoing symptoms. 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 He affirms the date of injury occurred sometime in 2011. However, per the patient he has developed a large lateral effusion within the last last two months. Since that time, the patient says that the pain is dull to sharp and rates the severity as 6/10. The pain is located medially about the knee without radiation. The pain is aggravated by activity and improves with rest. The patient has difficulties sitting, standing, and walking for long periods of time. The pain is interfering with activities of daily living. The patient affirms associated clicking and popping of the knee and affirms instability with walking. Overall, his symptoms have worsened in severity.    Patient is status post bilateral total knee arthroplasties.     PRIOR TREATMENT HAS INCLUDED:   []   No previous treatment   [x]   Ice/Heat   [x]   NSAID   []   Physical therapy   []   Cortisone injection   []   Viscous-supplementation injections  [x]   Bracing  []   Pain meds     ROS:  A COVID-19 questionnaire was filled out by the patient prior to the visit that included questions about having had a positive COVID-19 diagnosis in the last 14 days, contact with anybody diagnosed with  COVID-19 in the last 14 days, fever, headaches, unexplained muscle pain, weakness, diarrhea, nausea, vomiting, abdominal pain, respiratory illness/cough, shortness of breath, loss of smell, loss of taste, rash, skin irritation, unexplained hemorrhage, and fatigue. The responses to those questions were negative. A temperature was taken and it was less than 100 F.      Past Medical History: No past medical history on file.    Past Surgical History: No past surgical history on file.    Medications:   Current Outpatient Medications   Medication Sig   ??? diclofenac Sodium 1% gel Apply 4 g topically four (4) times daily.   ??? pregabalin 75 mg capsule Take 1 capsule (75 mg total) by mouth at bedtime. Max Daily Amount: 75 mg     No current facility-administered medications for this visit.       Allergies: Not on File    Social history:   Social History     Tobacco Use   ??? Smoking status: Not on file   ??? Smokeless tobacco: Not on file   Substance Use Topics   ??? Alcohol use: Not on file   ??? Drug use: Not on file       Family history: No family history on file.    REVIEW OF SYSTEMS: The 14-point review of systems as documented today in the medical record is remarkable for the positive orthopedic problems discussed above and their relevance was considered with respect to Constitutional, ENT, Cardiovascular, GU, Skin, Neurologic, Endocrine, Hematologic, Psychiatric, Gastrointestinal, Respiratory, Eyes and Allergic/Immunologic systems.      PHYSICAL EXAM:   Constitutional: [name]  [name] is a 70 y.o. male in no acute distress.   There were no vitals filed for this visit. There is no height or weight on file to calculate BMI.  Psyc: The patient is alert and oriented x3 with a normal mood and affect.   HENT: AT/NC; hearing intact   Eyes: PERRLA. Extra-ocular movements are intact.   Skin: Normal temperature. There are no rashes, ulcerations, or lesions.   Lymphatic: No induration or enlargement.   Extremities: No swelling or calf tenderness. clubbing. Cyanosis.  Gait:  [x]  Antalgic []  Normal []  Trendelenburg   Assistive devices: none     KNEE EXAM  Bilateral knees: The patient is grossly neurologically intact from L2-S1. 2+ deep tendon reflexes of the patellofemoral and achilles tendons. 2+ posterior tibialis and dorsalis pedis pulses. No lymphedema.  Skin has no lesions. Compartments are soft.     Right Knee Exam:   Surgical scars are noted.  Erythema present:                  []  Yes [x]  No   Bruising or ecchymosis:          []  Yes [x]  No   Palpable masses present:      []  Yes [x]  No   [x]  No effusion present   []  Small Effusion present []  Moderate Effusion present      []  Large effusion  present  Palpation tenderness severity: []  None   []  Mild   []  Moderate    []  Severe  Palpable tenderness location:              []  N/A              []  Medial joint line              []  Lateral joint line               []  Posteromedial joint line              []  Posterolateral joint line              []  Posterior knee              []  Patellofemoral joint line              []  Diffusely               []  Quadriceps tendon              []  Patellar tendon              []  Pes anserine bursa              []  Patella              []  Diffusely        Range of motion is 0??? extension to 102??? flexion  Guarding and crepitus:           []  Yes [x]  No   Flexion contractures:              []  Yes [x]  No   The knee is stable with no laxity with Varus and Valgus stress at 0, 30, 90???.      Special tests:   Lachman's test:  []  Positive [x]  Negative []  Not Tested  Anterior drawer test:  []  Positive [x]  Negative []  Not Tested  Posterior drawer test:  []  Positive [x]  Negative []  Not Tested  Patellar apprehension test: []  Positive [x]   Negative []  Not Tested  Patellar grind test:  []  Positive [x]  Negative []  Not Tested  Muscle Strength of the knee is 5/5. Patellar tracking is normal    Left Knee Exam:   Surgical scars are noted.   Fluctuation gelatonous 7x5 cm mass, lateral joint line adjacent to fibula head. No fluid wave   Erythema present:                  []  Yes [x]  No   Bruising or ecchymosis:          []  Yes [x]  No   Palpable masses present:      []  Yes [x]  No   [x]  No effusion present   []  Small Effusion present []  Moderate Effusion present      []  Large effusion  present  Palpation tenderness severity: []  None   [x]  Mild   []  Moderate    []  Severe  Palpable tenderness location:              []  N/A              [x]  Medial joint line              []  Lateral joint line               []  Posteromedial joint line              []  Posterolateral joint line              []  Posterior knee              []  Patellofemoral joint line              []  Diffusely               []  Quadriceps tendon              []  Patellar tendon              []  Pes anserine bursa              []  Patella              []  Diffusely        Range of motion is 0??? extension to 94??? flexion  Guarding and crepitus:           []  Yes [x]  No   Flexion contractures:              []  Yes [x]  No   The knee is unstable with 1+ laxity with Varus stress at 0, 30, 90???.     Special tests:   Lachman's test:  []  Positive [x]  Negative []  Not Tested  Anterior drawer test:  []  Positive [x]  Negative []  Not Tested  Posterior drawer test:  []  Positive [x]  Negative []  Not Tested  Patellar apprehension test: []  Positive [x]  Negative []  Not Tested  Patellar grind test:  []  Positive [x]  Negative []  Not Tested  Muscle Strength of the knee is 5/5. Patellar tracking is normal    RADIOGRAPHS: 4 views of left knee  were ordered at [facility] on October 12, 2020, and 1 sunrise view performed at [facility] on October 30, 2020 and reviewed by me here at [facility] today and reviewed with the patient. Radiographs demonstrate a TKA in adequate position and alignment.  There is adequate implant cement-bone interface without radiolucencies. There is no evidence of loosening or other abnormalities. The patella is well located.  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 I discussed with the patient there physical exam findings, radiographs and imaging, above diagnoses, and current treatment options along with their risks and benefits.     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 Out of abundance of precaution we would like to request authorization for CRP and ESR to rule out any underlying infection given the presence of his mass on the aforementioned  knee. In addition, we will request authorization for an open MRI with MARS of the left knee to elucidate his pain pathology and to rule out any soft tissue damage/impingement and malignancy. He was given a prescription of Lyrica regarding his nerve pain. He may continue with his Voltaren gel for topical pain relief.     NEXT APPOINTMENT:  Follow up after the results of the lab work and after the results the open MRI with MARS with the left knee.     Thank you for allowing me to participate in Cortland Crehan Baccam???s care. Please do not hesitate to contact me if you have any questions or concerns.         Leane Call, D.O.  Adult Reconstruction Specialist  ORTHOPEDIC SURGERY  10/30/2020

## 2020-10-31 DIAGNOSIS — Z96652 Presence of left artificial knee joint: Secondary | ICD-10-CM

## 2020-10-31 DIAGNOSIS — Z96651 Presence of right artificial knee joint: Secondary | ICD-10-CM

## 2020-11-09 ENCOUNTER — Other Ambulatory Visit: Payer: Self-pay | Admitting: Family Medicine

## 2020-11-09 ENCOUNTER — Other Ambulatory Visit: Payer: Self-pay

## 2020-11-09 ENCOUNTER — Ambulatory Visit: Payer: PPO | Admitting: Dermatology

## 2020-11-09 DIAGNOSIS — Z1283 Encounter for screening for malignant neoplasm of skin: Secondary | ICD-10-CM | POA: Diagnosis not present

## 2020-11-09 DIAGNOSIS — L578 Other skin changes due to chronic exposure to nonionizing radiation: Secondary | ICD-10-CM

## 2020-11-09 DIAGNOSIS — D1800 Hemangioma unspecified site: Secondary | ICD-10-CM | POA: Diagnosis not present

## 2020-11-09 DIAGNOSIS — D229 Melanocytic nevi, unspecified: Secondary | ICD-10-CM | POA: Diagnosis not present

## 2020-11-09 DIAGNOSIS — Z85828 Personal history of other malignant neoplasm of skin: Secondary | ICD-10-CM

## 2020-11-09 DIAGNOSIS — L570 Actinic keratosis: Secondary | ICD-10-CM | POA: Diagnosis not present

## 2020-11-09 DIAGNOSIS — L814 Other melanin hyperpigmentation: Secondary | ICD-10-CM | POA: Diagnosis not present

## 2020-11-09 DIAGNOSIS — Z86018 Personal history of other benign neoplasm: Secondary | ICD-10-CM

## 2020-11-09 DIAGNOSIS — L820 Inflamed seborrheic keratosis: Secondary | ICD-10-CM | POA: Diagnosis not present

## 2020-11-09 DIAGNOSIS — L821 Other seborrheic keratosis: Secondary | ICD-10-CM

## 2020-11-09 DIAGNOSIS — D489 Neoplasm of uncertain behavior, unspecified: Secondary | ICD-10-CM

## 2020-11-09 DIAGNOSIS — C441191 Basal cell carcinoma of skin of left upper eyelid, including canthus: Secondary | ICD-10-CM

## 2020-11-09 NOTE — Progress Notes (Signed)
Follow-Up Visit   Subjective  Ryan Blevins is a 70 y.o. male who presents for the following: tbse (Patient is here today for TBSE. 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 He also has history of dysplastic nevus on left medial calf. Today he states he noticed a spot in left eye area  over 3 months ago.). The patient presents for Total-Body Skin Exam (TBSE) for skin cancer screening and mole check.  The following portions of the chart were reviewed this encounter and updated as appropriate:  Tobacco  Allergies  Meds  Problems  Med Hx  Surg Hx  Fam Hx      Objective  Well appearing patient in no apparent distress; mood and affect are within normal limits.  A full examination was performed including scalp, head, eyes, ears, nose, lips, neck, chest, axillae, abdomen, back, buttocks, bilateral upper extremities, bilateral lower extremities, hands, feet, fingers, toes, fingernails, and toenails. All findings within normal limits unless otherwise noted below.  Objective  Left medial upper eyelid:  0.7 cm crusted papule   Objective  face x 5 ,  right ear x 1 , hands x 10 (16): Erythematous thin papules/macules with gritty scale.   Objective  Right temple x 1: Erythematous keratotic or waxy stuck-on papule or plaque.    Assessment & Plan  Neoplasm of uncertain behavior Left medial upper eyelid Skin / nail biopsy Type of biopsy: tangential   Informed consent: discussed and consent obtained   Timeout: patient name, date of birth, surgical site, and procedure verified   Procedure prep:  Patient was prepped and draped in usual sterile fashion Prep type:  Isopropyl alcohol Anesthesia: the lesion was anesthetized in a standard fashion   Anesthetic:  1% lidocaine w/ epinephrine 1-100,000 buffered w/ 8.4% NaHCO3 Instrument used: flexible razor blade   Hemostasis achieved with: pressure, aluminum chloride and electrodesiccation   Outcome: patient  tolerated procedure well   Post-procedure details: sterile dressing applied and wound care instructions given   Dressing type: petrolatum and bandage    Specimen 1 - Surgical pathology Differential Diagnosis: r/o bcc   Check Margins: No 0.7 cm crusted papule  R.o bcc  If positive consider MOHs surgery   Actinic keratosis (16) face x 5 ,  right ear x 1 , hands x 10 Prior to procedure, discussed risks of blister formation, small wound, skin dyspigmentation, or rare scar following cryotherapy.   Destruction of lesion - face x 5 ,  right ear x 1 , hands x 10 Complexity: simple   Destruction method: cryotherapy   Informed consent: discussed and consent obtained   Timeout:  patient name, date of birth, surgical site, and procedure verified Lesion destroyed using liquid nitrogen: Yes   Region frozen until ice ball extended beyond lesion: Yes   Outcome: patient tolerated procedure well with no complications   Post-procedure details: wound care instructions given    Inflamed seborrheic keratosis Right temple x 1 Prior to procedure, discussed risks of blister formation, small wound, skin dyspigmentation, or rare scar following cryotherapy.   Destruction of lesion - Right temple x 1 Complexity: simple   Destruction method: cryotherapy   Informed consent: discussed and consent obtained   Timeout:  patient name, date of birth, surgical site, and procedure verified Lesion destroyed using liquid nitrogen: Yes   Region frozen until ice ball extended beyond lesion: Yes   Outcome: patient tolerated procedure well with no complications   Post-procedure details:  wound care instructions given    Lentigines - Scattered tan macules - Discussed due to sun exposure - Benign, observe - Call for any changes  Seborrheic Keratoses - Stuck-on, waxy, tan-brown papules and plaques  - Discussed benign etiology and prognosis. - Observe - Call for any changes  Melanocytic Nevi - Tan-brown and/or  pink-flesh-colored symmetric macules and papules - Benign appearing on exam today - Observation - Call clinic for new or changing moles - Recommend daily use of broad spectrum spf 30+ sunscreen to sun-exposed areas.   Hemangiomas - Red papules - Discussed benign nature - Observe - Call for any changes  Actinic Damage - Chronic, secondary to cumulative UV/sun exposure - diffuse scaly erythematous macules with underlying dyspigmentation - Recommend daily broad spectrum sunscreen SPF 30+ to sun-exposed areas, reapply every 2 hours as needed.  - Call for new or changing lesions.  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  - Call if any new or changing lesions are noted between office visits  History of Dysplastic Nevi Left medial calf  - No evidence of recurrence today - Recommend regular full body skin exams - Recommend daily broad spectrum sunscreen SPF 30+ to sun-exposed areas, reapply every 2 hours as needed.  - Call if any new or changing lesions are noted between office visits  Skin cancer screening performed today.  Return in about 6 months (around 05/09/2021) for tbse and follow up on ak.  IRuthell Rummage, CMA, am acting as scribe for Sarina Ser, MD.  Documentation: I have reviewed the above documentation for accuracy and completeness, and I agree with the above.  Sarina Ser, MD

## 2020-11-09 NOTE — Patient Instructions (Signed)
Cryotherapy Aftercare  . Wash gently with soap and water everyday.   Marland Kitchen Apply Vaseline and Band-Aid daily until healed.  Biopsy Wound Care Instructions  1. Leave the original bandage on for 24 hours if possible.  If the bandage becomes soaked or soiled before that time, it is OK to remove it and examine the wound.  A small amount of post-operative bleeding is normal.  If excessive bleeding occurs, remove the bandage, place gauze over the site and apply continuous pressure (no peeking) over the area for 30 minutes. If this does not work, please call our clinic as soon as possible or page your doctor if it is after hours.   2. Once a day, cleanse the wound with soap and water. It is fine to shower. If a thick crust develops you may use a Q-tip dipped into dilute hydrogen peroxide (mix 1:1 with water) to dissolve it.  Hydrogen peroxide can slow the healing process, so use it only as needed.    3. After washing, apply petroleum jelly (Vaseline) or an antibiotic ointment if your doctor prescribed one for you, followed by a bandage.    4. For best healing, the wound should be covered with a layer of ointment at all times. If you are not able to keep the area covered with a bandage to hold the ointment in place, this may mean re-applying the ointment several times a day.  Continue this wound care until the wound has healed and is no longer open.   Itching and mild discomfort is normal during the healing process. However, if you develop pain or severe itching, please call our office.   If you have any discomfort, you can take Tylenol (acetaminophen) or ibuprofen as directed on the bottle. (Please do not take these if you have an allergy to them or cannot take them for another reason).  Some redness, tenderness and white or yellow material in the wound is normal healing.  If the area becomes very sore and red, or develops a thick yellow-green material (pus), it may be infected; please notify us.    If you  have stitches, return to clinic as directed to have the stitches removed. You will continue wound care for 2-3 days after the stitches are removed.   Wound healing continues for up to one year following surgery. It is not unusual to experience pain in the scar from time to time during the interval.  If the pain becomes severe or the scar thickens, you should notify the office.    A slight amount of redness in a scar is expected for the first six months.  After six months, the redness will fade and the scar will soften and fade.  The color difference becomes less noticeable with time.  If there are any problems, return for a post-op surgery check at your earliest convenience.  To improve the appearance of the scar, you can use silicone scar gel, cream, or sheets (such as Mederma or Serica) every night for up to one year. These are available over the counter (without a prescription).  Please call our office at [phone] for any questions or concerns.

## 2020-11-10 ENCOUNTER — Encounter: Payer: Self-pay | Admitting: Dermatology

## 2020-11-11 ENCOUNTER — Other Ambulatory Visit: Payer: Self-pay

## 2020-11-11 ENCOUNTER — Ambulatory Visit: Payer: PPO

## 2020-11-19 NOTE — Progress Notes
Date: 11/27/2020  Name: Daryl Graves   MRN#: [id number]   DOB: [birth year]   Age: 70 y.o.  _______________________________________________________________________________________________________________    Dr. Leane Call, DO    The patient was seen at the [facility] office    CHIEF COMPLAINT: Left knee pain.    INTERIM HISTORY: Daryl Graves is a 70 y.o. male that comes to our office for orthopaedic follow up of his ongoing symptoms.     ROS:  A COVID-19 questionnaire was filled out by the patient prior to the visit that included questions about having had a positive COVID-19 diagnosis in the last 14 days, contact with anybody diagnosed with  COVID-19 in the last 14 days, fever, headaches, unexplained muscle pain, weakness, diarrhea, nausea, vomiting, abdominal pain, respiratory illness/cough, shortness of breath, loss of smell, loss of taste, rash, skin irritation, unexplained hemorrhage, and fatigue. The responses to those questions were negative. A temperature was taken and it was less than 100 F.      PHYSICAL EXAM:   Constitutional: Daryl Graves is a 70 y.o. male in no acute distress.   There were no vitals filed for this visit. There is no height or weight on file to calculate BMI.  Psyc: The patient is alert and oriented x3 with a normal mood and affect.   HENT: AT/NC; hearing intact   Eyes: PERRLA. Extra-ocular movements are intact.   Skin: Normal temperature. There are no rashes, ulcerations, or lesions.   Lymphatic: No induration or enlargement.   Extremities: No swelling or calf tenderness. No clubbing.No cyanosis.    Gait:  []  Antalgic [x]  Normal []  Trendelenburg   Assistive devices: {Assistive Devices DME:19535}    KNEE EXAM  Bilateral knees: The patient is grossly neurologically intact from L2-S1. 2+ deep tendon reflexes of the patellofemoral and achilles tendons. 2+ posterior tibialis and dorsalis pedis pulses. No lymphedema.  Skin has no lesions. Compartments are soft.     KNEE EXAM  Bilateral knees: The patient is grossly neurologically intact from L2-S1. 2+ deep tendon reflexes of the patellofemoral and achilles tendons. 2+ posterior tibialis and dorsalis pedis pulses. No lymphedema.  Skin has no lesions. Compartments are soft.   ???  Right Knee Exam:   Surgical scars are noted.  Erythema present:??????????????????????????????????????????????????????[] ?????Yes???[x] ?????No   Bruising or ecchymosis:???????????????????????? ???[] ?????Yes???[x] ?????No   Palpable masses???present:??????????????????[] ?????Yes???[x] ?????No   [x] ?????No effusion present???  [] ?????Small Effusion present [] ?????Moderate Effusion present ???????????????[] ?????Large effusion ???present  Palpation tenderness severity: [] ?????None ??????[] ?????Mild ??????[] ?????Moderate ?????????[] ?????Severe  Palpable tenderness location:  ????????????????????????????????????[] ?????N/A  ????????????????????????????????????[] ?????Medial joint line  ????????????????????????????????????[] ?????Lateral joint line   ????????????????????????????????????[] ?????Posteromedial???joint line  ????????????????????????????????????[] ?????Posterolateral joint line  ????????????????????????????????????[] ?????Posterior knee  ????????????????????????????????????[] ?????Patellofemoral joint line  ????????????????????????????????????[] ?????Diffusely   ????????????????????????????????????[] ?????Quadriceps tendon  ????????????????????????????????????[] ?????Patellar tendon  ????????????????????????????????????[] ?????Pes anserine bursa  ????????????????????????????????????[] ?????Patella  ????????????????????????????????????[] ?????Diffusely ??????  ???  Range of motion is 0??? extension to 102??? flexion  Guarding and crepitus:?????????????????????????????????[] ?????Yes???[x] ?????No   Flexion contractures:??????????????????????????????????????????[] ?????Yes???[x] ?????No   The knee is stable with no laxity with Varus and Valgus stress at 0, 30, 90???.   ???  Special tests:   Lachman's test:                       [] ? Positive      [x] ? Negative     [] ? Not Tested  Anterior drawer test:               [] ? Positive      [x] ? Negative     [] ? Not Tested  Posterior drawer test:              [] ?  Positive      [x] ? Negative     [] ? Not Tested  Patellar apprehension test:     [] ? Positive      [x] ? Negative     [] ? Not Tested  Patellar grind test:                   [] ? Positive      [x] ? Negative     [] ? Not Tested  Muscle Strength of the knee is 5/5. Patellar tracking is normal  ???  Left Knee Exam: Surgical scars are noted.   Fluctuation gelatonous 7x5 cm mass, lateral joint line adjacent to fibula head. No fluid wave   Erythema present:??????????????????????????????????????????????????????[] ?????Yes???[x] ?????No   Bruising or ecchymosis:???????????????????????? ???[] ?????Yes???[x] ?????No   Palpable masses???present:??????????????????[] ?????Yes???[x] ?????No   [x] ?????No effusion present???  [] ?????Small Effusion present [] ?????Moderate Effusion present ???????????????[] ?????Large effusion ???present  Palpation tenderness severity: [] ?????None ??????[x] ?????Mild ??????[] ?????Moderate ?????????[] ?????Severe  Palpable tenderness location:  ????????????????????????????????????[] ?????N/A  ????????????????????????????????????[x] ?????Medial joint line  ????????????????????????????????????[] ?????Lateral joint line   ????????????????????????????????????[] ?????Posteromedial???joint line  ????????????????????????????????????[] ?????Posterolateral joint line  ????????????????????????????????????[] ?????Posterior knee  ????????????????????????????????????[] ?????Patellofemoral joint line  ????????????????????????????????????[] ?????Diffusely   ????????????????????????????????????[] ?????Quadriceps tendon  ????????????????????????????????????[] ?????Patellar tendon  ????????????????????????????????????[] ?????Pes anserine bursa  ????????????????????????????????????[] ?????Patella  ????????????????????????????????????[] ?????Diffusely ??????  ???  Range of motion is 0??? extension to 94??? flexion  Guarding and crepitus:?????????????????????????????????[] ?????Yes???[x] ?????No   Flexion contractures:??????????????????????????????????????????[] ?????Yes???[x] ?????No   The knee is unstable with 1+ laxity with Varus stress at 0, 30, 90???.   ???  Special tests:   Lachman's test:                       [] ? Positive      [x] ? Negative     [] ? Not Tested  Anterior drawer test:               [] ? Positive      [x] ? Negative     [] ? Not Tested  Posterior drawer test:              [] ? Positive      [x] ? Negative     [] ? Not Tested  Patellar apprehension test:     [] ? Positive      [x] ? Negative     [] ? Not Tested  Patellar grind test:                   [] ? Positive      [x] ? Negative     [] ? Not Tested  Muscle Strength of the knee is 5/5. Patellar tracking is normal  ???  RADIOGRAPHS: Not obtained today. Previous radiographs were reviewed in office.   ???  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 We discussed the treatment options including anti-inflammatory medications, injections, physical therapy, bracing, and surgical interventions.        NEXT APPOINTMENT: 3 months    Thank you for allowing me to participate in Zahir Eisenhour Manus???s care. Please do not hesitate to contact me if you have any questions or concerns.              Leane Call, D.O.  Adult Reconstruction Specialist  ORTHOPEDIC SURGERY  11/19/2020      CC: Susa Simmonds, MD

## 2020-11-23 ENCOUNTER — Other Ambulatory Visit: Payer: Self-pay

## 2020-11-23 ENCOUNTER — Telehealth: Payer: Self-pay

## 2020-11-23 DIAGNOSIS — C44319 Basal cell carcinoma of skin of other parts of face: Secondary | ICD-10-CM

## 2020-11-23 NOTE — Telephone Encounter (Signed)
-----   Message from Ralene Bathe, MD sent at 11/10/2020  6:43 PM EST ----- Diagnosis Skin , left medial upper eyelid BASAL CELL CARCINOMA, NODULAR PATTERN  Cancer - BCC of eyelid As suspected Schedule for MOHS

## 2020-11-23 NOTE — Telephone Encounter (Signed)
Pt walked in the office to have for biopsy results, discussed biopsy results with pt, pt request Mohs surgery at skin surgery in [facility]   Faxed referral to skin surgery in [place]

## 2020-11-24 DIAGNOSIS — C441191 Basal cell carcinoma of skin of left upper eyelid, including canthus: Secondary | ICD-10-CM | POA: Diagnosis not present

## 2020-11-27 ENCOUNTER — Ambulatory Visit: Payer: PRIVATE HEALTH INSURANCE

## 2020-11-27 DIAGNOSIS — M25862 Other specified joint disorders, left knee: Secondary | ICD-10-CM

## 2020-11-27 NOTE — Progress Notes
Date: 11/27/2020  Name: Daryl Graves   MRN#: [id number]   DOB: [birth year]   Age: 70 y.o.  _______________________________________________________________________________________________________________    Dr. Leane Call, DO    The patient was seen at the [facility] office.    REASON FOR VISIT: Left knee pain    INTERIM HISTORY: The patient is a 70 y.o. male that returns to our office for orthopaedic evaluation of his ongoing symptoms. Patient states he was working a [employer] and states he was running away from loose pipe, when he states his foot was caught on a root in the ground causing him to fall and aggravate his pain symptoms. He affirms the date of injury occurred sometime in 2011. However, per the patient he has developed a large lateral effusion within the last last two and half months. The patient states that the pain is worsened with prolonged walking, standing, kneeling, and going up/down steps. The patient recently underwent an MRI study of the left knee, which will be reviewed in clinic today. The patient is accompanied by his wife on today's visit. He notes 100% improvement with his Lyrica.     ROS:  A COVID-19 questionnaire was filled out by the patient prior to the visit that included questions about having had a positive COVID-19 diagnosis in the last 14 days, contact with anybody diagnosed with  COVID-19 in the last 14 days, fever, headaches, unexplained muscle pain, weakness, diarrhea, nausea, vomiting, abdominal pain, respiratory illness/cough, shortness of breath, loss of smell, loss of taste, rash, skin irritation, unexplained hemorrhage, and fatigue. The responses to those questions were negative. A temperature was taken and it was less than 100 F.      PHYSICAL EXAM:   Constitutional: Daryl Graves is a 70 y.o. male in no acute distress.   There were no vitals filed for this visit. There is no height or weight on file to calculate BMI.  Psyc: The patient is alert and oriented x3 with a normal mood and affect.   HENT: AT/NC; hearing intact   Eyes: PERRLA. Extra-ocular movements are intact.   Skin: Normal temperature. There are no rashes, ulcerations, or lesions.   Lymphatic: No induration or enlargement.   Extremities: No swelling or calf tenderness. clubbing. Cyanosis.  Gait:  [x]  Antalgic []  Normal []  Trendelenburg   Assistive devices: none     KNEE EXAM  Bilateral knees: The patient is grossly neurologically intact from L2-S1. 2+ deep tendon reflexes of the patellofemoral and achilles tendons. 2+ posterior tibialis and dorsalis pedis pulses. No lymphedema.  Skin has no lesions. Compartments are soft.     Right Knee Exam:   Surgical scars are noted.  Erythema present:                  []  Yes [x]  No   Bruising or ecchymosis:          []  Yes [x]  No   Palpable masses present:      []  Yes [x]  No   [x]  No effusion present   []  Small Effusion present []  Moderate Effusion present      []  Large effusion  present  Palpation tenderness severity: []  None   []  Mild   []  Moderate    []  Severe  Palpable tenderness location:              []  N/A              []  Medial joint line              []   Lateral joint line               []  Posteromedial joint line              []  Posterolateral joint line              []  Posterior knee              []  Patellofemoral joint line              []  Diffusely               []  Quadriceps tendon              []  Patellar tendon              []  Pes anserine bursa              []  Patella              []  Diffusely        Range of motion is 0??? extension to 102??? flexion  Guarding and crepitus:           []  Yes [x]  No   Flexion contractures:              []  Yes [x]  No   The knee is stable with no laxity with Varus and Valgus stress at 0, 30, 90???.      Special tests:   Lachman's test:  []  Positive [x]  Negative []  Not Tested  Anterior drawer test:  []  Positive [x]  Negative []  Not Tested  Posterior drawer test:  []  Positive [x]  Negative []  Not Tested  Patellar apprehension test: []  Positive [x]  Negative []  Not Tested  Patellar grind test:  []  Positive [x]  Negative []  Not Tested  Muscle Strength of the knee is 5/5. Patellar tracking is normal    Left Knee Exam:   Surgical scars are noted.   Fluctuation gelatonous 7x5 cm mass, lateral joint line adjacent to fibula head. No fluid wave   Erythema present:                  []  Yes [x]  No   Bruising or ecchymosis:          []  Yes [x]  No   Palpable masses present:      []  Yes [x]  No   [x]  No effusion present   []  Small Effusion present []  Moderate Effusion present      []  Large effusion  present  Palpation tenderness severity: []  None   [x]  Mild   []  Moderate    []  Severe  Palpable tenderness location:              []  N/A              [x]  Medial joint line              []  Lateral joint line               []  Posteromedial joint line              []  Posterolateral joint line              []  Posterior knee              []  Patellofemoral joint line              []  Diffusely               []  Quadriceps  tendon              []  Patellar tendon              []  Pes anserine bursa              []  Patella              []  Diffusely        Range of motion is 0??? extension to 94??? flexion  Guarding and crepitus:           []  Yes [x]  No   Flexion contractures:              []  Yes [x]  No   The knee is unstable with 1+ laxity with Varus stress at 0, 30, 90???.     Special tests:   Lachman's test:  []  Positive [x]  Negative []  Not Tested  Anterior drawer test:  []  Positive [x]  Negative []  Not Tested  Posterior drawer test:  []  Positive [x]  Negative []  Not Tested  Patellar apprehension test: []  Positive [x]  Negative []  Not Tested  Patellar grind test:  []  Positive [x]  Negative []  Not Tested  Muscle Strength of the knee is 5/5. Patellar tracking is normal    RADIOGRAPHS: Not obtained today. Previous radiographs were reviewed in office.     MRI: MRI of the left knee without contrast performed at [facility] on November 20, 2020 was reviewed in clinic today. Study shows:  1. Bone marrow signal is not well evaluated due to magnetic field distortion artifact. However, there is partly visualized large intraosseous cyst formation in the distal femur, with adjacent mild marrow edema, raising suspicion for granulomatosis/particle disease. Non-contrast CT of the left knee is recommended for further evaluation.    2. Subcutaneous 4.7 x 3.6 x 2.7 cm fluid collection in the lateral subcutaneous tissue, abutting the lateral cortex of the proximal tibia, demonstrating a thin communication with an additional 2.5 x 2.3 x 1.7 cm fluid collection abutting the posterior cortex of the lateral tibial plateau. Given the adjacent arthroplasty, this raises suspicion for granulomatosis/particle disease.    3. Small effusion within the knee joint, with synovitis.    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 I discussed with the patient there physical exam findings, radiographs and imaging, above diagnoses, and current treatment options along with their risks and benefits.     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 MRI findings are also consistent with this.  Moreover, out of abundance of precaution we would like to aspirate the knee via Synovasure to rule out any  infection vs progression of his polyethylene wear of his prothesis . This aspiration injection is both diagnostic and therapeutic. The risks associated with the injection were discussed with the patient and they have elected to proceed.    Regarding his left total knee prothesis, he was advised to obtain his operative report from Dr. Shannan Harper for possible surgical revision.     Large Joint/Bursa Drain/Inject: L knee    Date/Time: 11/27/2020 7:50 AM  Performed by: Leane Call, DO  Authorized by: Leane Call, DO  Consent Given by:  Patient  Site marked: the procedure site was marked    Timeout: prior to procedure the correct patient, procedure, and site was verified    Indications:  Pain, diagnostic evaluation and joint swelling  Location:  Knee  Site:  L knee  Approach:  Superolateral  Needle Size:  18 G  Medications:  80 mg triamcinolone acetonide 40 mg/mL; 4 mL bupivacaine PF 0.5%; 4 mL lidocaine PF 2%  Lab: fluid sent for laboratory analysis    Patient tolerance:  Patient tolerated the procedure well with no immediate complications   The risks and benefits of intra-articular joint aspiration and injection were carefully explained to the patient, informed consent was obtained.  Risks known to include:   1. Infection; 2. Vasovagal reaction; 3. Bleeding and bruising of the aspiration site from damage to arteries or veins; 4. Numbness and tingling related to damage to nerves.     Additionally the risks and benefits of a corticosteroid injection were carefully explained to the patient.   Risks are known to include:  1. Permanent thickening of the skin due to fat atrophy; 2. Permanent loss of skin pigmentation; 3. Temporary irritation of the injection site; for period increased pain following injection; 5. Vasovagal reaction; 6. Infection; 7. Allergic reaction and 8. Bruising of the injection site.  After explaining the risks and benefits of the patient consented to proceed with the injection.    Procedure:  Under strict sterile conditions the superior lateral aspect of the knee was meticulously prepped.  The skin was anesthetized with ethyl chloride followed by Betadine and alcohol.    The superolateral joint line was then aspirated.     The needle was then removed and the area was cleaned and dressed with a sterile dressing.  An Ace wrap was then applied to the knee to prevent reaccumulation of swelling.  Patient was instructed to use the Ace wrap intermittently throughout the proceeding days as well as ice, and elevation. The patient tolerated the procedure well, was able to ambulate without difficulty.        NEXT APPOINTMENT:  Follow up after the results of the lab work regarding his Alvina Chou results     Thank you for allowing me to participate in Hiren Peplinski [place]???s care. Please do not hesitate to contact me if you have any questions or concerns.         Leane Call, D.O.  Adult Reconstruction Specialist  ORTHOPEDIC SURGERY  11/27/2020

## 2020-11-28 MED ADMIN — BUPIVACAINE HCL (PF) 0.5 % IJ SOLN: INTRA_ARTICULAR | Stop: 2020-11-27 | NDC 55150016910

## 2020-11-28 MED ADMIN — TRIAMCINOLONE ACETONIDE 40 MG/ML IJ SUSP: INTRA_ARTICULAR | Stop: 2020-11-27 | NDC 00003029320

## 2020-11-28 MED ADMIN — LIDOCAINE HCL (PF) 2 % IJ SOLN: INTRA_ARTICULAR | Stop: 2020-11-27 | NDC 63323049507

## 2020-12-01 ENCOUNTER — Ambulatory Visit: Payer: PRIVATE HEALTH INSURANCE

## 2020-12-01 DIAGNOSIS — M25862 Other specified joint disorders, left knee: Secondary | ICD-10-CM

## 2020-12-01 NOTE — Progress Notes
Date: 12/01/2020  Name: Daryl Graves   MRN#: [id number]   DOB: 05-22-1951   Age: 70 y.o.  _______________________________________________________________________________________________________________    Dr. Leane Call, DO    The patient was seen at the [facility] office    CHIEF COMPLAINT: Left knee pain.    INTERIM HISTORY: Daryl Graves is a 70 y.o. male that comes to our office for orthopaedic follow up of his ongoing symptoms. Since the patient's last office visit the patient states his symptoms have somewhat improved. He is states he continues to have drainage from his aspiration injection site performed on November 27, 2020 as he states it will drain in the shower. He continues to have pain secondary to his large mass about the lateral joint line and is requesting surgical intervention to have this removed. The patient recently underwent  labwork, which will be reviewed in clinic today. The patient is accompanied by his wife on today's visit.    ROS:  A COVID-19 questionnaire was filled out by the patient prior to the visit that included questions about having had a positive COVID-19 diagnosis in the last 14 days, contact with anybody diagnosed with  COVID-19 in the last 14 days, fever, headaches, unexplained muscle pain, weakness, diarrhea, nausea, vomiting, abdominal pain, respiratory illness/cough, shortness of breath, loss of smell, loss of taste, rash, skin irritation, unexplained hemorrhage, and fatigue. The responses to those questions were negative. A temperature was taken and it was less than 100 F.      PHYSICAL EXAM:   Constitutional: Daryl Graves is a 70 y.o. male in no acute distress.   There were no vitals filed for this visit. There is no height or weight on file to calculate BMI.  Psyc: The patient is alert and oriented x3 with a normal mood and affect.   HENT: AT/NC; hearing intact   Eyes: PERRLA. Extra-ocular movements are intact.   Skin: Normal temperature. There are no rashes, ulcerations, or lesions.   Lymphatic: No induration or enlargement.   Extremities: No swelling or calf tenderness. No clubbing.No cyanosis.    Gait:  []  Antalgic [x]  Normal []  Trendelenburg   Assistive devices: none    KNEE EXAM  Bilateral knees: The patient is grossly neurologically intact from L2-S1. 2+ deep tendon reflexes of the patellofemoral and achilles tendons. 2+ posterior tibialis and dorsalis pedis pulses. No lymphedema.  Skin has no lesions. Compartments are soft.     KNEE EXAM  Bilateral knees: The patient is grossly neurologically intact from L2-S1. 2+ deep tendon reflexes of the patellofemoral and achilles tendons. 2+ posterior tibialis and dorsalis pedis pulses. No lymphedema.  Skin has no lesions. Compartments are soft.   ???    Left Knee Exam:   Surgical scars are noted.   Fluctuation gelatonous 7x5 cm mass, lateral joint line adjacent to fibula head. No fluid wave   Erythema present:??????????????????????????????????????????????????????[] ?????Yes???[x] ?????No   Bruising or ecchymosis:???????????????????????? ???[] ?????Yes???[x] ?????No   Palpable masses???present:??????????????????[] ?????Yes???[x] ?????No   [x] ?????No effusion present???  [] ?????Small Effusion present [] ?????Moderate Effusion present ???????????????[] ?????Large effusion ???present  Palpation tenderness severity: [] ?????None ??????[x] ?????Mild ??????[] ?????Moderate ?????????[] ?????Severe  Palpable tenderness location:  ????????????????????????????????????[] ?????N/A  ????????????????????????????????????[x] ?????Medial joint line  ????????????????????????????????????[] ?????Lateral joint line   ????????????????????????????????????[] ?????Posteromedial???joint line  ????????????????????????????????????[] ?????Posterolateral joint line  ????????????????????????????????????[] ?????Posterior knee  ????????????????????????????????????[] ?????Patellofemoral joint line  ????????????????????????????????????[] ?????Diffusely   ????????????????????????????????????[] ?????Quadriceps tendon  ????????????????????????????????????[] ?????Patellar tendon  ????????????????????????????????????[] ?????Pes anserine bursa  ????????????????????????????????????[] ?????Patella  ????????????????????????????????????[] ?????Diffusely ??????  ???  Range of motion is 0??? extension to 94??? flexion  Guarding and crepitus:?????????????????????????????????[] ?????Yes???[x] ?????No   Flexion contractures:??????????????????????????????????????????[] ?????Yes???[x] ?????No   The knee is  unstable with 1+ laxity with Varus stress at 0, 30, 90???.   ???  Special tests:   Lachman's test:                       [] ? Positive      [x] ? Negative     [] ? Not Tested  Anterior drawer test:               [] ? Positive      [x] ? Negative     [] ? Not Tested  Posterior drawer test:              [] ? Positive      [x] ? Negative     [] ? Not Tested  Patellar apprehension test:     [] ? Positive      [x] ? Negative     [] ? Not Tested  Patellar grind test:                   [] ? Positive      [x] ? Negative     [] ? Not Tested  Muscle Strength of the knee is 5/5. Patellar tracking is normal  ???  RADIOGRAPHS: Not obtained today. Previous radiographs were reviewed in office.     LABS: [facility]work performed at [facility] on November 27, 2020. Study shows:  Moderate Calcium Pyrophosphate Dihydrate  Unable to perform WBC count  ???  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 I discussed with the patient there physical exam findings, radiographs and imaging, above diagnoses, and current treatment options along with their risks and benefits.       His subjective complaints and objective findings are consistent with fluctuation gelatonous 7x5 cm mass on the lateral aspect of the left knee. MRI findings are also consistent with this. Laboratory results indicate moderate moderate calcium pyrophosphate crystal. Sterile technique was used to express/drain his popliteal cyst on today's visit. An Ace bandage was applied for comfort and compression.      We discussed all conservative and surgical treatment options today. They no longer wish to proceed with any conservative treatment. They would like to proceed with a left knee mass removal as quickly as possible. We will start the process of insurance verification and preoperative clearance. We will also give them a letter to send to their primary treating physician. We will schedule them at their earliest convenience.    Regarding his left total knee prothesis, he was advised to obtain his operative report from Dr. Shannan Harper for possible surgical revision     NEXT APPOINTMENT: Follow up at the preoperative visit for a left knee mass removal as an inpatient.     Thank you for allowing me to participate in Daryl Graves???s care. Please do not hesitate to contact me if you have any questions or concerns.              Leane Call, D.O.  Adult Reconstruction Specialist  ORTHOPEDIC SURGERY  12/01/2020      CC: Susa Simmonds, MD

## 2020-12-15 ENCOUNTER — Ambulatory Visit: Payer: PRIVATE HEALTH INSURANCE | Attending: Medical

## 2020-12-15 DIAGNOSIS — Z01818 Encounter for other preprocedural examination: Secondary | ICD-10-CM

## 2020-12-15 DIAGNOSIS — M25562 Pain in left knee: Secondary | ICD-10-CM

## 2020-12-15 DIAGNOSIS — M25862 Other specified joint disorders, left knee: Secondary | ICD-10-CM

## 2020-12-15 NOTE — Progress Notes
Date: 12/15/2020  Name: Daryl Graves   MRN#: [id number]   DOB: [birth year]   Age: 70 y.o.    Dr. Leane Call, DO  Thurmon Fair, PA-C     The patient was seen at the [facility] [place] office by Thurmon Fair, PA-C.    Reason for Visit: Left knee pain.    History: Daryl Graves is a 70 y.o. male who returns to our office for follow-up and to discuss him upcoming left knee mass removal surgery.  The patient continues to complain of significant pain and mechanical symptomatology in the knee.  There has been no improvement with conservative treatment. The patient is now requesting surgical intervention.    The patient???s intake sheet, updated today, including past medical history, past surgical history, medicines, allergies, social and family history have been reviewed by me with the patient and signed by both the patient and myself in the room.    PATIENT'S KNEE REPLACEMENT GOAL:  After having recovered from knee replacement surgery, their goal is to have improvement of pain and walk without a limp.     Past Medical History:   Past Medical History:   Diagnosis Date   ??? Arthritis    ??? Coccidioidomycosis    ??? Diabetes mellitus (HCC/RAF)    ??? Peripheral vascular disease (HCC/RAF)    ??? Sleep apnea        Past Surgical History:   Bilateral knee replacement  Shoulder surgeries  Bilateral thumb surgeries     Medications:   Current Outpatient Medications   Medication Sig   ??? atorvastatin 10 mg tablet TAKE 1 TABLET BY MOUTH AT BEDTIME   ??? azaTHIOprine 50 mg tablet TAKE TWO TABLETS BY MOUTH EVERY DAY   ??? diclofenac Sodium 1% gel Apply 4 g topically four (4) times daily.   ??? FARXIGA 5 MG tablet TAKE 1 TABLET BY MOUTH DAILY   ??? finasteride 5 mg tablet TAKE 1 TABLET BY MOUTH EVERY DAY   ??? meloxicam 15 mg tablet TAKE 1 TABLET BY MOUTH DAILY   ??? mesalamine (LIALDA) 1.2 g DR tablet TAKE 4 TABLETS BY MOUTH DAILY   ??? pregabalin 75 mg capsule Take 1 capsule (75 mg total) by mouth at bedtime. Max Daily Amount: 75 mg   ??? RYBELSUS 3 MG TABS TAKE 1 TABLET BY MOUTH ONCE DAILY AT LEAST 30 MINUTES BEFORE FIRST FOOD, BEVERAGE, OR OTHER ORAL MEDS   ??? tamsulosin 0.4 mg capsule TAKE ONE CAPSULE BY MOUTH TWO TIMES A DAY     No current facility-administered medications for this visit.       Allergies:   Allergies   Allergen Reactions   ??? Ibuprofen Other (See Comments)     Soars in mouth     The patient denies allergy to latex or tape.     Social history:   Social History     Tobacco Use   ??? Smoking status: Never Smoker   ??? Smokeless tobacco: Never Used   Substance Use Topics   ??? Alcohol use: Not Currently   ??? Drug use: Not on file       Family history:   The patient denies  having family history of blood clots.     Review of Systems: Negative for 14 point review of systems unless specified above.    ROS:  A COVID-19 questionnaire was filled out by the patient prior to the visit that included questions about having had a positive COVID-19 diagnosis in the last 14 days,  contact with anybody diagnosed with  COVID-19 in the last 14 days, fever, headaches, unexplained muscle pain, weakness, diarrhea, nausea, vomiting, abdominal pain, respiratory illness/cough, shortness of breath, loss of smell, loss of taste, rash, skin irritation, unexplained hemorrhage, and fatigue. The responses to those questions were negative. A temperature was taken and it was less than 100 F.    Physical Examination:  Vitals:    12/15/20 1432   Weight: (!) 260 lb (117.9 kg)   Height: 6' 1'' (1.854 m)    Body mass index is 34.3 kg/m???.  The patient is well developed, well nourished, and in no acute distress.    Body habitus is obese    Patient is oriented x 3, to place, time and person. Judgment, mood and affect are appropriate.      External exam of eyes, ears, nose and mouth reveals no deformities, scars or lesions.    Respirations are regular and unlabored.  Respiratory effort is normal with no evidence of abnormal intercostal retraction, or excessive use of accessory muscles.    Lungs: clear and equal bilaterally.    Cardiac: normal rate and rhythm, no murmurs, clicks, or gallops.     Skin appears to be normal without rashes, lesions, or ulcers.    Pulse is regular.  No cyanosis, clubbing or edema is evident. Peripheral lower extremities are well perfused.     Patient has no movement disorder or loss of sensation except as described below.    Gait and station are within normal limits except as described below.  No amputations or fixed deformities are evident.    KNEE EXAM  The patient is grossly neurologically intact from L2-S1. No lymphedema.  Skin has no lesions. Compartments are soft.   Gait:  []  Antalgic [x]  Normal []  Trendelenburg   Assistive devices: none    ???  Left Knee Exam:???  Surgical scars are noted.???  Fluctuation gelatonous 7x5 cm mass, lateral joint line adjacent to fibula head. No fluid wave???  Erythema present:??????????????????????????????????????????????????????[] ??????Yes???[x] ??????No   Bruising or ecchymosis:??????????????????????????????[] ??????Yes???[x] ??????No   Palpable masses???present:??????????????????[] ??????Yes???[x] ??????No   [x] ??????No effusion present???  [] ??????Small Effusion present [] ??????Moderate Effusion present ???????????????[] ??????Large effusion ???present  Palpation tenderness severity: [] ??????None ??????[x] ??????Mild ??????[] ??????Moderate ?????????[] ??????Severe  Palpable tenderness location:  ????????????????????????????????????[] ??????N/A  ????????????????????????????????????[x] ??????Medial joint line  ????????????????????????????????????[] ??????Lateral joint line   ????????????????????????????????????[] ??????Posteromedial???joint line  ????????????????????????????????????[] ??????Posterolateral joint line  ????????????????????????????????????[] ??????Posterior knee  ????????????????????????????????????[] ??????Patellofemoral joint line  ????????????????????????????????????[] ??????Diffusely   ????????????????????????????????????[] ??????Quadriceps tendon  ????????????????????????????????????[] ??????Patellar tendon  ????????????????????????????????????[] ??????Pes anserine bursa  ????????????????????????????????????[] ??????Patella  ????????????????????????????????????[] ??????Diffusely ??????  ???  Range of motion is 0??? extension to???94??? flexion  Guarding and crepitus:?????????????????????????????????[] ??????Yes???[x] ??????No   Flexion contractures:??????????????????????????????????????????[] ??????Yes???[x] ??????No   The knee is???unstable???with 1+???laxity with Varus???stress at 0, 30, 90???.   ???  Special tests:   Anterior drawer test:?????????????????????????????????????????????[] ?????Positive??????????????????[x] ?????Negative???????????????[] ?????Not Tested  Posterior drawer test:??????????????????????????????????????????[] ?????Positive??????????????????[x] ?????Negative???????????????[] ?????Not Tested  Patellar apprehension test:???????????????[] ?????Positive??????????????????[x] ?????Negative???????????????[] ?????Not Tested  Patellar grind test:?????????????????????????????????????????????????????????[] ?????Positive??????????????????[x] ?????Negative???????????????[] ?????Not Tested  Muscle Strength of the knee is 5/5. Patellar tracking is normal  ???  RADIOGRAPHS:???Not obtained today. Previous radiographs were reviewed in office.   ???  LABS: Labwork performed at [facility] on November 27, 2020. Study shows:  Moderate Calcium Pyrophosphate Dihydrate  Unable to perform WBC count  ???  IMPRESSION:???  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  The patient understands the proposed procedure including the risks of bleeding, infection, damage to the nerves  and vessels, some numbness along the area where the cutaneous skin nerves can be injured from incisions, stiffness, persistent pain and swelling, need for further surgery, loss of limb, loss of life.  No guarantee of result was given.  The patient gives an informed consent and wishes to proceed, understanding the risks, benefits and alternatives.    The patient verbalized full understanding of the benefits and risks of the surgery and offered consent to perform the operation.    A model of the knee implant was used during today's discussion to teach the patient basic details of knee arthroplasty surgery and its function.    The patient will be provided with a set of TED compression stockings along with a walker/cane while admitted in the hospital. The typical hospital stay is leaving the day of surgery, and staying until cleared by PT and the physician hospitalist. Occasionally patients will be sent to a rehab center after their hospital stay. The facility you are sent to will be determined by your insurance company, we can make recommendations but the final decision rests with your insurance carrier.    The patient will start outpatient physical therapy within 3-5 days after surgery and was provided with a prescription.     Additionally, he was provided with a prescription for all appropriate pain medications, DVT prophylaxis, and anti-inflammatories. The patient was prescribed the following medications:  ??? Percoet 5-325  ??? No anti-inflammatories due to co morbidities ulcerative colitis   ??? Aspirin 81 mg  ??? Lyrica 75 mg once nightly  ??? Colace 100 mg twice daily     The cures database was reviewed today.    The patient will follow-up with our office approximately two weeks after the procedure.    Today???s visit consisted of 20 minutes of face to face time and an additions 25 minutes of record, lab, medical clearance, X-ray, and MRI review.    Available labs were reviewed today.  Abnormal labs have been directed back to the PTP to be cleared.     Thank you for allowing me to participate in Daryl Graves???s care. Please do not hesitate to contact me if you have any questions or concerns.    The patient was examined and evaluated by Thurmon Fair PA-C for Dr. Nicolasa Ducking, D.O.      Olegario Shearer D. Otilio Miu, PA-C  Physician assistant for Dr. Nicolasa Ducking, D.O.  [facility]   12/15/2020

## 2020-12-24 ENCOUNTER — Ambulatory Visit: Payer: Commercial Managed Care - HMO

## 2020-12-24 ENCOUNTER — Ambulatory Visit: Payer: PRIVATE HEALTH INSURANCE

## 2020-12-24 MED ORDER — HYDROCODONE-ACETAMINOPHEN 10-325 MG PO TABS
1 | ORAL_TABLET | Freq: Four times a day (QID) | ORAL | 0 refills | Status: AC | PRN
Start: 2020-12-24 — End: ?

## 2021-01-01 NOTE — Progress Notes
Date: 01/06/2021  Name: Daryl Graves   MRN#: [id number]   DOB: 04/29/1951   Age: 70 y.o.      Dr. Leane Call, DO    The patient was seen at the [facility] office.    CHIEF COMPLAINT: Status-post open left knee ganglion cyst removal    INTERIM HISTORY: Daryl Graves presents two weeks post-operatively from a open left knee ganglion cyst removal. The patient's pain level today is a 3/10 in severity and is dull in quality. The patient denies fevers, chills or systemic complaints. he denies any feelings of mechanical locking or catching.  The patient denies calf tenderness, leg swelling, cough, or chest pain. He stopped taking his narcotics three days after his surgery. Patient is requesting more Lyrica. Overall, he states at least 50% improvement in his symptoms since his date of surgery.     ROS:  A COVID-19 questionnaire was filled out by the patient prior to the visit that included questions about having had a positive COVID-19 diagnosis in the last 14 days, contact with anybody diagnosed with  COVID-19 in the last 14 days, fever, headaches, unexplained muscle pain, weakness, diarrhea, nausea, vomiting, abdominal pain, respiratory illness/cough, shortness of breath, loss of smell, loss of taste, rash, skin irritation, unexplained hemorrhage, and fatigue. The responses to those questions were negative. A temperature was taken and it was less than 100 F.      PHYSICAL EXAM:  Constitutional: Daryl Graves is a 70 y.o. male in no acute distress.   There were no vitals filed for this visit. There is no height or weight on file to calculate BMI.  Psyc: he is alert and oriented x3 with a normal mood and affect.   HENT: AT/NC; hearing intact  Eyes: PERRLA. Extra-ocular movements are intact.  Skin: Normal temperature. There are no rashes, ulcerations, or lesions.  Lymphatic: No induration or enlargement.  Extremities: No swelling or calf tenderness. No clubbing. No cyanosis.  Gait:  []  Antalgic [x]  Normal []  Trendelenburg   Assistive devices: none    Left knee: Patient???s lateral open incisions are healing well. There is no drainage, bruising, or ecchymosis. No signs or symptoms of infection. There is minimal swelling.  A small effusion is present. Palpation of the knee reveals mild tenderness. There is no calf tenderness with palpation. Negative Homan's sign.    Range of motion is 0? extension to 110? flexion with no pain. Knee is stable to varus/valgus stress at 0,30, and 90 degree. Normal patellar tracking. Muscle Strength of the knee is 5/5 with respect to flexors and extensors.    Special tests:   Anterior drawer test:  []  Positive [x]  Negative []  Not Tested  Posterior drawer test:  []  Positive [x]  Negative []  Not Tested  Patellar apprehension test: []  Positive [x]  Negative []  Not Tested  Patellar grind test:  []  Positive [x]  Negative []  Not Tested    RADIOGRAPHS: Three views of the left knee and 3 view(s) comparison of the opposite knee were ordered, obtained and reviewed by me here at [facility] today and reviewed with the patient. Radiographs demonstrate a TKA in good position and alignment.  There is optimal implant cement-bone interface without radiolucencies. There is no evidence of loosening or other abnormalities. The patella is well located.        IMPRESSION:  1.  Status post left total knee arthroplasty  2.  Status-post open left knee ganglion cyst removal    PLAN: The patient is doing okay. We  did spend some time reviewing his surgical procedure in detail with the patient.  Daily ROM and strengthening exercises were encouraged today and he was given a handout demonstrating these. he is to continue his anti-inflammatory and wean off of the narcotics.  The patient is encouraged to move the knee and walk as much as tolerated. He was given a refill of his Lyrica.     FOLLOW-UP: Return to clinic in 4 weeks for re-evaluation.    Thank you for allowing me to participate in Pinchas Reither Leathers???s care. Please do not hesitate to contact me if you have any questions or concerns.           Leane Call, D.O.  Adult Reconstruction Specialist  ORTHOPEDIC SURGERY  01/01/2021    Susa Simmonds, MD

## 2021-01-06 ENCOUNTER — Ambulatory Visit: Payer: PRIVATE HEALTH INSURANCE

## 2021-01-06 DIAGNOSIS — M25562 Pain in left knee: Secondary | ICD-10-CM

## 2021-01-06 MED ORDER — PREGABALIN 75 MG PO CAPS
ORAL_CAPSULE | 0 refills | Status: AC
Start: 2021-01-06 — End: ?

## 2021-01-06 MED ORDER — CELECOXIB 200 MG PO CAPS
200 mg | ORAL_CAPSULE | Freq: Every day | ORAL | 6 refills | Status: AC
Start: 2021-01-06 — End: ?

## 2021-01-14 ENCOUNTER — Encounter: Payer: Self-pay | Admitting: Dermatology

## 2021-01-14 ENCOUNTER — Other Ambulatory Visit: Payer: Self-pay

## 2021-01-14 ENCOUNTER — Ambulatory Visit: Payer: PPO | Admitting: Dermatology

## 2021-01-14 DIAGNOSIS — L578 Other skin changes due to chronic exposure to nonionizing radiation: Secondary | ICD-10-CM | POA: Diagnosis not present

## 2021-01-14 DIAGNOSIS — L570 Actinic keratosis: Secondary | ICD-10-CM | POA: Diagnosis not present

## 2021-01-14 DIAGNOSIS — Z85828 Personal history of other malignant neoplasm of skin: Secondary | ICD-10-CM | POA: Diagnosis not present

## 2021-01-14 NOTE — Progress Notes (Signed)
   Follow-Up Visit   Subjective  Ryan Blevins is a 70 y.o. male who presents for the following: Lesions (On the L preauricular and R zygoma that are crusted and irregular, pt is concerned and would like them checked).  The following portions of the chart were reviewed this encounter and updated as appropriate:   Tobacco  Allergies  Meds  Problems  Med Hx  Surg Hx  Fam Hx     Review of Systems:  No other skin or systemic complaints except as noted in HPI or Assessment and Plan.  Objective  Well appearing patient in no apparent distress; mood and affect are within normal limits.  A focused examination was performed including the face. Relevant physical exam findings are noted in the Assessment and Plan.  Objective  L preauricular x 1, R zygoma x 1 (2): Erythematous thin papules/macules with gritty scale.   Assessment & Plan  AK (actinic keratosis) (2) L preauricular x 1, R zygoma x 1  Destruction of lesion - L preauricular x 1, R zygoma x 1 Complexity: simple   Destruction method: cryotherapy   Informed consent: discussed and consent obtained   Timeout:  patient name, date of birth, surgical site, and procedure verified Lesion destroyed using liquid nitrogen: Yes   Region frozen until ice ball extended beyond lesion: Yes   Outcome: patient tolerated procedure well with no complications   Post-procedure details: wound care instructions given    Actinic Damage - chronic, secondary to cumulative UV radiation exposure/sun exposure over time - diffuse scaly erythematous macules with underlying dyspigmentation - Recommend daily broad spectrum sunscreen SPF 30+ to sun-exposed areas, reapply every 2 hours as needed.  - Recommend staying in the shade or wearing long sleeves, sun glasses (UVA+UVB protection) and wide brim hats (4-inch brim around the entire circumference of the hat). - Call for new or changing lesions.  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  - Call if any new or changing lesions are noted between office visits  Return in about 3 months (around 04/15/2021) for AK recheck - cancel 04/20/21 appointment and r/s for July instead.  Documentation: I have reviewed the above documentation for accuracy and completeness, and I agree with the above.  Sarina Ser, MD

## 2021-01-17 ENCOUNTER — Encounter: Payer: Self-pay | Admitting: Dermatology

## 2021-01-19 DIAGNOSIS — Z8546 Personal history of malignant neoplasm of prostate: Secondary | ICD-10-CM | POA: Diagnosis not present

## 2021-02-08 ENCOUNTER — Ambulatory Visit: Payer: Commercial Managed Care - HMO | Attending: Medical

## 2021-02-08 DIAGNOSIS — M25862 Other specified joint disorders, left knee: Secondary | ICD-10-CM

## 2021-02-08 NOTE — Progress Notes
Date: 02/08/2021  Name: Daryl Graves   MRN#: [id number]   DOB: 1951/08/09   Age: 70 y.o.      Dr. Leane Call, DO  Thurmon Fair PA-C    The patient was seen at the [facility] office.    CHIEF COMPLAINT: Status-post open left knee ganglion cyst removal    INTERIM HISTORY: Daryl Graves presents two weeks post-operatively from a open left knee ganglion cyst removal. The patient's pain level today is a 3/10 in severity and is dull in quality. The patient denies fevers, chills or systemic complaints. he denies any feelings of mechanical locking or catching.  The patient denies calf tenderness, leg swelling, cough, or chest pain. He stopped taking his narcotics three days after his surgery.  Overall, he states at least 80% improvement in his symptoms since his date of surgery.     ROS:  A COVID-19 questionnaire was filled out by the patient prior to the visit that included questions about having had a positive COVID-19 diagnosis in the last 14 days, contact with anybody diagnosed with  COVID-19 in the last 14 days, fever, headaches, unexplained muscle pain, weakness, diarrhea, nausea, vomiting, abdominal pain, respiratory illness/cough, shortness of breath, loss of smell, loss of taste, rash, skin irritation, unexplained hemorrhage, and fatigue. The responses to those questions were negative. A temperature was taken and it was less than 100 F.      PHYSICAL EXAM:  Constitutional: Daryl Graves is a 70 y.o. male in no acute distress.   Vitals:    02/08/21 1209   Weight: (!) 259 lb 14.8 oz (117.9 kg)   Height: 6' 1'' (1.854 m)    Body mass index is 34.29 kg/m?Marland Kitchen  Psyc: he is alert and oriented x3 with a normal mood and affect.   HENT: AT/NC; hearing intact  Eyes: PERRLA. Extra-ocular movements are intact.  Skin: Normal temperature. There are no rashes, ulcerations, or lesions.  Lymphatic: No induration or enlargement.  Extremities: No swelling or calf tenderness. No clubbing. No cyanosis.  Gait:  []  Antalgic [x]  Normal []  Trendelenburg   Assistive devices: none    Left knee: Patient?s lateral open incisions are healing well. There is no drainage, bruising, or ecchymosis. No signs or symptoms of infection. There is minimal swelling.  A small effusion is present. Palpation of the knee reveals mild tenderness. There is no calf tenderness with palpation. Negative Homan's sign.    Range of motion is 0? extension to 102? flexion with no pain. Knee is stable to varus/valgus stress at 0,30, and 90 degree. Normal patellar tracking. Muscle Strength of the knee is 5/5 with respect to flexors and extensors.    Special tests:   Anterior drawer test:  []  Positive [x]  Negative []  Not Tested  Posterior drawer test:  []  Positive [x]  Negative []  Not Tested  Patellar apprehension test: []  Positive [x]  Negative []  Not Tested  Patellar grind test:  []  Positive [x]  Negative []  Not Tested    RADIOGRAPHS: Three views of the left knee and 3 view(s) comparison of the opposite knee were ordered, obtained and reviewed by me here at [facility] today and reviewed with the patient. Radiographs demonstrate a TKA in good position and alignment.  There is optimal implant cement-bone interface without radiolucencies. There is no evidence of loosening or other abnormalities. The patella is well located.        IMPRESSION:  1.  Status post left total knee arthroplasty  2.  Status-post open left knee  ganglion cyst removal    PLAN: The patient is doing better. He does feel 80% improved as compared to before his surgery.  Daily ROM and strengthening exercises were encouraged today and he was given a handout demonstrating these. he is to continue his anti-inflammatory and wean off of the narcotics.  The patient is encouraged to move the knee and walk as much as tolerated.     FOLLOW-UP: Return to clinic in 4-6 weeks for re-evaluation.    Thank you for allowing me to participate in Daryl Graves?s care. Please do not hesitate to contact me if you have any questions or concerns.        The patient was examined and evaluated by Thurmon Fair PA-C for Dr. Nicolasa Ducking, D.O.      Olegario Shearer D. Otilio Miu, PA-C  Physician assistant for Dr. Nicolasa Ducking, D.O.  [facility]  02/08/2021    Susa Simmonds, MD

## 2021-02-10 ENCOUNTER — Other Ambulatory Visit: Payer: Self-pay

## 2021-02-10 ENCOUNTER — Encounter: Payer: Self-pay | Admitting: Family Medicine

## 2021-02-10 ENCOUNTER — Ambulatory Visit (INDEPENDENT_AMBULATORY_CARE_PROVIDER_SITE_OTHER): Payer: PPO | Admitting: Family Medicine

## 2021-02-10 VITALS — BP 167/97 | HR 99 | Temp 98.5°F | Ht 72.0 in

## 2021-02-10 DIAGNOSIS — R059 Cough, unspecified: Secondary | ICD-10-CM

## 2021-02-10 DIAGNOSIS — J988 Other specified respiratory disorders: Secondary | ICD-10-CM

## 2021-02-10 MED ORDER — DOXYCYCLINE HYCLATE 100 MG PO TABS: 100.0000 mg | ORAL_TABLET | Freq: Two times a day (BID) | ORAL | 0 refills | Status: AC

## 2021-02-10 NOTE — Progress Notes (Signed)
      Ryan Merida T. Hugo Lybrand, MD Primary Care and Sports Medicine [facility] at [facility] [place], [address] Phone: [phone]  FAX: [phone]  Ryan Blevins - 70 y.o. male  MRN [id number]  Date of Birth: 09-Jul-1951  Visit Date: 02/10/2021  PCP: Tonia Ghent, MD  Referred by: Tonia Ghent, MD  Virtual Visit via Video Note:  I connected with  Ryan Blevins on 02/10/2021  9:20 AM EDT by a video enabled telemedicine application and verified that I am speaking with the correct person using two identifiers.   Location patient: home computer, tablet, or smartphone Location provider: work or home office Consent: Verbal consent directly obtained from [employer]. Persons participating in the virtual visit: patient, provider  I discussed the limitations of evaluation and management by telemedicine and the availability of in person appointments. The patient expressed understanding and agreed to proceed.  Chief Complaint  Patient presents with  . Cough    Two negative home test  . Nasal Congestion  . Generalized Body Aches    History of Present Illness:  Cough, congestion, nasal congestion, body aches.  Ongoing for 3 days, a little bit before that.  Feels like "death warmed over."   no known influenza contacts.  Diarrhea, coughing all the time. Sinus pressure and pain. Had some fever yesterday AM.  99.7 No fever now.  He also does have some diffuse myalgia.  Covid tests were neg, yest.  Mucinex, Flonase.  Atypical pna vs bronch vs false neg Covid tests  Review of Systems as above: See pertinent positives and pertinent negatives per HPI No acute distress verbally   Observations/Objective/Exam:  An attempt was made to discern vital signs over the phone and per patient if applicable and possible.   General:    Alert, Oriented, appears well and in no acute distress  Pulmonary:     On inspection no signs  of respiratory distress.  Psych / Neurological:     Pleasant and cooperative.  Assessment and Plan:    ICD-10-CM   1. Cough  R05.9   2. Congestion of respiratory tract  J98.8    Bronchitis versus atypical pneumonia.  COVID test are negative x2.  Regardless the patient will isolate greater than 5 days and then mask.  Continue with supportive care with over-the-counter cough and cold medication, and also going to send him in some doxycycline.  I discussed the assessment and treatment plan with the patient. The patient was provided an opportunity to ask questions and all were answered. The patient agreed with the plan and demonstrated an understanding of the instructions.   The patient was advised to call back or seek an in-person evaluation if the symptoms worsen or if the condition fails to improve as anticipated.  Follow-up: prn unless noted otherwise below No follow-ups on file.  Meds ordered this encounter  Medications  . doxycycline (VIBRA-TABS) 100 MG tablet    Sig: Take 1 tablet (100 mg total) by mouth 2 (two) times daily for 10 days.    Dispense:  20 tablet    Refill:  0   No orders of the defined types were placed in this encounter.   Signed,  Maud Deed. Alaylah Heatherington, MD

## 2021-03-02 DIAGNOSIS — H2513 Age-related nuclear cataract, bilateral: Secondary | ICD-10-CM | POA: Diagnosis not present

## 2021-03-25 NOTE — Progress Notes
Date: 03/30/2021  Name: Daryl Graves   MRN#: [id number]   DOB: Apr 16, 1951   Age: 70 y.o.      Dr. Leane Call, DO  Thurmon Fair PA-C    The patient was seen at the [facility] office.    CHIEF COMPLAINT: Status-post open left knee ganglion cyst removal    INTERIM HISTORY: Daryl Graves presents 8 weeks post-operatively from a open left knee ganglion cyst removal. The patient's pain level today is a 3/10 in severity and is dull in quality. The patient denies fevers, chills or systemic complaints. he denies any feelings of mechanical locking or catching.  The patient denies calf tenderness, leg swelling, cough, or chest pain. He stopped taking his narcotics three days after his surgery.  Overall, he states at least 80% improvement in his symptoms since his date of surgery.     ROS:  A COVID-19 questionnaire was filled out by the patient prior to the visit that included questions about having had a positive COVID-19 diagnosis in the last 14 days, contact with anybody diagnosed with  COVID-19 in the last 14 days, fever, headaches, unexplained muscle pain, weakness, diarrhea, nausea, vomiting, abdominal pain, respiratory illness/cough, shortness of breath, loss of smell, loss of taste, rash, skin irritation, unexplained hemorrhage, and fatigue. The responses to those questions were negative. A temperature was taken and it was less than 100 F.      PHYSICAL EXAM:  Constitutional: Daryl Graves is a 70 y.o. male in no acute distress.   There were no vitals filed for this visit. There is no height or weight on file to calculate BMI.  Psyc: he is alert and oriented x3 with a normal mood and affect.   HENT: AT/NC; hearing intact  Eyes: PERRLA. Extra-ocular movements are intact.  Skin: Normal temperature. There are no rashes, ulcerations, or lesions.  Lymphatic: No induration or enlargement.  Extremities: No swelling or calf tenderness. No clubbing. No cyanosis.  Gait:  []  Antalgic [x]  Normal []  Trendelenburg Assistive devices: none    Left knee: Patient?s lateral open incisions are healing well. There is no drainage, bruising, or ecchymosis. No signs or symptoms of infection. There is minimal swelling.  A small effusion is present. Palpation of the knee reveals mild tenderness. There is no calf tenderness with palpation. Negative Homan's sign.    Range of motion is 0? extension to 102? flexion with no pain. Knee is stable to varus/valgus stress at 0,30, and 90 degree. Normal patellar tracking. Muscle Strength of the knee is 5/5 with respect to flexors and extensors.    Special tests:   Anterior drawer test:  []  Positive [x]  Negative []  Not Tested  Posterior drawer test:  []  Positive [x]  Negative []  Not Tested  Patellar apprehension test: []  Positive [x]  Negative []  Not Tested  Patellar grind test:  []  Positive [x]  Negative []  Not Tested      RADIOGRAPHS: Not obtained today. Previous radiographs were reviewed in office.         IMPRESSION:  1.  Status post left total knee arthroplasty  2.  Status-post open left knee ganglion cyst removal    PLAN: The patient is doing better. He does feel 80% improved as compared to before his surgery.  Daily ROM and strengthening exercises were encouraged today and he was given a handout demonstrating these. he is to continue his anti-inflammatory and wean off of the narcotics.  The patient is encouraged to move the knee and walk as much  as tolerated.     FOLLOW-UP: Return to clinic in 4-6 weeks for re-evaluation.    Thank you for allowing me to participate in Daryl Graves?s care. Please do not hesitate to contact me if you have any questions or concerns.         Leane Call, D.O.  Adult Reconstruction Specialist  ORTHOPEDIC SURGERY  03/25/2021        Susa Simmonds, MD

## 2021-03-30 ENCOUNTER — Ambulatory Visit: Payer: Commercial Managed Care - HMO

## 2021-03-30 DIAGNOSIS — M25862 Other specified joint disorders, left knee: Secondary | ICD-10-CM

## 2021-04-11 IMAGING — CR CHEST - 2 VIEW
2 series · 2 of 2 positions shown · non-contrast
Comparison: 10/14/2010

CLINICAL DATA: Shortness of breath

EXAM:
CHEST - 2 VIEW

[chest pa]
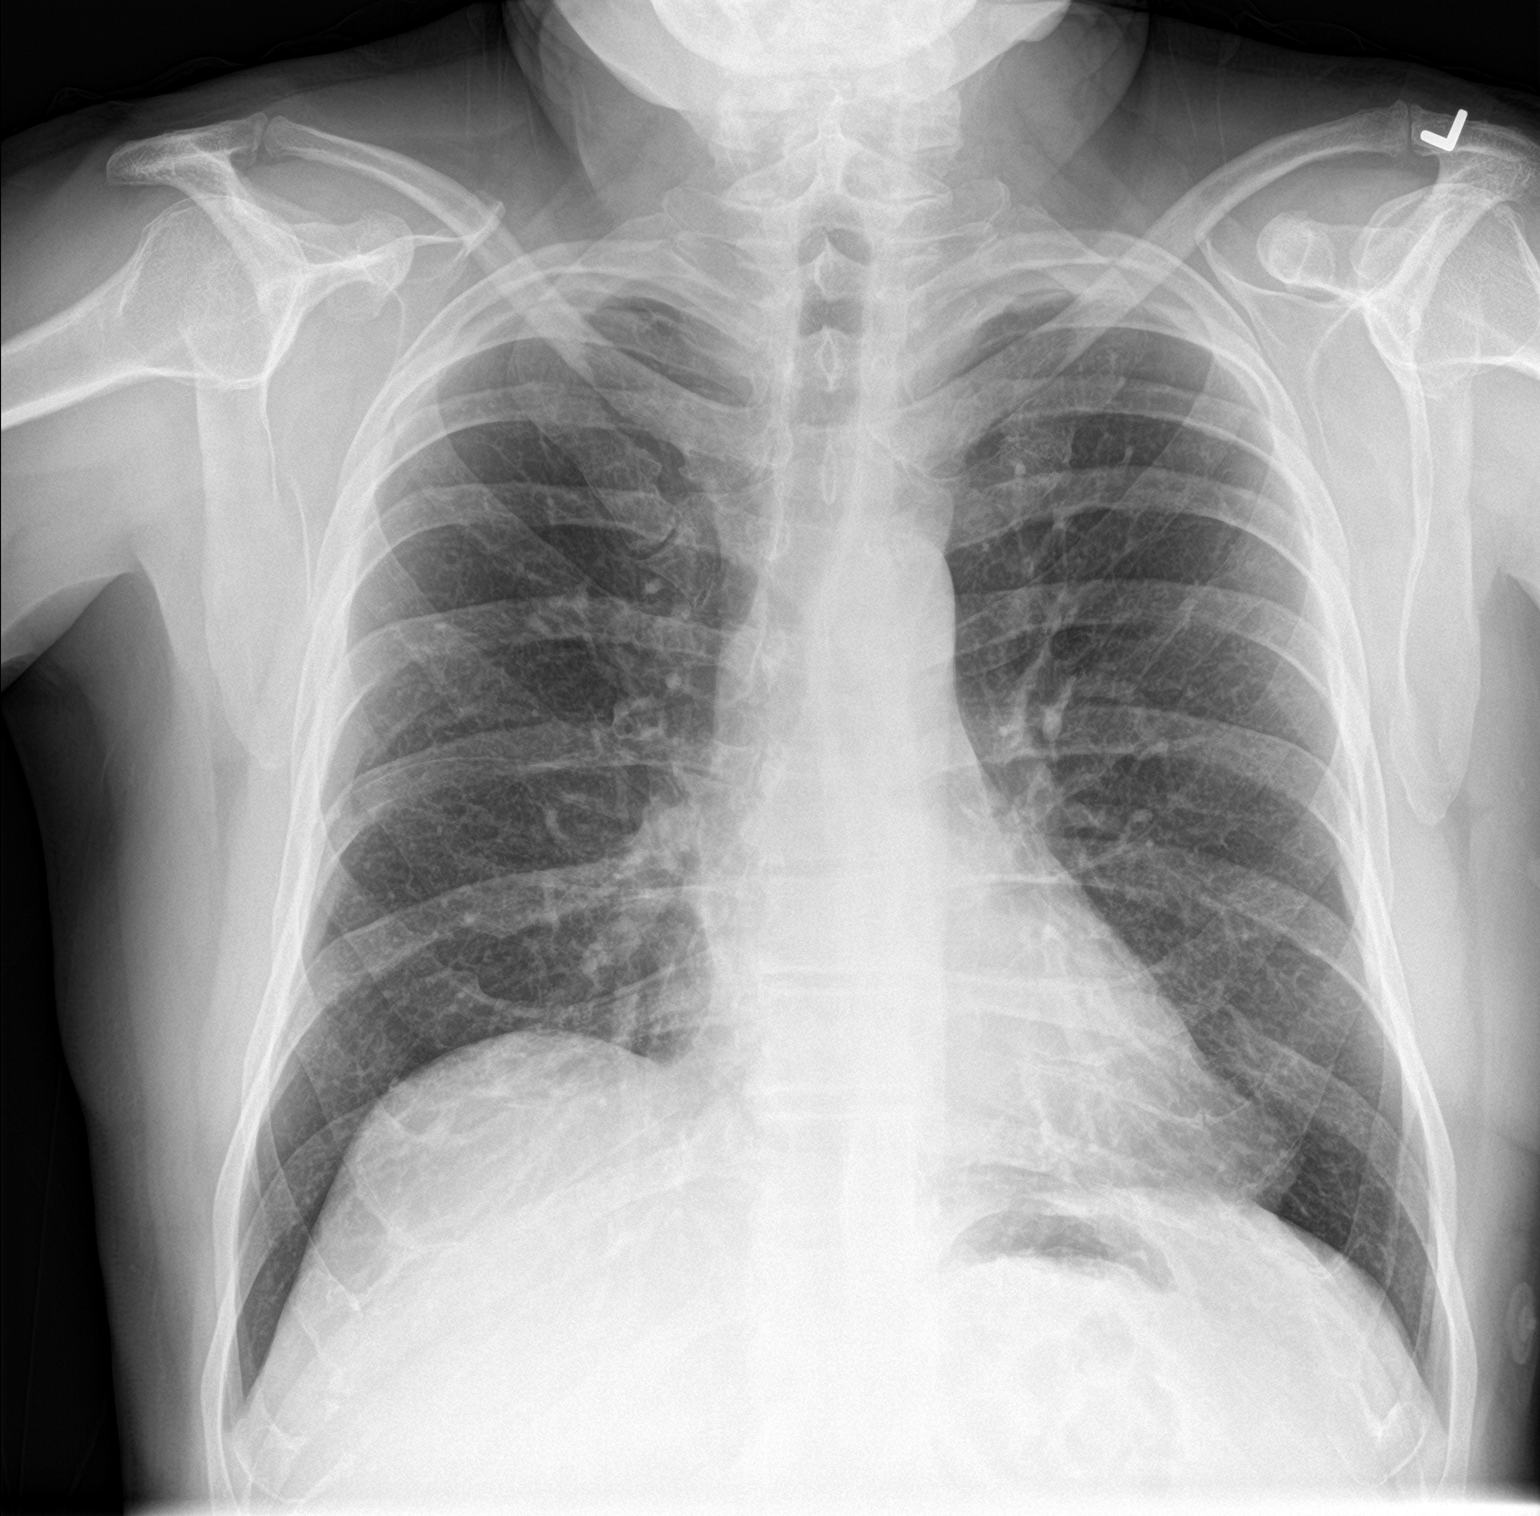

[chest lat]
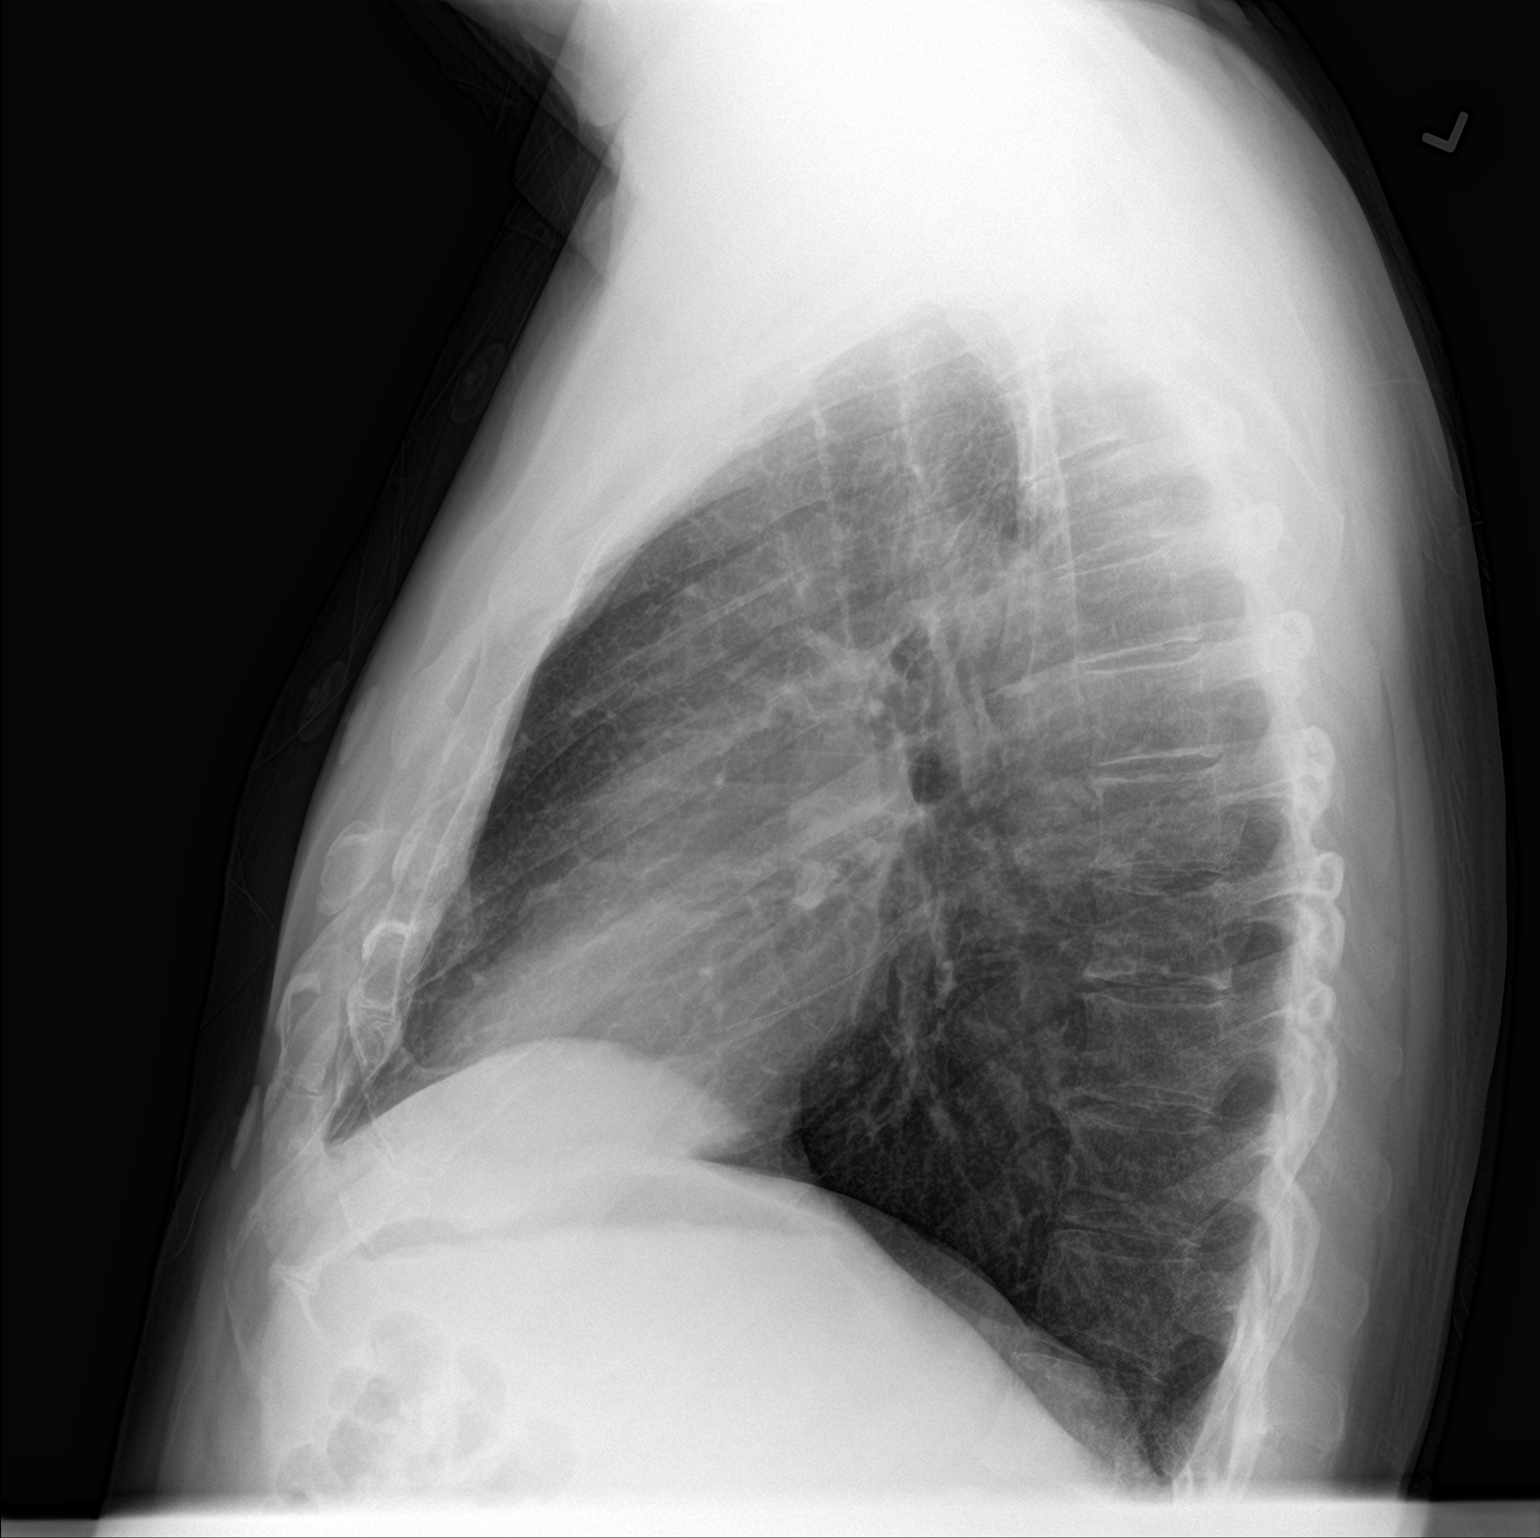

[2 of 2 positions shown; findings below may reference images not displayed]

FINDINGS: Hyperinflated lungs without focal opacity or pleural effusion.
Normal cardiomediastinal silhouette. No pneumothorax.
IMPRESSION: No active cardiopulmonary disease.

## 2021-04-12 ENCOUNTER — Encounter: Payer: Self-pay | Admitting: Dermatology

## 2021-04-12 ENCOUNTER — Other Ambulatory Visit: Payer: Self-pay

## 2021-04-12 ENCOUNTER — Ambulatory Visit: Payer: PPO | Admitting: Dermatology

## 2021-04-12 DIAGNOSIS — L578 Other skin changes due to chronic exposure to nonionizing radiation: Secondary | ICD-10-CM

## 2021-04-12 DIAGNOSIS — L821 Other seborrheic keratosis: Secondary | ICD-10-CM

## 2021-04-12 DIAGNOSIS — L820 Inflamed seborrheic keratosis: Secondary | ICD-10-CM

## 2021-04-12 DIAGNOSIS — L570 Actinic keratosis: Secondary | ICD-10-CM

## 2021-04-12 NOTE — Patient Instructions (Addendum)

## 2021-04-12 NOTE — Progress Notes (Signed)
   Follow-Up Visit   Subjective  Ryan Blevins is a 70 y.o. male who presents for the following: Actinic Keratosis (3 months f/u on Aks on face ) and Skin Problem (Check growth on the chest growing and irritating ).  The following portions of the chart were reviewed this encounter and updated as appropriate:   Tobacco  Allergies  Meds  Problems  Med Hx  Surg Hx  Fam Hx     Review of Systems:  No other skin or systemic complaints except as noted in HPI or Assessment and Plan.  Objective  Well appearing patient in no apparent distress; mood and affect are within normal limits.  A focused examination was performed including face, neck, chest and back. Relevant physical exam findings are noted in the Assessment and Plan.  face, ears x 11 (11) Erythematous thin papules/macules with gritty scale.   chest x 1 Erythematous keratotic or waxy stuck-on papule or plaque.    Assessment & Plan  AK (actinic keratosis) (11) face, ears x 11  Destruction of lesion - face, ears x 11 Complexity: simple   Destruction method: cryotherapy   Informed consent: discussed and consent obtained   Timeout:  patient name, date of birth, surgical site, and procedure verified Lesion destroyed using liquid nitrogen: Yes   Region frozen until ice ball extended beyond lesion: Yes   Outcome: patient tolerated procedure well with no complications   Post-procedure details: wound care instructions given    Inflamed seborrheic keratosis chest x 1  Destruction of lesion - chest x 1 Complexity: simple   Destruction method: cryotherapy   Informed consent: discussed and consent obtained   Timeout:  patient name, date of birth, surgical site, and procedure verified Lesion destroyed using liquid nitrogen: Yes   Region frozen until ice ball extended beyond lesion: Yes   Outcome: patient tolerated procedure well with no complications   Post-procedure details: wound care instructions given    Actinic  Damage - chronic, secondary to cumulative UV radiation exposure/sun exposure over time - diffuse scaly erythematous macules with underlying dyspigmentation - Recommend daily broad spectrum sunscreen SPF 30+ to sun-exposed areas, reapply every 2 hours as needed.  - Recommend staying in the shade or wearing long sleeves, sun glasses (UVA+UVB protection) and wide brim hats (4-inch brim around the entire circumference of the hat). - Call for new or changing lesions.   Seborrheic Keratoses - Stuck-on, waxy, tan-brown papules and/or plaques  - Benign-appearing - Discussed benign etiology and prognosis. - Observe - Call for any changes  Return in about 6 months (around 10/13/2021) for TBSE, hx of AKs .  IMarye Round, CMA, am acting as scribe for Sarina Ser, MD .  Documentation: I have reviewed the above documentation for accuracy and completeness, and I agree with the above.  Sarina Ser, MD

## 2021-04-20 ENCOUNTER — Ambulatory Visit: Payer: PPO | Admitting: Dermatology

## 2021-04-27 ENCOUNTER — Ambulatory Visit: Payer: PRIVATE HEALTH INSURANCE

## 2021-04-27 DIAGNOSIS — M7122 Synovial cyst of popliteal space [Baker], left knee: Secondary | ICD-10-CM

## 2021-04-27 MED ADMIN — TRIAMCINOLONE ACETONIDE 40 MG/ML IJ SUSP: INTRA_ARTICULAR | @ 19:00:00 | Stop: 2021-04-27 | NDC 00003029320

## 2021-04-27 MED ADMIN — LIDOCAINE HCL (PF) 2 % IJ SOLN: INTRA_ARTICULAR | @ 19:00:00 | Stop: 2021-04-27 | NDC 63323049507

## 2021-04-27 MED ADMIN — BUPIVACAINE HCL (PF) 0.5 % IJ SOLN: INTRA_ARTICULAR | @ 19:00:00 | Stop: 2021-04-27 | NDC 55150016910

## 2021-04-27 NOTE — Progress Notes
Date: 04/27/2021  Name: Daryl Graves   MRN#: [id number]   DOB: Jan 03, 1951   Age: 70 y.o.      Dr. Leane Call, DO  Thurmon Fair PA-C    The patient was seen at the [facility] office.    CHIEF COMPLAINT: Status-post open left knee ganglion cyst removal    INTERIM HISTORY: Daryl Graves presents 4 months post-operatively from a open left knee ganglion cyst removal. The patient's pain level today is a 3/10 in severity and is dull in quality. The patient denies fevers, chills or systemic complaints. he denies any feelings of mechanical locking or catching.  However, he states his symptoms will occasionally keep him up at night and are slowly impacting his daily and recreational activities. Patient's cyst has returned. The patient recently underwent an MRI with MARS study of the left knee, which will be reviewed in clinic today.      ROS:  A COVID-19 questionnaire was filled out by the patient prior to the visit that included questions about having had a positive COVID-19 diagnosis in the last 14 days, contact with anybody diagnosed with  COVID-19 in the last 14 days, fever, headaches, unexplained muscle pain, weakness, diarrhea, nausea, vomiting, abdominal pain, respiratory illness/cough, shortness of breath, loss of smell, loss of taste, rash, skin irritation, unexplained hemorrhage, and fatigue. The responses to those questions were negative. A temperature was taken and it was less than 100 F.      PHYSICAL EXAM:  Constitutional: Daryl Graves is a 70 y.o. male in no acute distress.   Vitals:    04/27/21 0842   Weight: (!) 259 lb 14.8 oz (117.9 kg)   Height: 6' 1'' (1.854 m)    Body mass index is 34.29 kg/m?Marland Kitchen  Psyc: he is alert and oriented x3 with a normal mood and affect.   HENT: AT/NC; hearing intact  Eyes: PERRLA. Extra-ocular movements are intact.  Skin: Normal temperature. There are no rashes, ulcerations, or lesions.  Lymphatic: No induration or enlargement.  Extremities: No swelling or calf tenderness. No clubbing. No cyanosis.  Gait:  []  Antalgic [x]  Normal []  Trendelenburg   Assistive devices: none    Left knee: Healed incision with 5 x 5 cm cystic mass is palpable. There is no drainage, bruising, or ecchymosis. No signs or symptoms of infection. There is minimal swelling.  A small effusion is present. Palpation of the knee reveals mild tenderness. There is no calf tenderness with palpation. Negative Homan's sign.    Range of motion is 0? extension to 110? flexion with no pain. Knee is unstable with +2 laxity with varus/valgus stress at 0,30, and 90 degree. Normal patellar tracking. Muscle Strength of the knee is 5/5 with respect to flexors and extensors.     Special tests:   Anterior drawer test:  []  Positive [x]  Negative []  Not Tested  Posterior drawer test:  []  Positive [x]  Negative []  Not Tested  Patellar apprehension test: []  Positive [x]  Negative []  Not Tested  Patellar grind test:  []  Positive [x]  Negative []  Not Tested      RADIOGRAPHS: Not obtained today. Previous radiographs were reviewed in office.      MRI: MRI with MARS of the left knee  performed at [facility] on April 08, 2021 was reviewed in clinic today. Study shows:  Postoperative changes from total knee arthroplasty again noted. Despite WARP Sequencing   Assessment of the bone-in the interface is compromised. There is again identified extensive intraosseous cystic  change within the posterior lateral posterior medial femoral condyles the implant similar to the prior study, again concerning for periprosthetic erosive change, particle disease and/or reactive synovitis.    Large cyst measuring 5 cm in greatest length within the deep subcutaneous tissues along the lateral joint line with the minimally larger than the prior study. The second smaller posterior lateral cyst is no longer evident.       IMPRESSION:  1.  Status post left total knee arthroplasty  2.  Status-post open left knee ganglion cyst removal  3.  5 x 5 cm cystic mass on lateral aspect of left knee    PLAN: The patient is doing okay. Unfortunately, the patient's cyst has returned in size.  He does feel 80% improved as compared to before his surgery, but his cyst has returned in size and severity.He continues to struggle with his pain.  A aspiration injection was recommended to the patient. This injection is both diagnostic and therapeutic. The risks associated with the injection were discussed with the patient and they have elected to proceed. We did aspirate 10cc of thick synovial fluid.     Large Joint/Bursa Drain/Inject: L knee    Date/Time: 04/27/2021 8:20 AM  Performed by: Leane Call, DO  Authorized by: Leane Call, DO     Consent Given by:  Patient  Site marked: the procedure site was marked    Timeout: prior to procedure the correct patient, procedure, and site was verified    Indications:  Pain, diagnostic evaluation and joint swelling  Location:  Knee  Site:  L knee  Approach:  Superolateral  Needle Size:  18 G  Medications:  80 mg triamcinolone acetonide 40 mg/mL; 4 mL bupivacaine PF 0.5%; 4 mL lidocaine PF 2%  Patient tolerance:  Patient tolerated the procedure well with no immediate complications   The risks and benefits of intra-articular joint aspiration and injection were carefully explained to the patient, informed consent was obtained.  Risks known to include:   1. Infection; 2. Vasovagal reaction; 3. Bleeding and bruising of the aspiration site from damage to arteries or veins; 4. Numbness and tingling related to damage to nerves.     Additionally the risks and benefits of a corticosteroid injection were carefully explained to the patient.   Risks are known to include:  1. Permanent thickening of the skin due to fat atrophy; 2. Permanent loss of skin pigmentation; 3. Temporary irritation of the injection site; for period increased pain following injection; 5. Vasovagal reaction; 6. Infection; 7. Allergic reaction and 8. Bruising of the injection site.  After explaining the risks and benefits of the patient consented to proceed with the injection.    Procedure:  Under strict sterile conditions the superior lateral aspect of the knee was meticulously prepped.  The skin was anesthetized with ethyl chloride followed by Betadine and alcohol.    The superolateral joint line was then aspirated.     The needle was then removed and the area was cleaned and dressed with a sterile dressing.  An Ace wrap was then applied to the knee to prevent reaccumulation of swelling.  Patient was instructed to use the Ace wrap intermittently throughout the proceeding days as well as ice, and elevation. The patient tolerated the procedure well, was able to ambulate without difficulty.        The patient is instructed to use ice, heat, and OTC pain medications as necessary to control any pain or swelling.  If the patient notes any  increased pain or swelling they should contact the office immediately. The patient may continue activities as tolerated.      FOLLOW-UP: Return to clinic in 1 month for re-evaluation.     Thank you for allowing me to participate in Daryl Graves?s care. Please do not hesitate to contact me if you have any questions or concerns.         Leane Call, D.O.  Adult Reconstruction Specialist  ORTHOPEDIC SURGERY  04/27/2021        Susa Simmonds, MD

## 2021-06-11 ENCOUNTER — Ambulatory Visit (INDEPENDENT_AMBULATORY_CARE_PROVIDER_SITE_OTHER): Payer: PPO | Admitting: Family Medicine

## 2021-06-11 ENCOUNTER — Other Ambulatory Visit: Payer: Self-pay

## 2021-06-11 ENCOUNTER — Encounter: Payer: Self-pay | Admitting: Family Medicine

## 2021-06-11 DIAGNOSIS — H6980 Other specified disorders of Eustachian tube, unspecified ear: Secondary | ICD-10-CM | POA: Diagnosis not present

## 2021-06-11 MED ORDER — AMOXICILLIN-POT CLAVULANATE 875-125 MG PO TABS: 1.0000 | ORAL_TABLET | Freq: Two times a day (BID) | ORAL | 0 refills | Status: DC

## 2021-06-11 NOTE — Patient Instructions (Signed)
Rest and fluids.  Continue flonase.  Gently try to pop your ears.   Start augmentin.  Update me as needed.

## 2021-06-11 NOTE — Progress Notes (Signed)
This visit occurred during the SARS-CoV-2 public health emergency.  Safety protocols were in place, including screening questions prior to the visit, additional usage of staff PPE, and extensive cleaning of exam room while observing appropriate contact time as indicated for disinfecting solutions.  His daughter has cancer and had mult surgeries, d/w pt.  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   R sided head pain/sinus pressure.   Episodic voice changes.  Vertigo when laying down on the R side.  No sx laying on L side.   Nasal passages are stuffy.   Sx going on for a few months.   No fevers but some sweats.  Still on flonase.  No sputum.  No cough.    Per HPI unless specifically indicated in ROS section   Meds, vitals, and allergies reviewed.   GEN: nad, alert and oriented HEENT: mucous membranes moist, TM w/o erythema, slow R TM movement on valsalva NECK: supple w/o LA CV: rrr. PULM: ctab, no inc wob EXT: no edema

## 2021-06-13 DIAGNOSIS — H6980 Other specified disorders of Eustachian tube, unspecified ear: Secondary | ICD-10-CM | POA: Insufficient documentation

## 2021-06-13 DIAGNOSIS — H6990 Unspecified Eustachian tube disorder, unspecified ear: Secondary | ICD-10-CM | POA: Insufficient documentation

## 2021-06-13 NOTE — Assessment & Plan Note (Signed)
With presumed underlying sinusitis.  Discussed options.  Still okay for outpatient follow-up Rest and fluids.  Continue flonase.  Gently perform Valsalva at home. Start augmentin.  Update me as needed.   He agrees to plan.

## 2021-07-01 ENCOUNTER — Ambulatory Visit: Payer: Commercial Managed Care - HMO

## 2021-07-01 MED ORDER — CELECOXIB 200 MG PO CAPS
200 mg | ORAL_CAPSULE | Freq: Every day | ORAL | 3 refills | Status: AC
Start: 2021-07-01 — End: ?

## 2021-07-01 NOTE — Progress Notes
Date: 07/01/2021  Name: Daryl Graves   MRN#: [id number]   DOB: [birth year]   Age: 70 y.o.      Dr. Leane Call, DO  Thurmon Fair PA-C    The patient was seen at the [facility] office.    CHIEF COMPLAINT: Status-post open left knee ganglion cyst removal    INTERIM HISTORY: Daryl Graves presents 6 months post-operatively from a open left knee ganglion cyst removal. The patient's pain level today is a 0-1/10 in severity and is dull in quality. The patient denies fevers, chills or systemic complaints. he denies any feelings of mechanical locking or catching. The cortisone injection performed on the last visit provided 100% relief that is still providing him relief. He states his cyst has stayed small and has not been bothersome. Patient continues with Celebrex for other joint problems.     PHYSICAL EXAM:  Constitutional: Daryl Graves is a 70 y.o. male in no acute distress.   Vitals:    07/01/21 0842   Weight: (!) 259 lb 14.8 oz (117.9 kg)   Height: 6' 1'' (1.854 m)    Body mass index is 34.29 kg/m?Marland Kitchen  Psyc: he is alert and oriented x3 with a normal mood and affect.   HENT: AT/NC; hearing intact  Eyes: PERRLA. Extra-ocular movements are intact.  Skin: Normal temperature. There are no rashes, ulcerations, or lesions.  Lymphatic: No induration or enlargement.  Extremities: No swelling or calf tenderness. No clubbing. No cyanosis.  Gait:  []  Antalgic [x]  Normal []  Trendelenburg   Assistive devices: none    Left knee: Healed incision with 5 x 5 cm cystic mass is palpable. There is no drainage, bruising, or ecchymosis. No signs or symptoms of infection. There is minimal swelling.  A small effusion is present. Palpation of the knee reveals mild tenderness. There is no calf tenderness with palpation. Negative Homan's sign.    Range of motion is 0? extension to 110? flexion with no pain. Knee is unstable with +2 laxity with varus/valgus stress at 0,30, and 90 degree. Normal patellar tracking. Muscle Strength of the knee is 5/5 with respect to flexors and extensors.     Special tests:   Anterior drawer test:  []  Positive [x]  Negative []  Not Tested  Posterior drawer test:  []  Positive [x]  Negative []  Not Tested  Patellar apprehension test: []  Positive [x]  Negative []  Not Tested  Patellar grind test:  []  Positive [x]  Negative []  Not Tested      RADIOGRAPHS: Not obtained today. Previous radiographs were reviewed in office.      MRI: MRI with MARS of the left knee  performed at [facility] on April 08, 2021 was reviewed in clinic today. Study shows:  Postoperative changes from total knee arthroplasty again noted. Despite WARP Sequencing   Assessment of the bone-in the interface is compromised. There is again identified extensive intraosseous cystic change within the posterior lateral posterior medial femoral condyles the implant similar to the prior study, again concerning for periprosthetic erosive change, particle disease and/or reactive synovitis.    Large cyst measuring 5 cm in greatest length within the deep subcutaneous tissues along the lateral joint line with the minimally larger than the prior study. The second smaller posterior lateral cyst is no longer evident.     IMPRESSION:  1.  Status post left total knee arthroplasty  2.  Status-post open left knee ganglion cyst removal  3.  5 x 5 cm cystic mass on lateral aspect of left  knee- improving.     PLAN: The patient is doing well and is asymptomatic with his cyst or pain at this time.  Unfortunately, the patient's cyst has returned in size.  He does feel 80% improved as compared to before his surgery including his cyst has remained small. The patient is doing well. At this time we will continue with observation. The patient was encouraged to continue with their exercises at home. All of the patient's questions and concerns were addressed.        FOLLOW-UP: Return to clinic in 1 month for re-evaluation and new radiographs.     Thank you for allowing me to participate in Daryl Graves?s care. Please do not hesitate to contact me if you have any questions or concerns.         Leane Call, D.O.  Adult Reconstruction Specialist  ORTHOPEDIC SURGERY  07/01/2021            Susa Simmonds, MD

## 2021-09-06 ENCOUNTER — Other Ambulatory Visit: Payer: Self-pay | Admitting: Family Medicine

## 2021-09-06 DIAGNOSIS — I10 Essential (primary) hypertension: Secondary | ICD-10-CM

## 2021-09-06 DIAGNOSIS — E790 Hyperuricemia without signs of inflammatory arthritis and tophaceous disease: Secondary | ICD-10-CM

## 2021-09-06 DIAGNOSIS — E782 Mixed hyperlipidemia: Secondary | ICD-10-CM

## 2021-09-06 DIAGNOSIS — Z125 Encounter for screening for malignant neoplasm of prostate: Secondary | ICD-10-CM

## 2021-09-14 ENCOUNTER — Other Ambulatory Visit: Payer: PPO

## 2021-09-21 ENCOUNTER — Encounter: Payer: PPO | Admitting: Family Medicine

## 2021-09-21 ENCOUNTER — Encounter: Payer: Self-pay | Admitting: Family Medicine

## 2021-09-21 ENCOUNTER — Ambulatory Visit (INDEPENDENT_AMBULATORY_CARE_PROVIDER_SITE_OTHER): Payer: PPO | Admitting: Family Medicine

## 2021-09-21 ENCOUNTER — Other Ambulatory Visit: Payer: Self-pay

## 2021-09-21 VITALS — BP 122/82 | HR 88 | Temp 97.9°F | Ht 72.0 in | Wt 213.0 lb

## 2021-09-21 DIAGNOSIS — E790 Hyperuricemia without signs of inflammatory arthritis and tophaceous disease: Secondary | ICD-10-CM

## 2021-09-21 DIAGNOSIS — Z7189 Other specified counseling: Secondary | ICD-10-CM

## 2021-09-21 DIAGNOSIS — Z125 Encounter for screening for malignant neoplasm of prostate: Secondary | ICD-10-CM

## 2021-09-21 DIAGNOSIS — E782 Mixed hyperlipidemia: Secondary | ICD-10-CM | POA: Diagnosis not present

## 2021-09-21 DIAGNOSIS — Z0000 Encounter for general adult medical examination without abnormal findings: Secondary | ICD-10-CM | POA: Diagnosis not present

## 2021-09-21 DIAGNOSIS — F339 Major depressive disorder, recurrent, unspecified: Secondary | ICD-10-CM | POA: Diagnosis not present

## 2021-09-21 DIAGNOSIS — H6980 Other specified disorders of Eustachian tube, unspecified ear: Secondary | ICD-10-CM | POA: Diagnosis not present

## 2021-09-21 DIAGNOSIS — Z1211 Encounter for screening for malignant neoplasm of colon: Secondary | ICD-10-CM

## 2021-09-21 DIAGNOSIS — I10 Essential (primary) hypertension: Secondary | ICD-10-CM | POA: Diagnosis not present

## 2021-09-21 DIAGNOSIS — J309 Allergic rhinitis, unspecified: Secondary | ICD-10-CM

## 2021-09-21 LAB — COMPREHENSIVE METABOLIC PANEL
ALT: 24 U/L (ref 0–53)
AST: 23 U/L (ref 0–37)
Albumin: 4.6 g/dL (ref 3.5–5.2)
Alkaline Phosphatase: 81 U/L (ref 39–117)
BUN: 14 mg/dL (ref 6–23)
CO2: 28 mEq/L (ref 19–32)
Calcium: 10.2 mg/dL (ref 8.4–10.5)
Chloride: 98 mEq/L (ref 96–112)
Creatinine, Ser: 1.16 mg/dL (ref 0.40–1.50)
GFR: 63.94 mL/min (ref >60.00)
Glucose, Bld: 91 mg/dL (ref 70–99)
Potassium: 4.9 mEq/L (ref 3.5–5.1)
Sodium: 137 mEq/L (ref 135–145)
Total Bilirubin: 0.6 mg/dL (ref 0.2–1.2)
Total Protein: 7.5 g/dL (ref 6.0–8.3)

## 2021-09-21 LAB — LIPID PANEL
Cholesterol: 168 mg/dL (ref 0–200)
HDL: 34.6 mg/dL — ABNORMAL LOW (ref >39.00)
NonHDL: 133.56
Total CHOL/HDL Ratio: 5
Triglycerides: 304 mg/dL — ABNORMAL HIGH (ref 0.0–149.0)
VLDL: 60.8 mg/dL — ABNORMAL HIGH (ref 0.0–40.0)

## 2021-09-21 LAB — LDL CHOLESTEROL, DIRECT: Direct LDL: 97 mg/dL

## 2021-09-21 LAB — PSA, MEDICARE: PSA: 0 ng/ml — ABNORMAL LOW (ref 0.10–4.00)

## 2021-09-21 LAB — URIC ACID: Uric Acid, Serum: 8.5 mg/dL — ABNORMAL HIGH (ref 4.0–7.8)

## 2021-09-21 MED ORDER — OMEPRAZOLE 20 MG PO CPDR: 20.0000 mg | DELAYED_RELEASE_CAPSULE | Freq: Every day | ORAL | 3 refills | Status: DC | PRN

## 2021-09-21 MED ORDER — FLUTICASONE PROPIONATE 50 MCG/ACT NA SUSP: NASAL | 3 refills | Status: DC

## 2021-09-21 MED ORDER — COLCHICINE 0.6 MG PO TABS: 0.6000 mg | ORAL_TABLET | Freq: Every day | ORAL | 1 refills | Status: DC | PRN

## 2021-09-21 MED ORDER — TAMSULOSIN HCL 0.4 MG PO CAPS: 0.4000 mg | ORAL_CAPSULE | Freq: Every day | ORAL | 3 refills | Status: DC

## 2021-09-21 MED ORDER — SILDENAFIL CITRATE 20 MG PO TABS: 20.0000 mg | ORAL_TABLET | Freq: Every day | ORAL | 12 refills | Status: DC | PRN

## 2021-09-21 MED ORDER — LISINOPRIL 30 MG PO TABS: 30.0000 mg | ORAL_TABLET | Freq: Every day | ORAL | 3 refills | Status: DC

## 2021-09-21 MED ORDER — ROSUVASTATIN CALCIUM 10 MG PO TABS: ORAL_TABLET | ORAL | 3 refills | Status: DC

## 2021-09-21 MED ORDER — VENLAFAXINE HCL ER 75 MG PO CP24: 150.0000 mg | ORAL_CAPSULE | Freq: Every day | ORAL | 3 refills | Status: DC

## 2021-09-21 NOTE — Progress Notes (Signed)
This visit occurred during the SARS-CoV-2 public health emergency.  Safety protocols were in place, including screening questions prior to the visit, additional usage of staff PPE, and extensive cleaning of exam room while observing appropriate contact time as indicated for disinfecting solutions.  I have personally reviewed the Medicare Annual Wellness questionnaire and have noted 1. The patient's medical and social history 2. Their use of alcohol, tobacco or illicit drugs 3. Their current medications and supplements 4. The patient's functional ability including ADL's, fall risks, home safety risks and hearing or visual             impairment. 5. Diet and physical activities 6. Evidence for depression or mood disorders  The patients weight, height, BMI have been recorded in the chart and visual acuity is per eye clinic.  I have made referrals, counseling and provided education to the patient based review of the above and I have provided the pt with a written personalized care plan for preventive services.  Provider list updated- see scanned forms.  Routine anticipatory guidance given to patient.  See health maintenance. The possibility exists that previously documented standard health maintenance information may have been brought forward from a previous encounter into this note.  If needed, that same information has been updated to reflect the current situation based on today's encounter.    Flu 2022 Shingles previously done PNA previously done Tetanus 2010, discussed with patient COVID-vaccine previously done. Colonoscopy due 2023, he is going to f/u with Dr. Bary Castilla.   Prostate cancer screening pending 2023 Advance directive-wife designated if patient were incapacitated. Cognitive function addressed- see scanned forms- and if abnormal then additional documentation follows.   In addition to [facility], follow up visit for the below conditions:  Mood d/w pt.  His daughter is still  dealing with cancer recurrence.  She had surgery recently.  His wife has vision loss due to glaucoma.  He has a lot going on.  Condolences offered.  He has semi retired and that is an adjustment for patient.  He continues on Effexor without adverse effect and likely with benefit.  No recent colchicine use.  Statin cautions d/w pt.   Rare use of PPI, usually taking TUMS prn.    He is going to f/u with derm clinic soon.    Hypertension:    Using medication without problems or lightheadedness: yes Chest pain with exertion:no Edema:no Short of breath:no  He has occ vertigo with ETD.  He uses valsalva and that helps.  He is using flonase w/o much relief.  He is episodically taking zyrtec.  See avs.    Elevated Cholesterol: Using medications without problems: yes Muscle aches: no Diet compliance: d/w pt.   Exercise: d/w pt.   Labs pending.    PMH and SH reviewed  Meds, vitals, and allergies reviewed.   ROS: Per HPI.  Unless specifically indicated otherwise in HPI, the patient denies:  General: fever. Eyes: acute vision changes ENT: sore throat Cardiovascular: chest pain Respiratory: SOB GI: vomiting GU: dysuria Musculoskeletal: acute back pain Derm: acute rash Neuro: acute motor dysfunction Psych: worsening mood Endocrine: polydipsia Heme: bleeding Allergy: hayfever  GEN: nad, alert and oriented HEENT: ncat, TM w/o erythema bilaterally. NECK: supple w/o LA CV: rrr. PULM: ctab, no inc wob ABD: soft, +bs EXT: no edema SKIN: well perfused.

## 2021-09-21 NOTE — Patient Instructions (Addendum)
Keep taking flonase and add on zyrtec.  If not any better, then let me know.  We can set you up with ENT if needed.   Take care.  Glad to see you. Go to the lab on the way out.   If you have mychart we'll likely use that to update you.

## 2021-09-23 NOTE — Assessment & Plan Note (Signed)
Support offered.  Still okay for outpatient follow-up.  Would continue Effexor as is.  He will update me as needed.

## 2021-09-23 NOTE — Assessment & Plan Note (Signed)
Flu 2022 Shingles previously done PNA previously done Tetanus 2010, discussed with patient COVID-vaccine previously done. Colonoscopy due 2023, he is going to f/u with Dr. Bary Castilla.   Prostate cancer screening pending 2023 Advance directive-wife designated if patient were incapacitated. Cognitive function addressed- see scanned forms- and if abnormal then additional documentation follows.

## 2021-09-23 NOTE — Assessment & Plan Note (Signed)
See notes on labs.  Continue Crestor.

## 2021-09-23 NOTE — Assessment & Plan Note (Signed)
See notes on labs.  Continue lisinopril.

## 2021-09-23 NOTE — Assessment & Plan Note (Signed)
He has occasional vertigo associated eustachian tube dysfunction.  He uses Valsalva that helps.  He is already using Flonase.  We talked about using Zyrtec on a scheduled basis and he will update me if not improved.  We can refer to ENT if needed.  He agrees with plan.

## 2021-09-23 NOTE — Assessment & Plan Note (Signed)
No recent gout symptoms or colchicine use.  Routine statin/colchicine cautions discussed with patient.

## 2021-09-23 NOTE — Assessment & Plan Note (Signed)
Advance directive- wife designated if patient were incapacitated.  

## 2021-09-25 ENCOUNTER — Encounter: Payer: Self-pay | Admitting: *Deleted

## 2021-09-27 ENCOUNTER — Other Ambulatory Visit: Payer: Self-pay | Admitting: Family Medicine

## 2021-09-27 DIAGNOSIS — E782 Mixed hyperlipidemia: Secondary | ICD-10-CM

## 2021-10-01 ENCOUNTER — Ambulatory Visit: Payer: PRIVATE HEALTH INSURANCE | Attending: Medical

## 2021-10-01 DIAGNOSIS — G8929 Other chronic pain: Secondary | ICD-10-CM

## 2021-10-01 DIAGNOSIS — Z0189 Encounter for other specified special examinations: Secondary | ICD-10-CM

## 2021-10-01 DIAGNOSIS — M25562 Pain in left knee: Secondary | ICD-10-CM

## 2021-10-01 DIAGNOSIS — M6740 Ganglion, unspecified site: Secondary | ICD-10-CM

## 2021-10-01 MED ORDER — CELECOXIB 200 MG PO CAPS
200 mg | ORAL_CAPSULE | Freq: Two times a day (BID) | ORAL | 2 refills | Status: AC
Start: 2021-10-01 — End: ?

## 2021-10-01 NOTE — Progress Notes
Date: 10/01/2021  Name: Daryl Graves   MRN#: [id number]   DOB: Apr 15, 1951   Age: 71 y.o.      Dr. Leane Call, DO  Thurmon Fair PA-C    The patient was seen at the [facility] office.    CHIEF COMPLAINT: Status-post open left knee ganglion cyst removal    INTERIM HISTORY: Daryl Graves presents 9 months post-operatively from a open left knee ganglion cyst removal. The patient's pain level today is mild in severity and is dull in quality. The patient denies fevers, chills or systemic complaints. he denies any feelings of mechanical locking or catching. the patient notes that discomfort and pain over the lateral aspect of his knee around the cyst has returned.  The pain is mild-to-moderate.  He states that the size of swelling and pain feels just a similar as it did prior to the surgery.    PHYSICAL EXAM:  Constitutional: Daryl Graves is a 70 y.o. male in no acute distress.   Vitals:    07/01/21 0842   Weight: (!) 259 lb 14.8 oz (117.9 kg)   Height: 6' 1'' (1.854 m)    Body mass index is 34.29 kg/m?Marland Kitchen  Psyc: he is alert and oriented x3 with a normal mood and affect.   HENT: AT/NC; hearing intact  Eyes: PERRLA. Extra-ocular movements are intact.  Skin: Normal temperature. There are no rashes, ulcerations, or lesions.  Lymphatic: No induration or enlargement.  Extremities: No swelling or calf tenderness. No clubbing. No cyanosis.  Gait:  []  Antalgic [x]  Normal []  Trendelenburg   Assistive devices: none    Left knee: Healed incision with 5 x 5 cm cystic mass is palpable. There is no drainage, bruising, or ecchymosis. No signs or symptoms of infection. There is no knee joint swelling.  A no knee joint effusion is present. Palpation of the knee reveals no tenderness. There is no calf tenderness with palpation. Negative Homan's sign.    Range of motion is 0? extension to 110? flexion with no pain. Knee is unstable with +2 laxity with varus/valgus stress at 0,30, and 90 degree. Normal patellar tracking. Muscle Strength of the knee is 5/5 with respect to flexors and extensors.     Special tests:   Anterior drawer test:  []  Positive [x]  Negative []  Not Tested  Posterior drawer test:  []  Positive [x]  Negative []  Not Tested  Patellar apprehension test: []  Positive [x]  Negative []  Not Tested  Patellar grind test:  []  Positive [x]  Negative []  Not Tested      RADIOGRAPHS: Three views of the left knee and 3 view(s) comparison of the opposite knee were ordered, obtained and reviewed by me here at [facility] today and reviewed with the patient. Radiographs demonstrate left TKA knee in good position and alignment.  There is optimal implant cement-bone interface without radiolucencies. There is no evidence of loosening or other abnormalities. The patella is well located.  Right total knee arthroplasty appears to be in good position and alignment.  There is no surrounding radiolucencies of either knee replacement.    MRI: MRI with MARS of the left knee  performed at [facility] on April 08, 2021 was reviewed in clinic today. Study shows:    Postoperative changes from total knee arthroplasty again noted. Despite WARP Sequencing   Assessment of the bone-in the interface is compromised. There is again identified extensive intraosseous cystic change within the posterior lateral posterior medial femoral condyles the implant similar to the prior study, again  concerning for periprosthetic erosive change, particle disease and/or reactive synovitis.    Large cyst measuring 5 cm in greatest length within the deep subcutaneous tissues along the lateral joint line with the minimally larger than the prior study. The second smaller posterior lateral cyst is no longer evident.     IMPRESSION:  1.  Status post left total knee arthroplasty  2.  Status-post open left knee ganglion cyst removal  3.  5 x 5 cm cystic mass on lateral aspect of left knee    PLAN: The patient now has had this similar return of swelling within the ganglion cyst of his left knee. the aspiration and injection of corticosteroid at the last visit provided good diminishment of the swelling but lasted for only less than 2 months.  Overall he feels that his discomfort and swelling is similar as to what it was prior to surgery.  His radiographs are benign today.    The patient has failed multiple aspiration and corticosteroid injections, surgical cyst removal and still the swelling persists.    Overall the patient is functional with his activities of daily living.  Celebrex 200 mg twice daily was recommended to further improve his pain and swelling, the risks and benefits of medication were discussed today including GI side effects and kidney side effects.      The patient was recommended to follow up with Dr. Nicolasa Ducking for further consideration of treatment options.  All of the patient's questions and concerns were addressed.        FOLLOW-UP: Return to clinic in 6 weeks    Thank you for allowing me to participate in Daryl Graves?s care. Please do not hesitate to contact me if you have any questions or concerns.      The patient was examined and evaluated by Thurmon Fair PA-C for Dr. Nicolasa Ducking, D.O.      Olegario Shearer D. Otilio Miu, PA-C  Physician assistant for Dr. Nicolasa Ducking, D.O.  [facility]  10/01/2021           Susa Simmonds, MD

## 2021-10-19 ENCOUNTER — Ambulatory Visit: Payer: PPO | Admitting: Dermatology

## 2021-10-19 ENCOUNTER — Other Ambulatory Visit: Payer: Self-pay

## 2021-10-19 DIAGNOSIS — L570 Actinic keratosis: Secondary | ICD-10-CM

## 2021-10-19 DIAGNOSIS — L820 Inflamed seborrheic keratosis: Secondary | ICD-10-CM | POA: Diagnosis not present

## 2021-10-19 DIAGNOSIS — T148XXA Other injury of unspecified body region, initial encounter: Secondary | ICD-10-CM

## 2021-10-19 DIAGNOSIS — S80811A Abrasion, right lower leg, initial encounter: Secondary | ICD-10-CM | POA: Diagnosis not present

## 2021-10-19 DIAGNOSIS — L821 Other seborrheic keratosis: Secondary | ICD-10-CM | POA: Diagnosis not present

## 2021-10-19 DIAGNOSIS — L578 Other skin changes due to chronic exposure to nonionizing radiation: Secondary | ICD-10-CM

## 2021-10-19 NOTE — Progress Notes (Signed)
° °  Follow-Up Visit   Subjective  Ryan Blevins is a 71 y.o. male who presents for the following: Actinic Keratosis (Recheck the face and ears for new or persistent skin lesions. ). The patient has spots, moles and lesions to be evaluated, some may be new or changing.  The following portions of the chart were reviewed this encounter and updated as appropriate:   Tobacco   Allergies   Meds   Problems   Med Hx   Surg Hx   Fam Hx      Review of Systems:  No other skin or systemic complaints except as noted in HPI or Assessment and Plan.  Objective  Well appearing patient in no apparent distress; mood and affect are within normal limits.  A focused examination was performed including the face, ears, hands and R leg. Relevant physical exam findings are noted in the Assessment and Plan.  Forehead x 5 (5) Erythematous thin papules/macules with gritty scale.   L scalp x 1, L hand x 1 (2) Erythematous stuck-on, waxy papule or plaque  R lower leg Excoriation with crust.   Assessment & Plan  AK (actinic keratosis) (5) Forehead x 5  Destruction of lesion - Forehead x 5 Complexity: simple   Destruction method: cryotherapy   Informed consent: discussed and consent obtained   Timeout:  patient name, date of birth, surgical site, and procedure verified Lesion destroyed using liquid nitrogen: Yes   Region frozen until ice ball extended beyond lesion: Yes   Outcome: patient tolerated procedure well with no complications   Post-procedure details: wound care instructions given    Inflamed seborrheic keratosis (2) L scalp x 1, L hand x 1  Destruction of lesion - L scalp x 1, L hand x 1 Complexity: simple   Destruction method: cryotherapy   Informed consent: discussed and consent obtained   Timeout:  patient name, date of birth, surgical site, and procedure verified Lesion destroyed using liquid nitrogen: Yes   Region frozen until ice ball extended beyond lesion: Yes   Outcome: patient  tolerated procedure well with no complications   Post-procedure details: wound care instructions given    Excoriation R lower leg Benign-appearing.  Observation.  Call clinic for new or changing lesions.  Recommend daily use of broad spectrum spf 30+ sunscreen to sun-exposed areas.   Seborrheic Keratoses - Stuck-on, waxy, tan-brown papules and/or plaques  - Benign-appearing - Discussed benign etiology and prognosis. - Observe - Call for any changes  Actinic Damage - chronic, secondary to cumulative UV radiation exposure/sun exposure over time - diffuse scaly erythematous macules with underlying dyspigmentation - Recommend daily broad spectrum sunscreen SPF 30+ to sun-exposed areas, reapply every 2 hours as needed.  - Recommend staying in the shade or wearing long sleeves, sun glasses (UVA+UVB protection) and wide brim hats (4-inch brim around the entire circumference of the hat). - Call for new or changing lesions.  Return in about 6 months (around 04/18/2022) for TBSE.  Luther Redo, CMA, am acting as scribe for Sarina Ser, MD . Documentation: I have reviewed the above documentation for accuracy and completeness, and I agree with the above.  Sarina Ser, MD

## 2021-10-19 NOTE — Patient Instructions (Signed)

## 2021-10-20 ENCOUNTER — Encounter: Payer: Self-pay | Admitting: Dermatology

## 2021-10-28 ENCOUNTER — Other Ambulatory Visit: Payer: Self-pay | Admitting: General Surgery

## 2021-10-28 DIAGNOSIS — Z86010 Personal history of colonic polyps: Secondary | ICD-10-CM | POA: Diagnosis not present

## 2021-10-28 NOTE — Progress Notes (Signed)
Subjective:     Patient ID: Ryan Blevins is a 71 y.o. male.   HPI   The following portions of the patient's history were reviewed and updated as appropriate.   This an established patient is here today for: office visit. Here to discuss having a colonoscopy, last completed 10-24-18. Patient states he has a history of colon polyps. The patient reports he has bowel movements once per day. He reports if his stools are solid he does have bright red blood.    He states his bowels move regular, no bleeding.        Chief Complaint  Patient presents with   Pre-op Exam      BP (!) 146/76    Pulse 82    Temp 36.4 C (97.6 F)    Ht 182.9 cm (6')    Wt 95.7 kg (211 lb)    SpO2 98%    BMI 28.62 kg/m        Past Medical History:  Diagnosis Date   Actinic keratitis 04/18/2018   Basal cell carcinoma 11/09/2020    2011, 2014   Depression     HLD (hyperlipidemia)     HTN (hypertension)     Hyperuricemia     Low testosterone     Prostate cancer (CMS-HCC) 2011   Syncope             Past Surgical History:  Procedure Laterality Date   COLONOSCOPY   02/20/2009   COLONOSCOPY   10/24/2018    Dr Bary Castilla          Social History          Socioeconomic History   Marital status: Married  Tobacco Use   Smoking status: Never   Smokeless tobacco: Never  Substance and Sexual Activity   Alcohol use: Never   Drug use: Never            Allergies  Allergen Reactions   Atorvastatin Other (See Comments)   Bupropion Other (See Comments)   Codeine Other (See Comments)      Current Medications        Current Outpatient Medications  Medication Sig Dispense Refill   cetirizine (ZYRTEC) 10 MG tablet Take 10 mg by mouth once daily       colchicine (COLCRYS) 0.6 mg tablet Take 0.6 mg by mouth as needed       fluticasone propionate (FLONASE) 50 mcg/actuation nasal spray SHAKE LIQUID AND USE 2 SPRAYS IN EACH NOSTRIL DAILY       lisinopriL (ZESTRIL) 30 MG tablet Take 30 mg by mouth once  daily       omeprazole (PRILOSEC) 20 MG DR capsule Take by mouth       rosuvastatin (CRESTOR) 10 MG tablet Take 10 mg by mouth once daily       sildenafil (REVATIO) 20 mg tablet Take by mouth       tamsulosin (FLOMAX) 0.4 mg capsule Take 0.4 mg by mouth once daily       venlafaxine (EFFEXOR-XR) 75 MG XR capsule Take 150 mg by mouth once daily        No current facility-administered medications for this visit.             Family History  Problem Relation Age of Onset   Stroke Mother     Heart disease Mother     Osteoporosis (Thinning of bones) Mother     Diabetes Father     Hyperlipidemia (Elevated cholesterol) Father  High blood pressure (Hypertension) Father     Parkinsonism Father     Colon cancer Neg Hx          Labs and Radiology:    Endoscopy report October 24, 2018:   A 15 mm polyp was found in the transverse colon proximal transverse colon. The polyp was pedunculated. The polyp was removed with a hot snare. Resection and retrieval were complete. Findings: A 6 mm polyp was found in the rectum. The polyp was sessile. The polyp was removed with a hot snare. Resection and retrieval were complete.   DIAGNOSIS:  A.  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  - SMALLER FRAGMENTS ARE ALSO PARTS OF THE ADENOMA  - NEGATIVE FOR HIGH-GRADE DYSPLASIA AND MALIGNANCY.   B.  RECTUM POLYPS X 2; HOT SNARE:  - HYPERPLASTIC POLYP, 1 FRAGMENT, AND FECAL MATERIAL. NO OTHER TISSUE  FOUND IN CONTAINER.  - NEGATIVE FOR DYSPLASIA AND MALIGNANCY.        Review of Systems  Constitutional: Negative for chills and fever.  Respiratory: Negative for cough.          Objective:   Physical Exam Constitutional:      Appearance: Normal appearance.  Cardiovascular:     Rate and Rhythm: Normal rate and regular rhythm.     Pulses: Normal pulses.     Heart sounds: Normal heart sounds.  Pulmonary:     Effort:  Pulmonary effort is normal.     Breath sounds: Normal breath sounds.  Musculoskeletal:     Cervical back: Neck supple.  Skin:    General: Skin is warm and dry.  Neurological:     Mental Status: He is alert and oriented to person, place, and time.  Psychiatric:        Mood and Affect: Mood normal.        Behavior: Behavior normal.           Assessment:     Candidate for repeat colonoscopy based on identification of a sessile serrated adenoma on his 2020 exam.    Plan:     Reviewed prep and indications for procedure as well as risks.    This note is partially prepared by Karie Fetch, RN, acting as a scribe in the presence of Dr. Hervey Ard, MD.    The documentation recorded by the scribe accurately reflects the service I personally performed and the decisions made by me.    [name], MD FACS

## 2021-11-09 ENCOUNTER — Encounter: Payer: Self-pay | Admitting: General Surgery

## 2021-11-10 ENCOUNTER — Ambulatory Visit
Admission: RE | Admit: 2021-11-10 | Discharge: 2021-11-10 | Disposition: A | Discharge status: Home / Self Care | Payer: PPO | Attending: General Surgery | Admitting: General Surgery

## 2021-11-10 ENCOUNTER — Encounter: Payer: Self-pay | Admitting: General Surgery

## 2021-11-10 ENCOUNTER — Ambulatory Visit: Payer: PPO

## 2021-11-10 ENCOUNTER — Encounter
Admission: RE | Disposition: A | Discharge status: Home / Self Care | Payer: Self-pay | Source: Home / Self Care | Attending: General Surgery

## 2021-11-10 DIAGNOSIS — I10 Essential (primary) hypertension: Secondary | ICD-10-CM | POA: Insufficient documentation

## 2021-11-10 DIAGNOSIS — Z85828 Personal history of other malignant neoplasm of skin: Secondary | ICD-10-CM | POA: Diagnosis not present

## 2021-11-10 DIAGNOSIS — Z1211 Encounter for screening for malignant neoplasm of colon: Secondary | ICD-10-CM | POA: Insufficient documentation

## 2021-11-10 DIAGNOSIS — Z8546 Personal history of malignant neoplasm of prostate: Secondary | ICD-10-CM | POA: Diagnosis not present

## 2021-11-10 DIAGNOSIS — E785 Hyperlipidemia, unspecified: Secondary | ICD-10-CM | POA: Diagnosis not present

## 2021-11-10 DIAGNOSIS — Z86010 Personal history of colonic polyps: Secondary | ICD-10-CM | POA: Insufficient documentation

## 2021-11-10 DIAGNOSIS — F32A Depression, unspecified: Secondary | ICD-10-CM | POA: Diagnosis not present

## 2021-11-10 HISTORY — PX: COLONOSCOPY WITH PROPOFOL: SHX5780

## 2021-11-10 SURGERY — COLONOSCOPY WITH PROPOFOL
Anesthesia: General

## 2021-11-10 MED ORDER — PROPOFOL 10 MG/ML IV BOLUS
INTRAVENOUS | Status: DC | PRN
  Administered 2021-11-10: 80 mg via INTRAVENOUS

## 2021-11-10 MED ORDER — LIDOCAINE HCL (CARDIAC) PF 100 MG/5ML IV SOSY
PREFILLED_SYRINGE | INTRAVENOUS | Status: DC | PRN
  Administered 2021-11-10: 40 mg via INTRAVENOUS

## 2021-11-10 MED ORDER — SODIUM CHLORIDE 0.9 % IV SOLN
INTRAVENOUS | Status: DC
  Administered 2021-11-10: 1000 mL via INTRAVENOUS

## 2021-11-10 MED ORDER — PROPOFOL 500 MG/50ML IV EMUL
INTRAVENOUS | Status: DC | PRN
  Administered 2021-11-10: 150 ug/kg/min via INTRAVENOUS

## 2021-11-10 NOTE — Anesthesia Preprocedure Evaluation (Signed)
Anesthesia Evaluation  Patient identified by MRN, date of birth, ID band Patient awake    Reviewed: Allergy & Precautions, H&P , NPO status , Patient's Chart, lab work & pertinent test results, reviewed documented beta blocker date and time   Airway Mallampati: II   Neck ROM: full    Dental  (+) Poor Dentition   Pulmonary neg pulmonary ROS,    Pulmonary exam normal        Cardiovascular Exercise Tolerance: Good hypertension, negative cardio ROS Normal cardiovascular exam Rhythm:regular Rate:Normal     Neuro/Psych negative neurological ROS  negative psych ROS   GI/Hepatic negative GI ROS, Neg liver ROS,   Endo/Other  negative endocrine ROS  Renal/GU negative Renal ROS  negative genitourinary   Musculoskeletal   Abdominal   Peds  Hematology negative hematology ROS (+)   Anesthesia Other Findings Past Medical History: 04/18/2018: Actinic keratosis     Comment:  Left nasal ala. Hypertrophic 02/25/2010: Basal cell carcinoma     Comment:  Left med. pretibial. Superficial with focal               infiltration. 04/29/2013: Basal cell carcinoma     Comment:  Right superior helix. Excised: 06/12/2013 11/09/2020: Basal cell carcinoma     Comment:  left medial upper eyelid - MOHs No date: Depression 08/19/2015: Dysplastic nevus     Comment:  Left med medial calf. DN with severe atypia, lateral               margin involved. Excised: 09/01/2015, margins free. No date: Hemorrhoid No date: Hyperlipidemia No date: Hypertension No date: Hyperuricemia 2011: Low testosterone 2011: Prostate cancer ([place])     Comment:  per Dr. Risa Grill.  Seed implant per Dr. Valere Dross No date: Skin cancer No date: Syncope Past Surgical History: 02/20/2009: COLONOSCOPY     Comment:  Dr Bary Castilla 10/24/2018: COLONOSCOPY WITH PROPOFOL; N/A     Comment:  Procedure: COLONOSCOPY WITH PROPOFOL;  Surgeon: [name], MD;  Location:  [facility] END[facility];  Service:               Endoscopy;  Laterality: N/A; BMI    Body Mass Index: 28.54 kg/m     Reproductive/Obstetrics negative OB ROS                             Anesthesia Physical Anesthesia Plan  ASA: 3  Anesthesia Plan: General   Post-op Pain Management:    Induction:   PONV Risk Score and Plan:   Airway Management Planned:   Additional Equipment:   Intra-op Plan:   Post-operative Plan:   Informed Consent: I have reviewed the patients History and Physical, chart, labs and discussed the procedure including the risks, benefits and alternatives for the proposed anesthesia with the patient or authorized representative who has indicated his/her understanding and acceptance.     Dental Advisory Given  Plan Discussed with: CRNA  Anesthesia Plan Comments:         Anesthesia Quick Evaluation

## 2021-11-10 NOTE — H&P (Signed)
Ryan Blevins [id number] 1951-06-17     HPI: Past history SSA of proximal transverse colon in 2020. For follow up endoscopy.   Medications Prior to Admission  Medication Sig Dispense Refill Last Dose   colchicine 0.6 MG tablet Take 1 tablet (0.6 mg total) by mouth daily as needed. 30 tablet 1 Past Week   lisinopril (ZESTRIL) 30 MG tablet Take 1 tablet (30 mg total) by mouth daily. 90 tablet 3 11/09/2021   omeprazole (PRILOSEC) 20 MG capsule Take 1 capsule (20 mg total) by mouth daily as needed. 90 capsule 3 11/09/2021   rosuvastatin (CRESTOR) 10 MG tablet 1 tab daily. 90 tablet 3 11/09/2021   tamsulosin (FLOMAX) 0.4 MG CAPS capsule Take 1 capsule (0.4 mg total) by mouth daily. 90 capsule 3 11/09/2021   venlafaxine XR (EFFEXOR-XR) 75 MG 24 hr capsule Take 2 capsules (150 mg total) by mouth daily. 180 capsule 3 11/09/2021   cetirizine (ZYRTEC) 10 MG tablet Take 10 mg by mouth daily.    at prn   fluticasone (FLONASE) 50 MCG/ACT nasal spray SHAKE LIQUID AND USE 2 SPRAYS IN EACH NOSTRIL DAILY 48 g 3  at prn   sildenafil (REVATIO) 20 MG tablet Take 1-3 tablets (20-60 mg total) by mouth daily as needed. 50 tablet 12  at prn   Allergies  Allergen Reactions   Atorvastatin     REACTION: but tolerant of crestor- this is the only statin he tolerates   Bupropion Hcl     REACTION: mood changes   Codeine    Past Medical History:  Diagnosis Date   Actinic keratosis 04/18/2018   Left nasal ala. Hypertrophic   Basal cell carcinoma 02/25/2010   Left med. pretibial. Superficial with focal infiltration.   Basal cell carcinoma 04/29/2013   Right superior helix. Excised: 06/12/2013   Basal cell carcinoma 11/09/2020   left medial upper eyelid - MOHs   Depression    Dysplastic nevus 08/19/2015   Left med medial calf. DN with severe atypia, lateral margin involved. Excised: 09/01/2015, margins free.   Hemorrhoid    Hyperlipidemia    Hypertension    Hyperuricemia    Low testosterone 2011   Prostate  cancer ([place]) 2011   per Dr. Risa Grill.  Seed implant per Dr. 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  [employer] grad, doctorate in Counseling, [place]. Worked in [employer].   [employer]   Works in [employer] 2012, in relationship  for 20+ years   Social Determinants of Radio broadcast assistant Strain: Not on file  Food Insecurity: Not on file  Transportation Needs: Not on file  Physical Activity: Not on file  Stress: Not on file  Social Connections: Not on file  Intimate Partner Violence: Not on file   Social History   Social History Narrative   From [place].  [employer] grad, doctorate in Counseling, [place]. Worked in [employer].   [employer]   Works in [employer] 2012, in relationship for 20+ years     ROS: Negative.   2020 endoscopy pathology:  DIAGNOSIS:  A.  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  - SMALLER FRAGMENTS ARE ALSO PARTS OF THE ADENOMA  - NEGATIVE FOR HIGH-GRADE DYSPLASIA AND MALIGNANCY.   B.  RECTUM POLYPS X 2;  HOT SNARE:  - HYPERPLASTIC POLYP, 1 FRAGMENT, AND FECAL MATERIAL. NO OTHER TISSUE  FOUND IN CONTAINER.  - NEGATIVE FOR DYSPLASIA AND MALIGNANCY.   PE: HEENT: Negative. Lungs: Clear. Cardio: RR.  Assessment/Plan:  Proceed with planned endoscopy.    Forest Gleason [facility] 11/10/2021

## 2021-11-10 NOTE — Transfer of Care (Signed)
Immediate Anesthesia Transfer of Care Note  Patient: Ryan Blevins  Procedure(s) Performed: COLONOSCOPY WITH PROPOFOL  Patient Location: PACU and Endoscopy Unit  Anesthesia Type:General  Level of Consciousness: drowsy  Airway & Oxygen Therapy: Patient Spontanous Breathing  Post-op Assessment: Report given to RN and Post -op Vital signs reviewed and stable  Post vital signs: Reviewed and stable  Last Vitals:  Vitals Value Taken Time  BP 132/73 11/10/21 1008  Temp    Pulse 77 11/10/21 1008  Resp 16 11/10/21 1008  SpO2 95 % 11/10/21 1008  Vitals shown include unvalidated device data.  Last Pain:  Vitals:   11/10/21 1008  TempSrc:   PainSc: 0-No pain         Complications: No notable events documented.

## 2021-11-10 NOTE — Op Note (Signed)
[facility] Gastroenterology Patient Name: Mariano Doshi Procedure Date: 11/10/2021 9:36 AM MRN: [id number] Account #: [id number] Date of Birth: [birth year] Admit Type: Outpatient Age: 71 Room: [facility] ENDO ROOM 1 Gender: Male Note Status: Finalized Instrument Name: Peds Colonoscope [id number] Procedure:             Colonoscopy Indications:           High risk colon cancer surveillance: Personal history                         of colonic polyps Providers:             [name], MD Referring MD:          Elveria Rising. Damita Dunnings, MD (Referring MD) Medicines:             Propofol per Anesthesia Complications:         No immediate complications. Procedure:             Pre-Anesthesia Assessment:                        - Prior to the procedure, a History and Physical was                         performed, and patient medications, allergies and                         sensitivities were reviewed. The patient's tolerance                         of previous anesthesia was reviewed.                        - The risks and benefits of the procedure and the                         sedation options and risks were discussed with the                         patient. All questions were answered and informed                         consent was obtained.                        After obtaining informed consent, the colonoscope was                         passed under direct vision. Throughout the procedure,                         the patient's blood pressure, pulse, and oxygen                         saturations were monitored continuously. The                         Colonoscope was introduced through the anus and  advanced to the the cecum, identified by appendiceal                         orifice and ileocecal valve. The colonoscopy was                         performed without difficulty. The patient tolerated                         the procedure well. The  quality of the bowel                         preparation was excellent. Findings:      The entire examined colon appeared normal on direct and retroflexion       views. Impression:            - The entire examined colon is normal on direct and                         retroflexion views.                        - No specimens collected. Recommendation:        - Repeat colonoscopy in 5 years for surveillance. Procedure Code(s):     --- Professional ---                        [phone], Colonoscopy, flexible; diagnostic, including                         collection of specimen(s) by brushing or washing, when                         performed (separate procedure) Diagnosis Code(s):     --- Professional ---                        Z86.010, Personal history of colonic polyps CPT copyright 2019 American Medical Association. All rights reserved. The codes documented in this report are preliminary and upon coder review may  be revised to meet current compliance requirements. [name], MD 11/10/2021 10:06:35 AM This report has been signed electronically. Number of Addenda: 0 Note Initiated On: 11/10/2021 9:36 AM Scope Withdrawal Time: 0 hours 9 minutes 57 seconds  Total Procedure Duration: 0 hours 14 minutes 50 seconds  Estimated Blood Loss:  Estimated blood loss: none.      [facility]

## 2021-11-11 ENCOUNTER — Encounter: Payer: Self-pay | Admitting: General Surgery

## 2021-11-12 NOTE — Anesthesia Postprocedure Evaluation (Signed)
Anesthesia Post Note  Patient: Ryan Blevins  Procedure(s) Performed: COLONOSCOPY WITH PROPOFOL  Patient location during evaluation: PACU Anesthesia Type: General Level of consciousness: awake and alert Pain management: pain level controlled Vital Signs Assessment: post-procedure vital signs reviewed and stable Respiratory status: spontaneous breathing, nonlabored ventilation, respiratory function stable and patient connected to nasal cannula oxygen Cardiovascular status: blood pressure returned to baseline and stable Postop Assessment: no apparent nausea or vomiting Anesthetic complications: no   No notable events documented.   Last Vitals:  Vitals:   11/10/21 1018 11/10/21 1028  BP: 123/78 (!) 144/84  Pulse: 75 72  Resp: 16 16  Temp:    SpO2: 95% 99%    Last Pain:  Vitals:   11/11/21 0749  TempSrc:   PainSc: 0-No pain                 Molli Barrows

## 2021-11-24 ENCOUNTER — Ambulatory Visit: Payer: PPO | Admitting: Dermatology

## 2021-11-24 ENCOUNTER — Other Ambulatory Visit: Payer: Self-pay

## 2021-11-24 DIAGNOSIS — C4492 Squamous cell carcinoma of skin, unspecified: Secondary | ICD-10-CM

## 2021-11-24 DIAGNOSIS — L700 Acne vulgaris: Secondary | ICD-10-CM | POA: Diagnosis not present

## 2021-11-24 DIAGNOSIS — C44222 Squamous cell carcinoma of skin of right ear and external auricular canal: Secondary | ICD-10-CM

## 2021-11-24 DIAGNOSIS — L578 Other skin changes due to chronic exposure to nonionizing radiation: Secondary | ICD-10-CM | POA: Diagnosis not present

## 2021-11-24 DIAGNOSIS — D492 Neoplasm of unspecified behavior of bone, soft tissue, and skin: Secondary | ICD-10-CM

## 2021-11-24 HISTORY — DX: Squamous cell carcinoma of skin, unspecified: C44.92

## 2021-11-24 NOTE — Progress Notes (Unsigned)
° °  Follow-Up Visit   Subjective  Ryan Blevins is a 71 y.o. male who presents for the following: check spot (R ear, ~71m sore).  The patient has spots, moles and lesions to be evaluated, some may be new or changing and the patient has concerns that these could be cancer.   The following portions of the chart were reviewed this encounter and updated as appropriate:       Review of Systems:  No other skin or systemic complaints except as noted in HPI or Assessment and Plan.  Objective  Well appearing patient in no apparent distress; mood and affect are within normal limits.  A focused examination was performed including face, ears. Relevant physical exam findings are noted in the Assessment and Plan.  Right Ear at the superior antihelix 0.7cm pap     L medial canthus Open comedone    Assessment & Plan  Neoplasm of skin Right Ear at the superior antihelix  Skin excision  Lesion length (cm):  0.7 Lesion width (cm):  0.7 Margin per side (cm):  0.2 Total excision diameter (cm):  1.1 Informed consent: discussed and consent obtained   Timeout: patient name, date of birth, surgical site, and procedure verified   Procedure prep:  Patient was prepped and draped in usual sterile fashion Prep type:  Isopropyl alcohol and povidone-iodine Anesthesia: the lesion was anesthetized in a standard fashion   Anesthetic:  1% lidocaine w/ epinephrine 1-100,000 buffered w/ 8.4% NaHCO3 Instrument used: #15 blade   Hemostasis achieved with: pressure   Hemostasis achieved with comment:  Electrocautery Outcome: patient tolerated procedure well with no complications   Post-procedure details: sterile dressing applied and wound care instructions given   Dressing type: bandage and bacitracin (Mupirocin)    Destruction of lesion Complexity: extensive   Destruction method: electrodesiccation and curettage   Informed consent: discussed and consent obtained   Timeout:  patient name, date of  birth, surgical site, and procedure verified Procedure prep:  Patient was prepped and draped in usual sterile fashion Prep type:  Isopropyl alcohol Anesthesia: the lesion was anesthetized in a standard fashion   Anesthetic:  1% lidocaine w/ epinephrine 1-100,000 buffered w/ 8.4% NaHCO3 Curettage performed in three different directions: Yes   Electrodesiccation performed over the curetted area: Yes   Lesion length (cm):  0.7 Lesion width (cm):  0.7 Margin per side (cm):  0.2 Final wound size (cm):  1.1 Hemostasis achieved with:  pressure, aluminum chloride and electrodesiccation Outcome: patient tolerated procedure well with no complications   Post-procedure details: sterile dressing applied and wound care instructions given   Dressing type: bandage and bacitracin    Specimen 1 - Surgical pathology Differential Diagnosis: D48.5 Cyst r/o SCC  Check Margins: No 0.7cm pap EDC today  Open comedone L medial canthus  Extraction x 1 today  Acne/Milia surgery - L medial canthus Procedure risks and benefits were discussed with the patient and verbal consent was obtained. Following prep of the skin on the L medial canthus with an alcohol swab, extraction of comedones was performed with a comedone extractor. Vaseline ointment was applied to each site. The patient tolerated the procedure well.   Return for as scheduled for TBSE.  I, SOthelia Pulling RMA, am acting as scribe for DSarina Ser MD .

## 2021-11-24 NOTE — Patient Instructions (Addendum)

## 2021-11-26 NOTE — Progress Notes
Date: 11/26/2021  Name: Daryl Graves   MRN#: [id number]   DOB: Sep 02, 1951   Age: 71 y.o.      Dr. Leane Call, DO  Daryl Fair PA-C    The patient was seen at the [facility] office.    CHIEF COMPLAINT: Status-post open left knee ganglion cyst removal    INTERIM HISTORY: Daryl Graves presents 11 months post-operatively from a open left knee ganglion cyst removal. The patient's pain level today is mild in severity and is dull in quality. The patient denies fevers, chills or systemic complaints. he denies any feelings of mechanical locking or catching. the patient notes that discomfort and pain over the lateral aspect of his knee around the cyst has returned.  The pain is mild-to-moderate.  He states that the size of swelling and pain feels just a similar as it did prior to the surgery.    PHYSICAL EXAM:  Constitutional: Daryl Graves is a 71 y.o. male in no acute distress.   Vitals:       Weight: (!) 259 lb 14.8 oz (117.9 kg)   Height: 6' 1'' (1.854 m)    Body mass index is 34.29 kg/m?Marland Kitchen  Psyc: he is alert and oriented x3 with a normal mood and affect.   HENT: AT/NC; hearing intact  Eyes: PERRLA. Extra-ocular movements are intact.  Skin: Normal temperature. There are no rashes, ulcerations, or lesions.  Lymphatic: No induration or enlargement.  Extremities: No swelling or calf tenderness. No clubbing. No cyanosis.  Gait:  []  Antalgic [x]  Normal []  Trendelenburg   Assistive devices: none    Left knee: Healed incision with 5 x 5 cm cystic mass is palpable. There is no drainage, bruising, or ecchymosis. No signs or symptoms of infection. There is no knee joint swelling.  A no knee joint effusion is present. Palpation of the knee reveals no tenderness. There is no calf tenderness with palpation. Negative Homan's sign.    Range of motion is 0? extension to 110? flexion with no pain. Knee is unstable with +2 laxity with varus/valgus stress at 0,30, and 90 degree. Normal patellar tracking. Muscle Strength of the knee is 5/5 with respect to flexors and extensors.     Special tests:   Anterior drawer test:  []  Positive [x]  Negative []  Not Tested  Posterior drawer test:  []  Positive [x]  Negative []  Not Tested  Patellar apprehension test: []  Positive [x]  Negative []  Not Tested  Patellar grind test:  []  Positive [x]  Negative []  Not Tested      RADIOGRAPHS: Not obtained today. Previous radiographs were reviewed in office.     MRI: MRI with MARS of the left knee  performed at [facility] on April 08, 2021 was reviewed in clinic today. Study shows:    Postoperative changes from total knee arthroplasty again noted. Despite WARP Sequencing   Assessment of the bone-in the interface is compromised. There is again identified extensive intraosseous cystic change within the posterior lateral posterior medial femoral condyles the implant similar to the prior study, again concerning for periprosthetic erosive change, particle disease and/or reactive synovitis.    Large cyst measuring 5 cm in greatest length within the deep subcutaneous tissues along the lateral joint line with the minimally larger than the prior study. The second smaller posterior lateral cyst is no longer evident.     IMPRESSION:  1.  Status post left total knee arthroplasty  2.  Status-post open left knee ganglion cyst removal  3.  5  x 5 cm cystic mass on lateral aspect of left knee    PLAN: The patient now has had this similar return of swelling within the ganglion cyst of his left knee. the aspiration and injection of corticosteroid at the last visit provided good diminishment of the swelling but lasted for only less than 2 months.  Overall he feels that his discomfort and swelling is similar as to what it was prior to surgery.  His radiographs are benign today.    The patient has failed multiple aspiration and corticosteroid injections, surgical cyst removal and still the swelling persists. At this time, we would like to request authorization for an MRI with MARS of the left knee as to evaluate the extent of his recurrent cyst of the left knee.     Overall the patient is functional with his activities of daily living.  Celebrex 200 mg twice daily was recommended to further improve his pain and swelling, the risks and benefits of medication were discussed today including GI side effects and kidney side effects.  Patient was given a prescription of Pennsaid gel for topical pain relief.     The patient was recommended to follow up with Dr. Nicolasa Ducking for further consideration of treatment options.  All of the patient's questions and concerns were addressed.        FOLLOW-UP: Return to clinic after the results of the MRI with MARS of the left knee     Thank you for allowing me to participate in Daryl Graves?s care. Please do not hesitate to contact me if you have any questions or concerns.           Leane Call, D.O.  Adult Reconstruction Specialist  ORTHOPEDIC SURGERY  11/26/2021                Susa Simmonds, MD

## 2021-11-29 ENCOUNTER — Telehealth: Payer: Self-pay

## 2021-11-29 NOTE — Telephone Encounter (Signed)
-----   Message from Ralene Bathe, MD sent at 11/25/2021  5:11 PM EST ----- ?Diagnosis ?Skin , right ear at the superior antihelix ?SQUAMOUS CELL CARCINOMA, KERATOACANTHOMA TYPE ? ?Cancer - SCC ?Already treated ?Recheck next visit ?

## 2021-11-29 NOTE — Telephone Encounter (Signed)
Patient informed of pathology results 

## 2021-11-30 ENCOUNTER — Ambulatory Visit: Payer: PRIVATE HEALTH INSURANCE

## 2021-11-30 ENCOUNTER — Encounter: Payer: Self-pay | Admitting: Dermatology

## 2021-11-30 DIAGNOSIS — M6740 Ganglion, unspecified site: Secondary | ICD-10-CM

## 2021-11-30 MED ORDER — DICLOFENAC SODIUM 2 % EX SOLN
3 refills | Status: AC
Start: 2021-11-30 — End: ?

## 2021-12-01 ENCOUNTER — Non-Acute Institutional Stay: Payer: PRIVATE HEALTH INSURANCE

## 2021-12-01 MED ORDER — DICLOFENAC SODIUM 3 % EX GEL
.5 g | Freq: Two times a day (BID) | TOPICAL | 5 refills | Status: AC
Start: 2021-12-01 — End: ?

## 2021-12-03 ENCOUNTER — Non-Acute Institutional Stay: Payer: PRIVATE HEALTH INSURANCE

## 2021-12-21 NOTE — Progress Notes
Date: 12/21/2021  Name: Daryl Graves   MRN#: [id number]   DOB: 12-Aug-1951   Age: 71 y.o.      Dr. Leane Call, DO      The patient was seen at the [facility] office.    CHIEF COMPLAINT: Status-post open left knee ganglion cyst removal    INTERIM HISTORY: Daryl Graves presents 1 year post-operatively from a open left knee ganglion cyst removal. In the interm since his last visit, he has completed a MRI of the left knee for which he returns today for review of those results.  The patient's pain level today is moderate in severity and is dull in quality. The patient denies fevers, chills or systemic complaints. He denies any feelings of mechanical locking or catching. the patient notes that discomfort and pain over the lateral aspect of his knee around the cyst has returned. He states that the size of swelling and pain feels similar as it did prior to the surgery. This has been limiting his activity and he is not able to go on walks secondary to pain and swelling. He notes an injury to the left knee in 2007 after his knee replacement. After that injury he developed a bump over the lateral left knee after this injury, but this gradually resolved over time.     PHYSICAL EXAM:  Constitutional: Daryl Graves is a 71 y.o. male in no acute distress.   Vitals:       Weight: (!) 259 lb 14.8 oz (117.9 kg)   Height: 6' 1'' (1.854 m)    Body mass index is 34.29 kg/m?Marland Kitchen  Psyc: he is alert and oriented x3 with a normal mood and affect.   HENT: AT/NC; hearing intact  Eyes: PERRLA. Extra-ocular movements are intact.  Skin: Normal temperature. There are no rashes, ulcerations, or lesions.  Lymphatic: No induration or enlargement.  Extremities: No swelling or calf tenderness. No clubbing. No cyanosis.  Gait:  []  Antalgic [x]  Normal []  Trendelenburg   Assistive devices: none    Left knee: Healed incision with 5 x 5 cm cystic mass is palpable. There is no drainage, bruising, or ecchymosis. No signs or symptoms of infection. There is no knee joint swelling.  A no knee joint effusion is present. Palpation of the knee reveals no tenderness. There is no calf tenderness with palpation. Negative Homan's sign.    Range of motion is 0? extension to 110? flexion with no pain. Knee is unstable with +2 laxity with varus/valgus stress at 0,30, and 90 degree. Normal patellar tracking. Muscle Strength of the knee is 5/5 with respect to flexors and extensors.     Special tests:   Anterior drawer test:  []  Positive [x]  Negative []  Not Tested  Posterior drawer test:  []  Positive [x]  Negative []  Not Tested  Patellar apprehension test: []  Positive [x]  Negative []  Not Tested  Patellar grind test:  []  Positive [x]  Negative []  Not Tested      RADIOGRAPHS: Not obtained today. Previous radiographs were reviewed in office.     MRI: MRI of the left knee was performed at [facility] on December 13, 2021. Study shows:  There is no interval change from the prior most recent study of July 2022.  There is a total knee arthroplasty in place with no subluxation fracture or loosening. There is still focal areas of interosseous cystic change along the posterior lateral and medial femoral condyle.  Complete stability to the greater than 5 cm cystic mass deep  within the subcutaneous areas along the lateral joint.     IMPRESSION:  1.  Status post left total knee arthroplasty  2.  Status-post open left knee ganglion cyst removal  3.  5 x 5 cm cystic mass on lateral aspect of left knee    PLAN: The patient now has had this similar return of swelling within the ganglion cyst of his left knee. the aspiration and injection of corticosteroid at the last visit provided good diminishment of the swelling but lasted for only less than 2 months. Overall he feels that his discomfort and swelling is similar as to what it was prior to surgery.     The patient has failed multiple aspiration and corticosteroid injections, surgical cyst removal and still the swelling persists. Treatment options of repeat aspiration versus surgical intervention of removal of the cyst with possible gastroc flap. Consult with Dr. Lucretia Field and plastic surgeon Dr. Monica Martinez were also discussed. He was advised that he does have an increase risk of infection given his history of diabetes. The patient was instructed to follow up with either Dr. Lucretia Field or Dr. Monica Martinez to discuss other options prior to considering surgical intervention.    FOLLOW-UP: Return to clinic after consult with either Dr. Lucretia Field and Dr. Monica Martinez.     Thank you for allowing me to participate in Boy Delamater Granier?s care. Please do not hesitate to contact me if you have any questions or concerns.           Leane Call, D.O.  Adult Reconstruction Specialist  ORTHOPEDIC SURGERY  12/21/2021    Susa Simmonds, MD

## 2021-12-22 ENCOUNTER — Ambulatory Visit: Payer: Commercial Managed Care - HMO

## 2021-12-23 DIAGNOSIS — M6740 Ganglion, unspecified site: Secondary | ICD-10-CM

## 2021-12-29 ENCOUNTER — Ambulatory Visit: Payer: Commercial Managed Care - HMO

## 2021-12-29 DIAGNOSIS — M6740 Ganglion, unspecified site: Secondary | ICD-10-CM

## 2022-01-24 ENCOUNTER — Ambulatory Visit: Payer: PPO | Admitting: Dermatology

## 2022-01-24 DIAGNOSIS — D485 Neoplasm of uncertain behavior of skin: Secondary | ICD-10-CM

## 2022-01-24 DIAGNOSIS — L578 Other skin changes due to chronic exposure to nonionizing radiation: Secondary | ICD-10-CM | POA: Diagnosis not present

## 2022-01-24 DIAGNOSIS — C44622 Squamous cell carcinoma of skin of right upper limb, including shoulder: Secondary | ICD-10-CM

## 2022-01-24 DIAGNOSIS — Z85828 Personal history of other malignant neoplasm of skin: Secondary | ICD-10-CM

## 2022-01-24 NOTE — Patient Instructions (Signed)
Electrodesiccation and Curettage (“Scrape and Burn”) Wound Care Instructions ° °Leave the original bandage on for 24 hours if possible.  If the bandage becomes soaked or soiled before that time, it is OK to remove it and examine the wound.  A small amount of post-operative bleeding is normal.  If excessive bleeding occurs, remove the bandage, place gauze over the site and apply continuous pressure (no peeking) over the area for 30 minutes. If this does not work, please call our clinic as soon as possible or page your doctor if it is after hours.  ° °Once a day, cleanse the wound with soap and water. It is fine to shower. If a thick crust develops you may use a Q-tip dipped into dilute hydrogen peroxide (mix 1:1 with water) to dissolve it.  Hydrogen peroxide can slow the healing process, so use it only as needed.   ° °After washing, apply petroleum jelly (Vaseline) or an antibiotic ointment if your doctor prescribed one for you, followed by a bandage.   ° °For best healing, the wound should be covered with a layer of ointment at all times. If you are not able to keep the area covered with a bandage to hold the ointment in place, this may mean re-applying the ointment several times a day.  Continue this wound care until the wound has healed and is no longer open. It may take several weeks for the wound to heal and close. ° °Itching and mild discomfort is normal during the healing process. ° °If you have any discomfort, you can take Tylenol (acetaminophen) or ibuprofen as directed on the bottle. (Please do not take these if you have an allergy to them or cannot take them for another reason). ° °Some redness, tenderness and white or yellow material in the wound is normal healing.  If the area becomes very sore and red, or develops a thick yellow-green material (pus), it may be infected; please notify us.   ° °Wound healing continues for up to one year following surgery. It is not unusual to experience pain in the scar  from time to time during the interval.  If the pain becomes severe or the scar thickens, you should notify the office.   ° °A slight amount of redness in a scar is expected for the first six months.  After six months, the redness will fade and the scar will soften and fade.  The color difference becomes less noticeable with time.  If there are any problems, return for a post-op surgery check at your earliest convenience. ° °To improve the appearance of the scar, you can use silicone scar gel, cream, or sheets (such as Mederma or Serica) every night for up to one year. These are available over the counter (without a prescription). ° °Please call our office at [phone] for any questions or concerns. °

## 2022-01-24 NOTE — Progress Notes (Signed)
Follow-Up Visit   Subjective  Ryan Blevins is a 71 y.o. male who presents for the following: Irregular skin lesion (R hand dorsum - persistent for weeks now, patient is concerned it may be cancerous.). The patient has spots, moles and lesions to be evaluated, some may be new or changing and the patient has concerns that these could be cancer.  The following portions of the chart were reviewed this encounter and updated as appropriate:   Tobacco  Allergies  Meds  Problems  Med Hx  Surg Hx  Fam Hx     Review of Systems:  No other skin or systemic complaints except as noted in HPI or Assessment and Plan.  Objective  Well appearing patient in no apparent distress; mood and affect are within normal limits.  A focused examination was performed including the face and hands. Relevant physical exam findings are noted in the Assessment and Plan.  R hand dorsum 1.2 cm hyperkeratotic papule.   Assessment & Plan  Neoplasm of uncertain behavior of skin R hand dorsum  Epidermal / dermal shaving  Lesion diameter (cm):  1.2 Informed consent: discussed and consent obtained   Timeout: patient name, date of birth, surgical site, and procedure verified   Procedure prep:  Patient was prepped and draped in usual sterile fashion Prep type:  Isopropyl alcohol Anesthesia: the lesion was anesthetized in a standard fashion   Anesthetic:  1% lidocaine w/ epinephrine 1-100,000 buffered w/ 8.4% NaHCO3 Instrument used: flexible razor blade   Hemostasis achieved with: pressure, aluminum chloride and electrodesiccation   Outcome: patient tolerated procedure well   Post-procedure details: sterile dressing applied and wound care instructions given   Dressing type: bandage and petrolatum    Destruction of lesion Complexity: extensive   Destruction method: electrodesiccation and curettage   Informed consent: discussed and consent obtained   Timeout:  patient name, date of birth, surgical site,  and procedure verified Procedure prep:  Patient was prepped and draped in usual sterile fashion Prep type:  Isopropyl alcohol Anesthesia: the lesion was anesthetized in a standard fashion   Anesthetic:  1% lidocaine w/ epinephrine 1-100,000 buffered w/ 8.4% NaHCO3 Curettage performed in three different directions: Yes   Electrodesiccation performed over the curetted area: Yes   Lesion length (cm):  1.2 Lesion width (cm):  1.2 Margin per side (cm):  0.2 Final wound size (cm):  1.6 Hemostasis achieved with:  pressure, aluminum chloride and electrodesiccation Outcome: patient tolerated procedure well with no complications   Post-procedure details: sterile dressing applied and wound care instructions given   Dressing type: bandage and petrolatum    Specimen 1 - Surgical pathology Differential Diagnosis: D48.5 r/o SCC vs other ED&C today  Check Margins: No  Actinic Damage - chronic, secondary to cumulative UV radiation exposure/sun exposure over time - diffuse scaly erythematous macules with underlying dyspigmentation - Recommend daily broad spectrum sunscreen SPF 30+ to sun-exposed areas, reapply every 2 hours as needed.  - Recommend staying in the shade or wearing long sleeves, sun glasses (UVA+UVB protection) and wide brim hats (4-inch brim around the entire circumference of the hat). - Call for new or changing lesions.  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  - Call if any new or changing  lesions are noted between office visits  Return for appointment as scheduled.  Luther Redo, CMA, am acting as scribe for Sarina Ser, MD . Documentation: I have reviewed the above documentation for accuracy and completeness, and I agree with the  above.  Sarina Ser, MD

## 2022-01-25 ENCOUNTER — Telehealth: Payer: Self-pay

## 2022-01-25 NOTE — Telephone Encounter (Signed)
Discussed biopsy results with pt  °

## 2022-01-25 NOTE — Telephone Encounter (Signed)
-----   Message from Ralene Bathe, MD sent at 01/25/2022  3:57 PM EDT ----- ?Diagnosis ?Skin , right hand dorsum ?WELL DIFFERENTIATED SQUAMOUS CELL CARCINOMA, BASE INVOLVED ? ?Cancer - SCC ?Already treated ?Recheck next visit ?

## 2022-02-08 ENCOUNTER — Encounter: Payer: Self-pay | Admitting: Dermatology

## 2022-03-04 DIAGNOSIS — H43813 Vitreous degeneration, bilateral: Secondary | ICD-10-CM | POA: Diagnosis not present

## 2022-03-17 ENCOUNTER — Encounter: Payer: Self-pay | Admitting: Ophthalmology

## 2022-03-17 DIAGNOSIS — H2512 Age-related nuclear cataract, left eye: Secondary | ICD-10-CM | POA: Diagnosis not present

## 2022-03-28 ENCOUNTER — Ambulatory Visit: Payer: PPO | Admitting: Anesthesiology

## 2022-03-28 ENCOUNTER — Encounter: Payer: Self-pay | Admitting: Ophthalmology

## 2022-03-28 ENCOUNTER — Ambulatory Visit
Admission: RE | Admit: 2022-03-28 | Discharge: 2022-03-28 | Disposition: A | Discharge status: Home / Self Care | Payer: PPO | Attending: Ophthalmology | Admitting: Ophthalmology

## 2022-03-28 ENCOUNTER — Encounter
Admission: RE | Disposition: A | Discharge status: Home / Self Care | Payer: Self-pay | Source: Home / Self Care | Attending: Ophthalmology

## 2022-03-28 ENCOUNTER — Other Ambulatory Visit: Payer: Self-pay

## 2022-03-28 DIAGNOSIS — F32A Depression, unspecified: Secondary | ICD-10-CM | POA: Insufficient documentation

## 2022-03-28 DIAGNOSIS — H2512 Age-related nuclear cataract, left eye: Secondary | ICD-10-CM | POA: Diagnosis not present

## 2022-03-28 DIAGNOSIS — Z0000 Encounter for general adult medical examination without abnormal findings: Secondary | ICD-10-CM

## 2022-03-28 DIAGNOSIS — I10 Essential (primary) hypertension: Secondary | ICD-10-CM | POA: Insufficient documentation

## 2022-03-28 HISTORY — PX: CATARACT EXTRACTION W/PHACO: SHX586

## 2022-03-28 HISTORY — DX: Presence of external hearing-aid: Z97.4

## 2022-03-28 SURGERY — PHACOEMULSIFICATION, CATARACT, WITH IOL INSERTION
Anesthesia: Monitor Anesthesia Care | Site: Eye | Laterality: Left

## 2022-03-28 MED ORDER — OXYCODONE HCL 5 MG PO TABS: 5.0000 mg | ORAL_TABLET | Freq: Once | ORAL | Status: DC | PRN

## 2022-03-28 MED ORDER — MOXIFLOXACIN HCL 0.5 % OP SOLN
OPHTHALMIC | Status: DC | PRN
  Administered 2022-03-28: 0.2 mL via OPHTHALMIC

## 2022-03-28 MED ORDER — FENTANYL CITRATE PF 50 MCG/ML IJ SOSY: 25.0000 ug | PREFILLED_SYRINGE | INTRAMUSCULAR | Status: DC | PRN

## 2022-03-28 MED ORDER — SIGHTPATH DOSE#1 BSS IO SOLN
INTRAOCULAR | Status: DC | PRN
  Administered 2022-03-28: 15 mL

## 2022-03-28 MED ORDER — LIDOCAINE HCL (PF) 2 % IJ SOLN
INTRAOCULAR | Status: DC | PRN
  Administered 2022-03-28: 1 mL via INTRAOCULAR

## 2022-03-28 MED ORDER — ARMC OPHTHALMIC DILATING DROPS
1.0000 | OPHTHALMIC | Status: DC | PRN
  Administered 2022-03-28 (×3): 1 via OPHTHALMIC

## 2022-03-28 MED ORDER — FENTANYL CITRATE (PF) 100 MCG/2ML IJ SOLN
INTRAMUSCULAR | Status: DC | PRN
  Administered 2022-03-28: 50 ug via INTRAVENOUS

## 2022-03-28 MED ORDER — OXYCODONE HCL 5 MG/5ML PO SOLN: 5.0000 mg | Freq: Once | ORAL | Status: DC | PRN

## 2022-03-28 MED ORDER — TETRACAINE HCL 0.5 % OP SOLN
1.0000 [drp] | OPHTHALMIC | Status: DC | PRN
  Administered 2022-03-28 (×3): 1 [drp] via OPHTHALMIC

## 2022-03-28 MED ORDER — SIGHTPATH DOSE#1 NA HYALUR & NA CHOND-NA HYALUR IO KIT
PACK | INTRAOCULAR | Status: DC | PRN
  Administered 2022-03-28: 1 via OPHTHALMIC

## 2022-03-28 MED ORDER — SIGHTPATH DOSE#1 BSS IO SOLN
INTRAOCULAR | Status: DC | PRN
  Administered 2022-03-28: 89 mL via OPHTHALMIC

## 2022-03-28 MED ORDER — MIDAZOLAM HCL 2 MG/2ML IJ SOLN
INTRAMUSCULAR | Status: DC | PRN
  Administered 2022-03-28: 1 mg via INTRAVENOUS

## 2022-03-28 MED ORDER — LACTATED RINGERS IV SOLN: INTRAVENOUS | Status: DC

## 2022-03-28 SURGICAL SUPPLY — 16 items
CATARACT SUITE SIGHTPATH (MISCELLANEOUS) ×2 IMPLANT
DISSECTOR HYDRO NUCLEUS 50X22 (MISCELLANEOUS) ×2 IMPLANT
FEE CATARACT SUITE SIGHTPATH (MISCELLANEOUS) ×1 IMPLANT
GLOVE SURG GAMMEX PI TX LF 7.5 (GLOVE) ×2 IMPLANT
GLOVE SURG SYN 8.5  E (GLOVE) ×2
GLOVE SURG SYN 8.5 E (GLOVE) ×1 IMPLANT
GLOVE SURG SYN 8.5 PF PI (GLOVE) ×1 IMPLANT
LENS IOL EYHANCE TORIC II 19.5 ×2 IMPLANT
LENS IOL EYHANCE TRC 150 19.5 IMPLANT
LENS IOL EYHNC TORIC 150 19.5 ×1 IMPLANT
NDL FILTER BLUNT 18X1 1/2 (NEEDLE) ×1 IMPLANT
NEEDLE FILTER BLUNT 18X 1/2SAF (NEEDLE) ×1
NEEDLE FILTER BLUNT 18X1 1/2 (NEEDLE) ×1 IMPLANT
SYR 3ML LL SCALE MARK (SYRINGE) ×2 IMPLANT
SYR 5ML LL (SYRINGE) ×2 IMPLANT
WATER STERILE IRR 250ML POUR (IV SOLUTION) ×2 IMPLANT

## 2022-03-28 NOTE — Anesthesia Preprocedure Evaluation (Addendum)
Anesthesia Evaluation  Patient identified by MRN, date of birth, ID band Patient awake    Reviewed: Allergy & Precautions, H&P , NPO status , Patient's Chart, lab work & pertinent test results, reviewed documented beta blocker date and time   Airway Mallampati: II  TM Distance: >3 FB Neck ROM: full    Dental no notable dental hx.    Pulmonary neg pulmonary ROS,    Pulmonary exam normal breath sounds clear to auscultation       Cardiovascular Exercise Tolerance: Good hypertension,  Rhythm:regular Rate:Normal     Neuro/Psych PSYCHIATRIC DISORDERS Depression negative neurological ROS     GI/Hepatic negative GI ROS, Neg liver ROS,   Endo/Other  negative endocrine ROS  Renal/GU negative Renal ROS  negative genitourinary   Musculoskeletal   Abdominal   Peds  Hematology negative hematology ROS (+)   Anesthesia Other Findings   Reproductive/Obstetrics negative OB ROS                            Anesthesia Physical Anesthesia Plan  ASA: 2  Anesthesia Plan: MAC   Post-op Pain Management:    Induction:   PONV Risk Score and Plan:   Airway Management Planned:   Additional Equipment:   Intra-op Plan:   Post-operative Plan:   Informed Consent: I have reviewed the patients History and Physical, chart, labs and discussed the procedure including the risks, benefits and alternatives for the proposed anesthesia with the patient or authorized representative who has indicated his/her understanding and acceptance.     Dental Advisory Given  Plan Discussed with: CRNA  Anesthesia Plan Comments:        Anesthesia Quick Evaluation

## 2022-03-28 NOTE — Discharge Instructions (Signed)

## 2022-03-28 NOTE — Op Note (Signed)
OPERATIVE NOTE  Ryan Blevins [id number] 03/28/2022   PREOPERATIVE DIAGNOSIS:  Nuclear sclerotic cataract left eye.  H25.12   POSTOPERATIVE DIAGNOSIS:    Nuclear sclerotic cataract left eye.     PROCEDURE:  Phacoemusification with posterior chamber intraocular lens placement of the left eye   LENS:   Implant Name Type Inv. Item Serial No. Manufacturer Lot No. LRB No. Used Action  LENS IOL EYHANCE TORIC II 19.5 - [id number]  LENS IOL EYHANCE TORIC II 19.5 [id number] [employer]  Left 1 Implanted      Procedure(s): CATARACT EXTRACTION PHACO AND INTRAOCULAR LENS PLACEMENT (IOC) LEFT eyhance toric lens 4.89 00:43.1 (Left)  [id number] +19.5   ULTRASOUND TIME: 0 minutes 43 seconds.  CDE 4.89   SURGEON:  Benay Pillow, MD, MPH   ANESTHESIA:  Topical with tetracaine drops augmented with 1% preservative-free intracameral lidocaine.  ESTIMATED BLOOD LOSS: <1 mL   COMPLICATIONS:  None.   DESCRIPTION OF PROCEDURE:  The patient was identified in the holding room and transported to the operating room and placed in the supine position under the operating microscope.  The left eye was identified as the operative eye and it was prepped and draped in the usual sterile ophthalmic fashion.  The verion system was registered without difficulty.   A 1.0 millimeter clear-corneal paracentesis was made at the 5:00 position. 0.5 ml of preservative-free 1% lidocaine with epinephrine was injected into the anterior chamber.  The anterior chamber was filled with viscoelastic.  A 2.4 millimeter keratome was used to make a near-clear corneal incision at the 2:00 position.  A curvilinear capsulorrhexis was made with a cystotome and capsulorrhexis forceps.  Balanced salt solution was used to hydrodissect and hydrodelineate the nucleus.   Phacoemulsification was then used in stop and chop fashion to remove the lens nucleus and epinucleus.  The remaining cortex was then removed using the irrigation and aspiration  handpiece. Viscoelastic was then placed into the capsular bag to distend it for lens placement.  A lens was then injected into the capsular bag.  The remaining viscoelastic was aspirated.  The lens was rotated with guidance from the [place] system.   Wounds were hydrated with balanced salt solution.  The anterior chamber was inflated to a physiologic pressure with balanced salt solution.  Intracameral vigamox 0.1 mL undiltued was injected into the eye and a drop placed onto the ocular surface.  No wound leaks were noted.  The patient was taken to the recovery room in stable condition without complications of anesthesia or surgery  Benay Pillow 03/28/2022, 11:30 AM

## 2022-03-28 NOTE — Transfer of Care (Signed)
Immediate Anesthesia Transfer of Care Note  Patient: Ryan Blevins  Procedure(s) Performed: CATARACT EXTRACTION PHACO AND INTRAOCULAR LENS PLACEMENT (IOC) LEFT eyhance toric lens 4.89 00:43.1 (Left: Eye)  Patient Location: PACU  Anesthesia Type: MAC  Level of Consciousness: awake, alert  and patient cooperative  Airway and Oxygen Therapy: Patient Spontanous Breathing and Patient connected to supplemental oxygen  Post-op Assessment: Post-op Vital signs reviewed, Patient's Cardiovascular Status Stable, Respiratory Function Stable, Patent Airway and No signs of Nausea or vomiting  Post-op Vital Signs: Reviewed and stable  Complications: No notable events documented.

## 2022-03-28 NOTE — Anesthesia Postprocedure Evaluation (Signed)
Anesthesia Post Note  Patient: Ryan Blevins  Procedure(s) Performed: CATARACT EXTRACTION PHACO AND INTRAOCULAR LENS PLACEMENT (IOC) LEFT eyhance toric lens 4.89 00:43.1 (Left: Eye)     Patient location during evaluation: PACU Anesthesia Type: MAC Level of consciousness: awake and alert Pain management: pain level controlled Vital Signs Assessment: post-procedure vital signs reviewed and stable Respiratory status: spontaneous breathing, nonlabored ventilation, respiratory function stable and patient connected to nasal cannula oxygen Cardiovascular status: stable and blood pressure returned to baseline Postop Assessment: no apparent nausea or vomiting Anesthetic complications: no   No notable events documented.  Cathlyn Tersigni, Glade [place]

## 2022-03-28 NOTE — Anesthesia Procedure Notes (Signed)
Procedure Name: MAC Date/Time: 03/28/2022 11:02 AM  Performed by: Dionne Bucy, CRNAPre-anesthesia Checklist: Patient identified, Emergency Drugs available, Suction available, Patient being monitored and Timeout performed Patient Re-evaluated:Patient Re-evaluated prior to induction Oxygen Delivery Method: Nasal cannula Placement Confirmation: positive ETCO2

## 2022-03-28 NOTE — H&P (Signed)
[facility]   Primary Care Physician:  Tonia Ghent, MD Ophthalmologist: Dr. Benay Pillow  Pre-Procedure History & Physical: HPI:  Ryan Blevins is a 71 y.o. male here for cataract surgery.   Past Medical History:  Diagnosis Date   Actinic keratosis 04/18/2018   Left nasal ala. Hypertrophic   Basal cell carcinoma 02/25/2010   Left med. pretibial. Superficial with focal infiltration.   Basal cell carcinoma 04/29/2013   Right superior helix. Excised: 06/12/2013   Basal cell carcinoma 11/09/2020   left medial upper eyelid - MOHs   Depression    Dysplastic nevus 08/19/2015   Left med medial calf. DN with severe atypia, lateral margin involved. Excised: 09/01/2015, margins free.   Hemorrhoid    Hyperlipidemia    Hypertension    Hyperuricemia    Low testosterone 2011   Prostate cancer ([place]) 2011   per Dr. Risa Grill.  Seed implant per Dr. Valere Dross   Skin cancer    Squamous cell carcinoma of skin 11/24/2021   Right Ear at the superior antihelix - tx with ED&C   Squamous cell carcinoma of skin 01/24/2022   right hand dorsum, EDC   Syncope    Wears hearing aid in both ears     Past Surgical History:  Procedure Laterality Date   COLONOSCOPY  02/20/2009   Dr Bary Castilla   COLONOSCOPY WITH PROPOFOL N/A 10/24/2018   Procedure: COLONOSCOPY WITH PROPOFOL;  Surgeon: [name], MD;  Location[facility] ENDOSCOPY;  Service: Endoscopy;  Laterality: N/A;   COLONOSCOPY WITH PROPOFOL N/A 11/10/2021   Procedure: COLONOSCOPY WITH PROPOFOL;  Surgeon: [name], MD;  Location: [facility] ENDOSCOPY;  Service: Endoscopy;  Laterality: N/A;    Prior to Admission medications   Medication Sig Start Date End Date Taking? Authorizing Provider  cetirizine (ZYRTEC) 10 MG tablet Take 10 mg by mouth daily.   Yes [provider]  colchicine 0.6 MG tablet Take 1 tablet (0.6 mg total) by mouth daily as needed. 09/21/21  Yes Tonia Ghent, MD  fluticasone [facility]) 50 MCG/ACT nasal spray  SHAKE LIQUID AND USE 2 SPRAYS IN [facility] NOSTRIL DAILY 09/21/21  Yes Tonia Ghent, MD  lisinopril (ZESTRIL) 30 MG tablet Take 1 tablet (30 mg total) by mouth daily. 09/21/21  Yes Tonia Ghent, MD  rosuvastatin (CRESTOR) 10 MG tablet 1 tab daily. 09/21/21  Yes Tonia Ghent, MD  sildenafil (REVATIO) 20 MG tablet Take 1-3 tablets (20-60 mg total) by mouth daily as needed. 09/21/21  Yes Tonia Ghent, MD  tamsulosin (FLOMAX) 0.4 MG CAPS capsule Take 1 capsule (0.4 mg total) by mouth daily. 09/21/21  Yes Tonia Ghent, MD  venlafaxine XR (EFFEXOR-XR) 75 MG 24 hr capsule Take 2 capsules (150 mg total) by mouth daily. 09/21/21  Yes Tonia Ghent, MD  omeprazole (PRILOSEC) 20 MG capsule Take 1 capsule (20 mg total) by mouth daily as needed. Patient not taking: Reported on 03/17/2022 09/21/21   Tonia Ghent, MD    Allergies as of 03/08/2022 - Review Complete 02/08/2022  Allergen Reaction Noted   Atorvastatin  04/15/2010   Bupropion hcl  04/15/2010   Codeine  04/15/2010    Family History  Problem Relation Age of Onset   Heart disease Mother        Multiple heart surgeries, valve repair   Osteoporosis Mother        Vertebral fracture   Stroke Mother    Hypertension Father    Hyperlipidemia Father  Diabetes Father    Parkinson's disease Father    Colon cancer Neg Hx     Social History   Socioeconomic History   Marital status: Married    Spouse name: Not on file   Number of children: Not on file   Years of education: Not on file   Highest education level: Not on file  Occupational History   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.  [employer] grad, doctorate in Counseling, [place]. Worked in [employer].   [place]   Works in [employer] 2012, in relationship for 20+ years   Social Determinants of Radio broadcast assistant Strain: Not on [employer] Insecurity: Not on file  Transportation Needs: Not on file  Physical Activity: Not on file  Stress: Not on file  Social Connections: Not on file  Intimate Partner Violence: Not on file    Review of Systems: See HPI, otherwise negative ROS  Physical Exam: BP (!) 152/94   Pulse 81   Temp (!) 97.5 F (36.4 C) (Temporal)   Resp 11   Ht 6' (1.829 m)   Wt 91.6 kg   SpO2 100%   BMI 27.40 kg/m  General:   Alert, cooperative in NAD Head:  Normocephalic and atraumatic. Respiratory:  Normal work of breathing. Cardiovascular:  RRR  Impression/Plan: Ryan Blevins is here for cataract surgery.  Risks, benefits, limitations, and alternatives regarding cataract surgery have been reviewed with the patient.  Questions have been answered.  All parties agreeable.   Benay Pillow, MD  03/28/2022, 10:51 AM

## 2022-03-29 ENCOUNTER — Encounter: Payer: Self-pay | Admitting: Ophthalmology

## 2022-04-04 ENCOUNTER — Encounter: Payer: Self-pay | Admitting: Anesthesiology

## 2022-04-07 DIAGNOSIS — H2511 Age-related nuclear cataract, right eye: Secondary | ICD-10-CM | POA: Diagnosis not present

## 2022-04-18 ENCOUNTER — Ambulatory Visit: Admit: 2022-04-18 | Payer: PPO | Admitting: Ophthalmology

## 2022-04-18 SURGERY — PHACOEMULSIFICATION, CATARACT, WITH IOL INSERTION
Anesthesia: Topical | Laterality: Right

## 2022-04-19 ENCOUNTER — Ambulatory Visit: Payer: PPO | Admitting: Dermatology

## 2022-04-19 ENCOUNTER — Encounter: Payer: Self-pay | Admitting: Dermatology

## 2022-04-19 DIAGNOSIS — L570 Actinic keratosis: Secondary | ICD-10-CM | POA: Diagnosis not present

## 2022-04-19 DIAGNOSIS — L578 Other skin changes due to chronic exposure to nonionizing radiation: Secondary | ICD-10-CM

## 2022-04-19 DIAGNOSIS — Z85828 Personal history of other malignant neoplasm of skin: Secondary | ICD-10-CM

## 2022-04-19 DIAGNOSIS — L821 Other seborrheic keratosis: Secondary | ICD-10-CM | POA: Diagnosis not present

## 2022-04-19 DIAGNOSIS — L814 Other melanin hyperpigmentation: Secondary | ICD-10-CM

## 2022-04-19 DIAGNOSIS — D229 Melanocytic nevi, unspecified: Secondary | ICD-10-CM | POA: Diagnosis not present

## 2022-04-19 DIAGNOSIS — L820 Inflamed seborrheic keratosis: Secondary | ICD-10-CM

## 2022-04-19 DIAGNOSIS — Z1283 Encounter for screening for malignant neoplasm of skin: Secondary | ICD-10-CM | POA: Diagnosis not present

## 2022-04-19 DIAGNOSIS — L603 Nail dystrophy: Secondary | ICD-10-CM

## 2022-04-19 DIAGNOSIS — Z86018 Personal history of other benign neoplasm: Secondary | ICD-10-CM | POA: Diagnosis not present

## 2022-04-19 DIAGNOSIS — D1800 Hemangioma unspecified site: Secondary | ICD-10-CM

## 2022-04-19 MED ORDER — JUBLIA 10 % EX SOLN: 1.0000 | Freq: Every evening | CUTANEOUS | 11 refills | Status: DC

## 2022-04-19 NOTE — Progress Notes (Signed)
Follow-Up Visit   Subjective  Ryan Blevins is a 71 y.o. male who presents for the following: Total body skin exam (Hx of BCCs, SCCs, Dysplastic Nevus, AKs) and check spots (Back, few months, itchy). The patient presents for Total-Body Skin Exam (TBSE) for skin cancer screening and mole check.  The patient has spots, moles and lesions to be evaluated, some may be new or changing and the patient has concerns that these could be cancer.   The following portions of the chart were reviewed this encounter and updated as appropriate:   Tobacco  Allergies  Meds  Problems  Med Hx  Surg Hx  Fam Hx     Review of Systems:  No other skin or systemic complaints except as noted in HPI or Assessment and Plan.  Objective  Well appearing patient in no apparent distress; mood and affect are within normal limits.  A full examination was performed including scalp, head, eyes, ears, nose, lips, neck, chest, axillae, abdomen, back, buttocks, bilateral upper extremities, bilateral lower extremities, hands, feet, fingers, toes, fingernails, and toenails. All findings within normal limits unless otherwise noted below.  back x 6 (6) Stuck on waxy paps with erythema  bil dorsum hands x 9, R forehead x 1 (10) Pink scaly macules  bil great toenails Toenail dystrophy, feet otherwise clear   Assessment & Plan   History of Basal Cell Carcinoma of the Skin - No evidence of recurrence today - Recommend regular full body skin exams - Recommend daily broad spectrum sunscreen SPF 30+ to sun-exposed areas, reapply every 2 hours as needed.  - Call if any new or changing lesions are noted between office visits  - L med pretibial, R sup helix, L medial upper eyelid  History of Squamous Cell Carcinoma of the Skin - No evidence of recurrence today - No lymphadenopathy - Recommend regular full body skin exams - Recommend daily broad spectrum sunscreen SPF 30+ to sun-exposed areas, reapply every 2 hours as  needed.  - Call if any new or changing lesions are noted between office visits - R ear at the sup antihelix, R hand dorsum  History of Dysplastic Nevi - No evidence of recurrence today - Recommend regular full body skin exams - Recommend daily broad spectrum sunscreen SPF 30+ to sun-exposed areas, reapply every 2 hours as needed.  - Call if any new or changing lesions are noted between office visits  -L med mid medial calf   Lentigines - Scattered tan macules - Due to sun exposure - Benign-appearing, observe - Recommend daily broad spectrum sunscreen SPF 30+ to sun-exposed areas, reapply every 2 hours as needed. - Call for any changes  Seborrheic Keratoses - Stuck-on, waxy, tan-brown papules and/or plaques  - Benign-appearing - Discussed benign etiology and prognosis. - Observe - Call for any changes  Melanocytic Nevi - Tan-brown and/or pink-flesh-colored symmetric macules and papules - Benign appearing on exam today - Observation - Call clinic for new or changing moles - Recommend daily use of broad spectrum spf 30+ sunscreen to sun-exposed areas.   Hemangiomas - Red papules - Discussed benign nature - Observe - Call for any changes  Actinic Damage - Chronic condition, secondary to cumulative UV/sun exposure - diffuse scaly erythematous macules with underlying dyspigmentation - Recommend daily broad spectrum sunscreen SPF 30+ to sun-exposed areas, reapply every 2 hours as needed.  - Staying in the shade or wearing long sleeves, sun glasses (UVA+UVB protection) and wide brim hats (4-inch brim around the  entire circumference of the hat) are also recommended for sun protection.  - Call for new or changing lesions.  Skin cancer screening performed today.  Inflamed seborrheic keratosis (6) back x 6 Symptomatic,irritating, patient would like treated. Destruction of lesion - back x 6 Complexity: simple   Destruction method: cryotherapy   Informed consent: discussed and  consent obtained   Timeout:  patient name, date of birth, surgical site, and procedure verified Lesion destroyed using liquid nitrogen: Yes   Region frozen until ice ball extended beyond lesion: Yes   Outcome: patient tolerated procedure well with no complications   Post-procedure details: wound care instructions given    AK (actinic keratosis) (10) bil dorsum hands x 9, R forehead x 1 Destruction of lesion - bil dorsum hands x 9, R forehead x 1 Complexity: simple   Destruction method: cryotherapy   Informed consent: discussed and consent obtained   Timeout:  patient name, date of birth, surgical site, and procedure verified Lesion destroyed using liquid nitrogen: Yes   Region frozen until ice ball extended beyond lesion: Yes   Outcome: patient tolerated procedure well with no complications   Post-procedure details: wound care instructions given    Nail dystrophy bil great toenails Trauma vs Tinea Chronic and persistent condition with duration or expected duration over one year. Condition is symptomatic / bothersome to patient. Not to goal. Discussed empirically treating with oral terbinafine vs topical treatment Start Jublia solution qhs to affected toenails Efinaconazole (JUBLIA) 10 % SOLN - bil great toenails Apply 1 Application topically at bedtime. Apply to affected toenails qhs  Return in about 6 months (around 10/20/2022) for UBSE, Hx of AKs, Hx of BCC, Hx of SCC, Hx of Dysplastic nevi.  I, Othelia Pulling, RMA, am acting as scribe for Sarina Ser, MD . Documentation: I have reviewed the above documentation for accuracy and completeness, and I agree with the above.  Sarina Ser, MD

## 2022-04-19 NOTE — Patient Instructions (Addendum)
Cryotherapy Aftercare  Wash gently with soap and water everyday.   Apply Vaseline and Band-Aid daily until healed.     Due to recent changes in healthcare laws, you may see results of your pathology and/or laboratory studies on MyChart before the doctors have had a chance to review them. We understand that in some cases there may be results that are confusing or concerning to you. Please understand that not all results are received at the same time and often the doctors may need to interpret multiple results in order to provide you with the best plan of care or course of treatment. Therefore, we ask that you please give us 2 business days to thoroughly review all your results before contacting the office for clarification. Should we see a critical lab result, you will be contacted sooner.   If You Need Anything After Your Visit  If you have any questions or concerns for your doctor, please call our main line at [phone] and press option 4 to reach your doctor's medical assistant. If no one answers, please leave a voicemail as directed and we will return your call as soon as possible. Messages left after 4 pm will be answered the following business day.   You may also send us a message via MyChart. We typically respond to MyChart messages within 1-2 business days.  For prescription refills, please ask your pharmacy to contact our office. Our fax number is [phone].  If you have an urgent issue when the clinic is closed that cannot wait until the next business day, you can page your doctor at the number below.    Please note that while we do our best to be available for urgent issues outside of office hours, we are not available 24/7.   If you have an urgent issue and are unable to reach us, you may choose to seek medical care at your doctor's office, retail clinic, urgent care center, or emergency room.  If you have a medical emergency, please immediately call 911 or go to the  emergency department.  Pager Numbers  - Dr. Kowalski: [phone]  - Dr. Moye: [phone]  - Dr. Stewart: [phone]  In the event of inclement weather, please call our main line at [phone] for an update on the status of any delays or closures.  Dermatology Medication Tips: Please keep the boxes that topical medications come in in order to help keep track of the instructions about where and how to use these. Pharmacies typically print the medication instructions only on the boxes and not directly on the medication tubes.   If your medication is too expensive, please contact our office at [phone] option 4 or send us a message through MyChart.   We are unable to tell what your co-pay for medications will be in advance as this is different depending on your insurance coverage. However, we may be able to find a substitute medication at lower cost or fill out paperwork to get insurance to cover a needed medication.   If a prior authorization is required to get your medication covered by your insurance company, please allow us 1-2 business days to complete this process.  Drug prices often vary depending on where the prescription is filled and some pharmacies may offer cheaper prices.  The website www.goodrx.com contains coupons for medications through different pharmacies. The prices here do not account for what the cost may be with help from insurance (it may be cheaper with your insurance), but the website can   give you the price if you did not use any insurance.  - You can print the associated coupon and take it with your prescription to the pharmacy.  - You may also stop by our office during regular business hours and pick up a GoodRx coupon card.  - If you need your prescription sent electronically to a different pharmacy, notify our office through [place] MyChart or by phone at [phone] option 4.     Si Usted Necesita Algo Despus de Su Visita  Tambin puede  enviarnos un mensaje a travs de MyChart. Por lo general respondemos a los mensajes de MyChart en el transcurso de 1 a 2 das hbiles.  Para renovar recetas, por favor pida a su farmacia que se ponga en contacto con nuestra oficina. Nuestro nmero de fax es el [phone].  Si tiene un asunto urgente cuando la clnica est cerrada y que no puede esperar hasta el siguiente da hbil, puede llamar/localizar a su doctor(a) al nmero que aparece a continuacin.   Por favor, tenga en cuenta que aunque hacemos todo lo posible para estar disponibles para asuntos urgentes fuera del horario de oficina, no estamos disponibles las 24 horas del da, los 7 das de la semana.   Si tiene un problema urgente y no puede comunicarse con nosotros, puede optar por buscar atencin mdica  en el consultorio de su doctor(a), en una clnica privada, en un centro de atencin urgente o en una sala de emergencias.  Si tiene una emergencia mdica, por favor llame inmediatamente al 911 o vaya a la sala de emergencias.  Nmeros de bper  - Dr. Kowalski: [phone]  - Dra. Moye: [phone]  - Dra. Stewart: [phone]  En caso de inclemencias del tiempo, por favor llame a nuestra lnea principal al [phone] para una actualizacin sobre el estado de cualquier retraso o cierre.  Consejos para la medicacin en dermatologa: Por favor, guarde las cajas en las que vienen los medicamentos de uso tpico para ayudarle a seguir las instrucciones sobre dnde y cmo usarlos. Las farmacias generalmente imprimen las instrucciones del medicamento slo en las cajas y no directamente en los tubos del medicamento.   Si su medicamento es muy caro, por favor, pngase en contacto con nuestra oficina llamando al [phone] y presione la opcin 4 o envenos un mensaje a travs de MyChart.   No podemos decirle cul ser su copago por los medicamentos por adelantado ya que esto es diferente dependiendo de la cobertura de su seguro.  Sin embargo, es posible que podamos encontrar un medicamento sustituto a menor costo o llenar un formulario para que el seguro cubra el medicamento que se considera necesario.   Si se requiere una autorizacin previa para que su compaa de seguros cubra su medicamento, por favor permtanos de 1 a 2 das hbiles para completar este proceso.  Los precios de los medicamentos varan con frecuencia dependiendo del lugar de dnde se surte la receta y alguna farmacias pueden ofrecer precios ms baratos.  El sitio web www.goodrx.com tiene cupones para medicamentos de diferentes farmacias. Los precios aqu no tienen en cuenta lo que podra costar con la ayuda del seguro (puede ser ms barato con su seguro), pero el sitio web puede darle el precio si no utiliz ningn seguro.  - Puede imprimir el cupn correspondiente y llevarlo con su receta a la farmacia.  - Tambin puede pasar por nuestra oficina durante el horario de atencin regular y recoger una tarjeta de cupones de GoodRx.  -   Si necesita que su receta se enve electrnicamente a una farmacia diferente, informe a nuestra oficina a travs de MyChart de [place] o por telfono llamando al [phone] y presione la opcin 4.  

## 2022-06-16 DIAGNOSIS — Z0100 Encounter for examination of eyes and vision without abnormal findings: Secondary | ICD-10-CM | POA: Diagnosis not present

## 2022-06-20 ENCOUNTER — Encounter: Payer: Self-pay | Admitting: Ophthalmology

## 2022-06-24 NOTE — Discharge Instructions (Signed)

## 2022-06-27 DIAGNOSIS — H2511 Age-related nuclear cataract, right eye: Secondary | ICD-10-CM | POA: Diagnosis not present

## 2022-07-04 ENCOUNTER — Encounter: Payer: Self-pay | Admitting: Dermatology

## 2022-07-04 ENCOUNTER — Ambulatory Visit: Payer: PPO | Admitting: Dermatology

## 2022-07-04 DIAGNOSIS — L219 Seborrheic dermatitis, unspecified: Secondary | ICD-10-CM | POA: Diagnosis not present

## 2022-07-04 DIAGNOSIS — Z79899 Other long term (current) drug therapy: Secondary | ICD-10-CM

## 2022-07-04 MED ORDER — CLOBETASOL PROPIONATE 0.05 % EX SHAM: MEDICATED_SHAMPOO | CUTANEOUS | 5 refills | Status: DC

## 2022-07-04 NOTE — Patient Instructions (Addendum)
Seborrheic Dermatitis  -  is a chronic persistent rash characterized by pinkness and scaling most commonly of the mid face but also can occur on the scalp (dandruff), ears; mid chest, mid back and groin.  It tends to be exacerbated by stress and cooler weather.  People who have neurologic disease may experience new onset or exacerbation of existing seborrheic dermatitis.  The condition is not curable but treatable and can be controlled.  Start clobetasol 0.05% shampoo 5x weekly for 1 month then if improved can decrease to 2-3 times weekly. Massage shampoo into scalp and let sit a few minutes before rising out. May follow with regular shampoo. Recommend shampooing with regular shampoo daily.   Due to recent changes in healthcare laws, you may see results of your pathology and/or laboratory studies on MyChart before the doctors have had a chance to review them. We understand that in some cases there may be results that are confusing or concerning to you. Please understand that not all results are received at the same time and often the doctors may need to interpret multiple results in order to provide you with the best plan of care or course of treatment. Therefore, we ask that you please give [place] 2 business days to thoroughly review all your results before contacting the office for clarification. Should we see a critical lab result, you will be contacted sooner.   If You Need Anything After Your Visit  If you have any questions or concerns for your doctor, please call our main line at [phone] and press option 4 to reach your doctor's medical assistant. If no one answers, please leave a voicemail as directed and we will return your call as soon as possible. Messages left after 4 pm will be answered the following business day.   You may also send [place] a message via [place]. We typically respond to MyChart messages within 1-2 business days.  For prescription refills, please ask your pharmacy to contact  our office. Our fax number is [phone].  If you have an urgent issue when the clinic is closed that cannot wait until the next business day, you can page your doctor at the number below.    Please note that while we do our best to be available for urgent issues outside of office hours, we are not available 24/7.   If you have an urgent issue and are unable to reach [place], you may choose to seek medical care at your doctor's office, retail clinic, urgent care center, or emergency room.  If you have a medical emergency, please immediately call 911 or go to the emergency department.  Pager Numbers  - Dr. Nehemiah Massed: [phone]  - Dr. Laurence Ferrari: [phone]  - Dr. Nicole Kindred: [phone]  In the event of inclement weather, please call our main line at [phone] for an update on the status of any delays or closures.  Dermatology Medication Tips: Please keep the boxes that topical medications come in in order to help keep track of the instructions about where and how to use these. Pharmacies typically print the medication instructions only on the boxes and not directly on the medication tubes.   If your medication is too expensive, please contact our office at [phone] option 4 or send [place] a message through [place].   We are unable to tell what your co-pay for medications will be in advance as this is different depending on your insurance coverage. However, we may be able to find a substitute medication at lower  cost or fill out paperwork to get insurance to cover a needed medication.   If a prior authorization is required to get your medication covered by your insurance company, please allow [place] 1-2 business days to complete this process.  Drug prices often vary depending on where the prescription is filled and some pharmacies may offer cheaper prices.  The website www.goodrx.com contains coupons for medications through different pharmacies. The prices here do not account for what the cost  may be with help from insurance (it may be cheaper with your insurance), but the website can give you the price if you did not use any insurance.  - You can print the associated coupon and take it with your prescription to the pharmacy.  - You may also stop by our office during regular business hours and pick up a GoodRx coupon card.  - If you need your prescription sent electronically to a different pharmacy, notify our office through [facility] or by phone at [phone] option 4.     Si Usted Necesita Algo Despus de Su Visita  Tambin puede enviarnos un mensaje a travs de Pharmacist, community. Por lo general respondemos a los mensajes de MyChart en el transcurso de 1 a 2 das hbiles.  Para renovar recetas, por favor pida a su farmacia que se ponga en contacto con nuestra oficina. Harland Dingwall de fax es [place] [phone].  Si tiene un asunto urgente cuando la clnica est cerrada y que no puede esperar hasta el siguiente da hbil, puede llamar/localizar a su doctor(a) al nmero que aparece a continuacin.   Por favor, tenga en cuenta que aunque hacemos todo lo posible para estar disponibles para asuntos urgentes fuera del horario de [place], no estamos disponibles las 24 horas del da, los 7 das de la [place].   Si tiene un problema urgente y no puede comunicarse con nosotros, puede optar por buscar atencin mdica  en el consultorio de su doctor(a), en una clnica privada, en un centro de atencin urgente o en una sala de emergencias.  Si tiene Engineering geologist, por favor llame inmediatamente al 911 o vaya a la sala de emergencias.  Nmeros de bper  - Dr. Nehemiah Massed: [phone]  - Dra. Moye: [phone]  - Dra. Nicole Kindred: [phone]  En caso de inclemencias del [place], por favor llame a Johnsie Kindred principal al [phone] para una actualizacin sobre el [place] de cualquier retraso o cierre.  Consejos para la medicacin en dermatologa: Por favor, guarde las cajas en  las que vienen los medicamentos de uso tpico para ayudarle a seguir las instrucciones sobre dnde y cmo usarlos. Las farmacias generalmente imprimen las instrucciones del medicamento slo en las cajas y no directamente en los tubos del [place].   Si su medicamento es muy caro, por favor, pngase en contacto con Zigmund Daniel llamando al [phone] y presione la opcin 4 o envenos un mensaje a travs de Pharmacist, community.   No podemos decirle cul ser su copago por los medicamentos por adelantado ya que esto es diferente dependiendo de la cobertura de su seguro. Sin embargo, es posible que podamos encontrar un medicamento sustituto a Electrical engineer un formulario para que el seguro cubra el medicamento que se considera necesario.   Si se requiere una autorizacin previa para que su compaa de seguros [place] su medicamento, por favor permtanos de 1 a 2 das hbiles para completar este proceso.  Los precios de los medicamentos varan con frecuencia dependiendo del Environmental consultant de dnde se surte  la receta y alguna farmacias pueden ofrecer precios ms baratos.  El sitio web www.goodrx.com tiene cupones para medicamentos de Airline pilot. Los precios aqu no tienen en cuenta lo que podra costar con la ayuda del seguro (puede ser ms barato con su seguro), pero el sitio web puede darle el precio si no utiliz Research scientist (physical sciences).  - Puede imprimir el cupn correspondiente y llevarlo con su receta a la farmacia.  - Tambin puede pasar por nuestra oficina durante el horario de atencin regular y Charity fundraiser una tarjeta de cupones de GoodRx.  - Si necesita que su receta se enve electrnicamente a una farmacia diferente, informe a nuestra oficina a travs de MyChart de [place] o por telfono llamando al [phone] y presione la opcin 4.

## 2022-07-04 NOTE — Progress Notes (Signed)
   Follow-Up Visit   Subjective  Ryan Blevins is a 71 y.o. male who presents for the following: Rash. Patient here today for scale and itch at scalp. Currently using tar shampoo. He has had this problem in the past but not for several years.  He recently had a break in his house that may have stressed to him for the past several weeks which may have caused this flare.  The following portions of the chart were reviewed this encounter and updated as appropriate:   Tobacco  Allergies  Meds  Problems  Med Hx  Surg Hx  Fam Hx     Review of Systems:  No other skin or systemic complaints except as noted in HPI or Assessment and Plan.  Objective  Well appearing patient in no apparent distress; mood and affect are within normal limits.  A focused examination was performed including scalp. Relevant physical exam findings are noted in the Assessment and Plan.  Scalp Pink patches with greasy scale.    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 Condition is bothersome/symptomatic for patient. Currently flared.  Seborrheic Dermatitis  -  is a chronic persistent rash characterized by pinkness and scaling most commonly of the mid face but also can occur on the scalp (dandruff), ears; mid chest, mid back and groin.  It tends to be exacerbated by stress and cooler weather.  People who have neurologic disease may experience new onset or exacerbation of existing seborrheic dermatitis.  The condition is not curable but treatable and can be controlled.  Psoriasis is a chronic non-curable, but treatable genetic/hereditary disease that may have other systemic features affecting other organ systems such as joints (Psoriatic Arthritis). It is associated with an increased risk of inflammatory bowel disease, heart disease, non-alcoholic fatty liver disease, and depression.    Start  clobetasol 0.05% shampoo 5x weekly for 1 month then if improved can decrease to 2-3 times weekly. Massage shampoo into scalp and let sit a few minutes before rising out. May follow with regular shampoo Recommend shampooing with regular shampoo daily.   Clobetasol Propionate 0.05 % shampoo - Scalp Massage into scalp and let sit a few minutes before rinsing 5 times weekly as directed. Once improved may decrease to 2-3 times a week.  Return for as scheduled.  Graciella Belton, RMA, am acting as scribe for Sarina Ser, MD . Documentation: I have reviewed the above documentation for accuracy and completeness, and I agree with the above.  Sarina Ser, MD

## 2022-07-08 NOTE — Anesthesia Preprocedure Evaluation (Signed)
Anesthesia Evaluation  Patient identified by MRN, date of birth, ID band Patient awake    Reviewed: Allergy & Precautions, H&P , NPO status , Patient's Chart, lab work & pertinent test results, reviewed documented beta blocker date and time   Airway Mallampati: II  TM Distance: >3 FB Neck ROM: full    Dental no notable dental hx.    Pulmonary neg pulmonary ROS,    Pulmonary exam normal breath sounds clear to auscultation       Cardiovascular Exercise Tolerance: Good hypertension,  Rhythm:regular Rate:Normal     Neuro/Psych PSYCHIATRIC DISORDERS Depression negative neurological ROS     GI/Hepatic negative GI ROS, Neg liver ROS,   Endo/Other  negative endocrine ROS  Renal/GU negative Renal ROS  negative genitourinary   Musculoskeletal   Abdominal   Peds  Hematology negative hematology ROS (+)   Anesthesia Other Findings   Reproductive/Obstetrics negative OB ROS                             Anesthesia Physical  Anesthesia Plan  ASA: 2  Anesthesia Plan: MAC   Post-op Pain Management: Minimal or no pain anticipated   Induction:   PONV Risk Score and Plan:   Airway Management Planned: Natural Airway  Additional Equipment:   Intra-op Plan:   Post-operative Plan:   Informed Consent: I have reviewed the patients History and Physical, chart, labs and discussed the procedure including the risks, benefits and alternatives for the proposed anesthesia with the patient or authorized representative who has indicated his/her understanding and acceptance.     Dental Advisory Given  Plan Discussed with: CRNA  Anesthesia Plan Comments:        Anesthesia Quick Evaluation

## 2022-07-11 ENCOUNTER — Ambulatory Visit: Payer: PPO | Admitting: Anesthesiology

## 2022-07-11 ENCOUNTER — Encounter: Payer: Self-pay | Admitting: Ophthalmology

## 2022-07-11 ENCOUNTER — Ambulatory Visit
Admission: RE | Admit: 2022-07-11 | Discharge: 2022-07-11 | Disposition: A | Discharge status: Home / Self Care | Payer: PPO | Source: Ambulatory Visit | Attending: Ophthalmology | Admitting: Ophthalmology

## 2022-07-11 ENCOUNTER — Other Ambulatory Visit: Payer: Self-pay

## 2022-07-11 ENCOUNTER — Encounter
Admission: RE | Disposition: A | Discharge status: Home / Self Care | Payer: Self-pay | Source: Ambulatory Visit | Attending: Ophthalmology

## 2022-07-11 DIAGNOSIS — H2511 Age-related nuclear cataract, right eye: Secondary | ICD-10-CM | POA: Diagnosis not present

## 2022-07-11 DIAGNOSIS — I10 Essential (primary) hypertension: Secondary | ICD-10-CM | POA: Insufficient documentation

## 2022-07-11 DIAGNOSIS — F32A Depression, unspecified: Secondary | ICD-10-CM | POA: Diagnosis not present

## 2022-07-11 DIAGNOSIS — E785 Hyperlipidemia, unspecified: Secondary | ICD-10-CM | POA: Diagnosis not present

## 2022-07-11 DIAGNOSIS — I110 Hypertensive heart disease with heart failure: Secondary | ICD-10-CM | POA: Diagnosis not present

## 2022-07-11 HISTORY — PX: CATARACT EXTRACTION W/PHACO: SHX586

## 2022-07-11 SURGERY — PHACOEMULSIFICATION, CATARACT, WITH IOL INSERTION
Anesthesia: Monitor Anesthesia Care | Site: Eye | Laterality: Right

## 2022-07-11 MED ORDER — MOXIFLOXACIN HCL 0.5 % OP SOLN
OPHTHALMIC | Status: DC | PRN
  Administered 2022-07-11: 0.2 mL via OPHTHALMIC

## 2022-07-11 MED ORDER — SIGHTPATH DOSE#1 BSS IO SOLN
INTRAOCULAR | Status: DC | PRN
  Administered 2022-07-11: 124 mL via OPHTHALMIC

## 2022-07-11 MED ORDER — FENTANYL CITRATE (PF) 100 MCG/2ML IJ SOLN
INTRAMUSCULAR | Status: DC | PRN
  Administered 2022-07-11: 2 ug via INTRAVENOUS

## 2022-07-11 MED ORDER — TETRACAINE HCL 0.5 % OP SOLN
1.0000 [drp] | OPHTHALMIC | Status: DC | PRN
  Administered 2022-07-11 (×3): 1 [drp] via OPHTHALMIC

## 2022-07-11 MED ORDER — MIDAZOLAM HCL 2 MG/2ML IJ SOLN
INTRAMUSCULAR | Status: DC | PRN
  Administered 2022-07-11: 2 mg via INTRAVENOUS

## 2022-07-11 MED ORDER — SIGHTPATH DOSE#1 NA HYALUR & NA CHOND-NA HYALUR IO KIT
PACK | INTRAOCULAR | Status: DC | PRN
  Administered 2022-07-11: 1 via OPHTHALMIC

## 2022-07-11 MED ORDER — SIGHTPATH DOSE#1 BSS IO SOLN
INTRAOCULAR | Status: DC | PRN
  Administered 2022-07-11: 15 mL

## 2022-07-11 MED ORDER — ARMC OPHTHALMIC DILATING DROPS
1.0000 | OPHTHALMIC | Status: DC | PRN
  Administered 2022-07-11 (×3): 1 via OPHTHALMIC

## 2022-07-11 MED ORDER — LIDOCAINE HCL (PF) 2 % IJ SOLN
INTRAOCULAR | Status: DC | PRN
  Administered 2022-07-11: 1 mL via INTRAOCULAR

## 2022-07-11 SURGICAL SUPPLY — 20 items
CANNULA ANT/CHMB 27G (MISCELLANEOUS) IMPLANT
CANNULA ANT/CHMB 27GA (MISCELLANEOUS) IMPLANT
CATARACT SUITE SIGHTPATH (MISCELLANEOUS) ×1 IMPLANT
DISSECTOR HYDRO NUCLEUS 50X22 (MISCELLANEOUS) ×1 IMPLANT
FEE CATARACT SUITE SIGHTPATH (MISCELLANEOUS) ×1 IMPLANT
GLOVE SURG GAMMEX PI TX LF 7.5 (GLOVE) ×1 IMPLANT
GLOVE SURG SYN 8.5  E (GLOVE) ×1
GLOVE SURG SYN 8.5 E (GLOVE) ×1 IMPLANT
GLOVE SURG SYN 8.5 PF PI (GLOVE) ×1 IMPLANT
KNIFE SIDECUT EYE (MISCELLANEOUS) IMPLANT
LENS IOL TECNIS EYHANCE 18.0 (Intraocular Lens) IMPLANT
NDL FILTER BLUNT 18X1 1/2 (NEEDLE) ×1 IMPLANT
NEEDLE FILTER BLUNT 18X1 1/2 (NEEDLE) ×1 IMPLANT
PACK VIT ANT 23G (MISCELLANEOUS) IMPLANT
RING MALYGIN (MISCELLANEOUS) IMPLANT
SUT ETHILON 10-0 CS-B-6CS-B-6 (SUTURE)
SUTURE EHLN 10-0 CS-B-6CS-B-6 (SUTURE) IMPLANT
SYR 3ML LL SCALE MARK (SYRINGE) ×1 IMPLANT
SYR 5ML LL (SYRINGE) ×1 IMPLANT
WATER STERILE IRR 250ML POUR (IV SOLUTION) ×1 IMPLANT

## 2022-07-11 NOTE — H&P (Signed)
[facility]   Primary Care Physician:  Tonia Ghent, MD Ophthalmologist: Dr. Benay Pillow  Pre-Procedure History & Physical: HPI:  Ryan Blevins is a 71 y.o. male here for cataract surgery.   Past Medical History:  Diagnosis Date   Actinic keratosis 04/18/2018   Left nasal ala. Hypertrophic   Basal cell carcinoma 02/25/2010   Left med. pretibial. Superficial with focal infiltration.   Basal cell carcinoma 04/29/2013   Right superior helix. Excised: 06/12/2013   Basal cell carcinoma 11/09/2020   left medial upper eyelid - MOHs   Depression    Dysplastic nevus 08/19/2015   Left med medial calf. DN with severe atypia, lateral margin involved. Excised: 09/01/2015, margins free.   Hemorrhoid    Hyperlipidemia    Hypertension    Hyperuricemia    Low testosterone 2011   Prostate cancer ([facility]) 2011   per Dr. Risa Grill.  Seed implant per Dr. Valere Dross   Skin cancer    Squamous cell carcinoma of skin 11/24/2021   Right Ear at the superior antihelix - tx with ED&C   Squamous cell carcinoma of skin 01/24/2022   right hand dorsum, EDC   Syncope    Wears hearing aid in both ears     Past Surgical History:  Procedure Laterality Date   CATARACT EXTRACTION W/PHACO Left 03/28/2022   Procedure: CATARACT EXTRACTION PHACO AND INTRAOCULAR LENS PLACEMENT (IOC) LEFT eyhance toric lens 4.89 00:43.1;  Surgeon: Eulogio Bear, MD;  Location: [facility];  Service: Ophthalmology;  Laterality: Left;   COLONOSCOPY  02/20/2009   Dr Bary Castilla   COLONOSCOPY WITH PROPOFOL N/A 10/24/2018   Procedure: COLONOSCOPY WITH PROPOFOL;  Surgeon: [name], MD;  Location: [facility] ENDOSCOPY;  Service: Endoscopy;  Laterality: N/A;   COLONOSCOPY WITH PROPOFOL N/A 11/10/2021   Procedure: COLONOSCOPY WITH PROPOFOL;  Surgeon: [name], MD;  Location: [facility] ENDOSCOPY;  Service: Endoscopy;  Laterality: N/A;    Prior to Admission medications   Medication Sig Start Date End Date Taking?  Authorizing Provider  ASPIRIN 81 PO Take by mouth daily.   Yes [provider]  Clobetasol Propionate 0.05 % shampoo Massage into scalp and let sit a few minutes before rinsing 5 times weekly as directed. Once improved may decrease to 2-3 times a week. 07/04/22  Yes Ralene Bathe, MD  lisinopril (ZESTRIL) 30 MG tablet Take 1 tablet (30 mg total) by mouth daily. 09/21/21  Yes Tonia Ghent, MD  rosuvastatin (CRESTOR) 10 MG tablet 1 tab daily. 09/21/21  Yes Tonia Ghent, MD  sildenafil (REVATIO) 20 MG tablet Take 1-3 tablets (20-60 mg total) by mouth daily as needed. 09/21/21  Yes Tonia Ghent, MD  venlafaxine XR (EFFEXOR-XR) 75 MG 24 hr capsule Take 2 capsules (150 mg total) by mouth daily. 09/21/21  Yes Tonia Ghent, MD  cetirizine (ZYRTEC) 10 MG tablet Take 10 mg by mouth daily. Patient not taking: Reported on 06/20/2022    [provider]  colchicine 0.6 MG tablet Take 1 tablet (0.6 mg total) by mouth daily as needed. Patient not taking: Reported on 06/20/2022 09/21/21   Tonia Ghent, MD  Efinaconazole (JUBLIA) 10 % SOLN Apply 1 Application topically at bedtime. Apply to affected toenails qhs Patient not taking: Reported on 06/20/2022 04/19/22   Ralene Bathe, MD  fluticasone [facility]) 50 MCG/ACT nasal spray SHAKE LIQUID AND USE 2 SPRAYS IN [facility] NOSTRIL DAILY Patient not taking: Reported on 06/20/2022 09/21/21   Elsie Stain  S, MD  omeprazole (PRILOSEC) 20 MG capsule Take 1 capsule (20 mg total) by mouth daily as needed. Patient not taking: Reported on 03/17/2022 09/21/21   Tonia Ghent, MD  tamsulosin [facility]) 0.4 MG CAPS capsule Take 1 capsule (0.4 mg total) by mouth daily. Patient not taking: Reported on 06/20/2022 09/21/21   Tonia Ghent, MD    Allergies as of 06/17/2022 - Review Complete 04/19/2022  Allergen Reaction Noted   Atorvastatin  04/15/2010   Bupropion hcl  04/15/2010   Codeine Nausea And Vomiting 04/15/2010    Family History  Problem  Relation Age of Onset   Heart disease Mother        Multiple heart surgeries, valve repair   Osteoporosis Mother        Vertebral fracture   Stroke Mother    Hypertension Father    Hyperlipidemia Father    Diabetes Father    Parkinson's disease Father    Colon cancer Neg Hx     Social History   Socioeconomic History   Marital status: Married    Spouse name: Not on file   Number of children: Not on file   Years of education: Not on file   Highest education level: Not on file  Occupational History   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  [employer] grad, doctorate in Counseling, [place]. Worked in [employer].   [place]   Works in [employer] 2012, in relationship for 20+ years   Social Determinants of Radio broadcast assistant Strain: Not on [employer] Insecurity: Not on file  Transportation Needs: Not on file  Physical Activity: Not on file  Stress: Not on file  Social Connections: Not on file  Intimate Partner Violence: Not on file    Review of Systems: See HPI, otherwise negative ROS  Physical Exam: BP (!) 162/86   Pulse 73   Temp (!) 97.3 F (36.3 C) (Temporal)   Resp 18   Ht 6' (1.829 m)   Wt 90.3 kg   SpO2 98%   BMI 26.99 kg/m  General:   Alert, cooperative in NAD Head:  Normocephalic and atraumatic. Respiratory:  Normal work of breathing. Cardiovascular:  RRR  Impression/Plan: Ryan Blevins is here for cataract surgery.  Risks, benefits, limitations, and alternatives regarding cataract surgery have been reviewed with the  patient.  Questions have been answered.  All parties agreeable.   Benay Pillow, MD  07/11/2022, 11:02 AM

## 2022-07-11 NOTE — Transfer of Care (Signed)
Immediate Anesthesia Transfer of Care Note  Patient: Ryan Blevins  Procedure(s) Performed: CATARACT EXTRACTION PHACO AND INTRAOCULAR LENS PLACEMENT (IOC) RIGHT (Right: Eye)  Patient Location: PACU  Anesthesia Type: MAC  Level of Consciousness: awake, alert  and patient cooperative  Airway and Oxygen Therapy: Patient Spontanous Breathing and Patient connected to supplemental oxygen  Post-op Assessment: Post-op Vital signs reviewed, Patient's Cardiovascular Status Stable, Respiratory Function Stable, Patent Airway and No signs of Nausea or vomiting  Post-op Vital Signs: Reviewed and stable  Complications: No notable events documented.

## 2022-07-11 NOTE — Anesthesia Postprocedure Evaluation (Signed)
Anesthesia Post Note  Patient: Ryan Blevins  Procedure(s) Performed: CATARACT EXTRACTION PHACO AND INTRAOCULAR LENS PLACEMENT (IOC) RIGHT (Right: Eye)  Patient location during evaluation: PACU Anesthesia Type: MAC Level of consciousness: awake and alert Pain management: pain level controlled Vital Signs Assessment: post-procedure vital signs reviewed and stable Respiratory status: spontaneous breathing, nonlabored ventilation and respiratory function stable Cardiovascular status: blood pressure returned to baseline and stable Postop Assessment: no apparent nausea or vomiting Anesthetic complications: no   No notable events documented.   Last Vitals:  Vitals:   07/11/22 1135 07/11/22 1140  BP: (!) 136/103 139/86  Pulse: 81 78  Resp: 14 17  Temp: 36.4 C 36.4 C  SpO2: 97% 96%    Last Pain:  Vitals:   07/11/22 1140  TempSrc:   PainSc: 0-No pain                 Iran Ouch

## 2022-07-11 NOTE — Op Note (Signed)
OPERATIVE NOTE  Ryan Blevins [id number] 07/11/2022   PREOPERATIVE DIAGNOSIS:  Nuclear sclerotic cataract right eye.  H25.11   POSTOPERATIVE DIAGNOSIS:    Nuclear sclerotic cataract right eye.     PROCEDURE:  Phacoemusification with posterior chamber intraocular lens placement of the right eye   LENS:   Implant Name Type Inv. Item Serial No. Manufacturer Lot No. LRB No. Used Action  LENS IOL TECNIS EYHANCE 18.0 - [id number] Intraocular Lens LENS IOL TECNIS EYHANCE 18.0 [id number] SIGHTPATH  Right 1 Implanted       Procedure(s) with comments: CATARACT EXTRACTION PHACO AND INTRAOCULAR LENS PLACEMENT (IOC) RIGHT (Right) - 6.78 0:52.8  DIB00 +18.0   SURGEON:  Benay Pillow, MD, MPH  ANESTHESIOLOGIST: Anesthesiologist: Iran Ouch, MD CRNA: Tobie Poet, CRNA   ANESTHESIA:  Topical with tetracaine drops augmented with 1% preservative-free intracameral lidocaine.  ESTIMATED BLOOD LOSS: less than 1 mL.   COMPLICATIONS:  None.   DESCRIPTION OF PROCEDURE:  The patient was identified in the holding room and transported to the operating room and placed in the supine position under the operating microscope.  The right eye was identified as the operative eye and it was prepped and draped in the usual sterile ophthalmic fashion.   A 1.0 millimeter clear-corneal paracentesis was made at the 10:30 position. 0.5 ml of preservative-free 1% lidocaine with epinephrine was injected into the anterior chamber.  The anterior chamber was filled with viscoelastic.  A 2.4 millimeter keratome was used to make a near-clear corneal incision at the 8:00 position.  A curvilinear capsulorrhexis was made with a cystotome and capsulorrhexis forceps.  Balanced salt solution was used to hydrodissect and hydrodelineate the nucleus.   Phacoemulsification was then used in stop and chop fashion to remove the lens nucleus and epinucleus.  The remaining cortex was then removed using the irrigation  and aspiration handpiece. Viscoelastic was then placed into the capsular bag to distend it for lens placement.  A lens was then injected into the capsular bag.  The remaining viscoelastic was aspirated.   Wounds were hydrated with balanced salt solution.  The anterior chamber was inflated to a physiologic pressure with balanced salt solution.   Intracameral vigamox 0.1 mL undiluted was injected into the eye and a drop placed onto the ocular surface.  No wound leaks were noted.  The patient was taken to the recovery room in stable condition without complications of anesthesia or surgery  Benay Pillow 07/11/2022, 11:34 AM

## 2022-07-12 ENCOUNTER — Encounter: Payer: Self-pay | Admitting: Ophthalmology

## 2022-07-15 ENCOUNTER — Encounter: Payer: Self-pay | Admitting: Dermatology

## 2022-07-20 ENCOUNTER — Ambulatory Visit: Payer: PPO | Admitting: Dermatology

## 2022-07-29 MED ORDER — CELECOXIB 200 MG PO CAPS
200 mg | ORAL_CAPSULE | Freq: Two times a day (BID) | ORAL | 2 refills
Start: 2022-07-29 — End: ?

## 2022-08-04 MED ORDER — CELECOXIB 200 MG PO CAPS
200 mg | ORAL_CAPSULE | Freq: Two times a day (BID) | ORAL | 2 refills
Start: 2022-08-04 — End: ?

## 2022-08-05 MED ORDER — CELECOXIB 200 MG PO CAPS
200 mg | ORAL_CAPSULE | Freq: Two times a day (BID) | ORAL | 2 refills | Status: AC
Start: 2022-08-05 — End: ?

## 2022-10-04 ENCOUNTER — Encounter: Payer: Self-pay | Admitting: Dermatology

## 2022-10-04 ENCOUNTER — Ambulatory Visit: Payer: PPO | Admitting: Dermatology

## 2022-10-04 DIAGNOSIS — L409 Psoriasis, unspecified: Secondary | ICD-10-CM | POA: Diagnosis not present

## 2022-10-04 DIAGNOSIS — Z7189 Other specified counseling: Secondary | ICD-10-CM

## 2022-10-04 DIAGNOSIS — D492 Neoplasm of unspecified behavior of bone, soft tissue, and skin: Secondary | ICD-10-CM | POA: Diagnosis not present

## 2022-10-04 MED ORDER — KETOCONAZOLE 2 % EX SHAM: MEDICATED_SHAMPOO | CUTANEOUS | 6 refills | Status: DC

## 2022-10-04 MED ORDER — CLOBETASOL PROPIONATE 0.05 % EX FOAM: CUTANEOUS | 0 refills | Status: DC

## 2022-10-04 NOTE — Patient Instructions (Addendum)
[place], [phone], [place] [address] Phone: [phone]      For itch at forehead, recommend OTC Gold Bond Rapid Relief Anti-Itch cream (pramoxine + menthol), CeraVe Anti-itch cream or lotion (pramoxine), Sarna lotion (Original- menthol + camphor or Sensitive- pramoxine) or Eucerin 12 hour Itch Relief lotion (menthol) up to 3 times per day to areas on body that are itchy.   For scalp Alternate T-Sal or [place] Blue Naturals (salicylic acid shampoo) with ketoconazole shampoo. Leave in 10 minutes.  Daily apply zoryve cream (a foam version is coming out soon, check back in a couple of weeks)  Twice a day until follow-up apply clobetasol foam. Avoid applying to face, groin, and axilla. Use as directed. Long-term use can cause thinning of the skin.  Topical steroids (such as triamcinolone, fluocinolone, fluocinonide, mometasone, clobetasol, halobetasol, betamethasone, hydrocortisone) can cause thinning and lightening of the skin if they are used for too long in the same area. Your physician has selected the right strength medicine for your problem and area affected on the body. Please use your medication only as directed by your physician to prevent side effects.   Due to recent changes in healthcare laws, you may see results of your pathology and/or laboratory studies on MyChart before the doctors have had a chance to review them. We understand that in some cases there may be results that are confusing or concerning to you. Please understand that not all results are received at the same time and often the doctors may need to interpret multiple results in order to provide you with the best plan of care or course of treatment. Therefore, we ask that you please give [place] 2 business days to thoroughly review all your results before contacting the office for clarification. Should we see a critical lab result, you will be contacted sooner.   If  You Need Anything After Your Visit  If you have any questions or concerns for your doctor, please call our main line at [phone] and press option 4 to reach your doctor's medical assistant. If no one answers, please leave a voicemail as directed and we will return your call as soon as possible. Messages left after 4 pm will be answered the following business day.   You may also send [place] a message via [place]. We typically respond to MyChart messages within 1-2 business days.  For prescription refills, please ask your pharmacy to contact our office. Our fax number is [phone].  If you have an urgent issue when the clinic is closed that cannot wait until the next business day, you can page your doctor at the number below.    Please note that while we do our best to be available for urgent issues outside of office hours, we are not available 24/7.   If you have an urgent issue and are unable to reach [place], you may choose to seek medical care at your doctor's office, retail clinic, urgent care center, or emergency room.  If you have a medical emergency, please immediately call 911 or go to the emergency department.  Pager Numbers  - Dr. Nehemiah Massed: [phone]  - Dr. Laurence Ferrari: [phone]  - Dr. Nicole Kindred: [phone]  In the event of inclement weather, please call our main line at [phone] for an update on the status of any delays or closures.  Dermatology Medication Tips: Please keep the boxes that topical medications come in in order to help keep track  of the instructions about where and how to use these. Pharmacies typically print the medication instructions only on the boxes and not directly on the medication tubes.   If your medication is too expensive, please contact our office at [phone] option 4 or send [place] a message through [place].   We are unable to tell what your co-pay for medications will be in advance as this is different depending on your insurance coverage.  However, we may be able to find a substitute medication at lower cost or fill out paperwork to get insurance to cover a needed medication.   If a prior authorization is required to get your medication covered by your insurance company, please allow [place] 1-2 business days to complete this process.  Drug prices often vary depending on where the prescription is filled and some pharmacies may offer cheaper prices.  The website www.goodrx.com contains coupons for medications through different pharmacies. The prices here do not account for what the cost may be with help from insurance (it may be cheaper with your insurance), but the website can give you the price if you did not use any insurance.  - You can print the associated coupon and take it with your prescription to the pharmacy.  - You may also stop by our office during regular business hours and pick up a GoodRx coupon card.  - If you need your prescription sent electronically to a different pharmacy, notify our office through [facility] or by phone at [phone] option 4.     Si Usted Necesita Algo Despus de Su Visita  Tambin puede enviarnos un mensaje a travs de Pharmacist, community. Por lo general respondemos a los mensajes de MyChart en el transcurso de 1 a 2 das hbiles.  Para renovar recetas, por favor pida a su farmacia que se ponga en contacto con nuestra oficina. Harland Dingwall de fax es [place] [phone].  Si tiene un asunto urgente cuando la clnica est cerrada y que no puede esperar hasta el siguiente da hbil, puede llamar/localizar a su doctor(a) al nmero que aparece a continuacin.   Por favor, tenga en cuenta que aunque hacemos todo lo posible para estar disponibles para asuntos urgentes fuera del horario de [place], no estamos disponibles las 24 horas del da, los 7 das de la R[place].   Si tiene un problema urgente y no puede comunicarse con nosotros, puede optar por buscar atencin mdica  en el consultorio de su  doctor(a), en una clnica privada, en un centro de atencin urgente o en una sala de emergencias.  Si tiene Engineering geologist, por favor llame inmediatamente al 911 o vaya a la sala de emergencias.  Nmeros de bper  - Dr. Nehemiah Massed: [phone]  - Dra. Moye: [phone]  - Dra. Nicole Kindred: [phone]  En caso de inclemencias del [place], por favor llame a Johnsie Kindred principal al [phone] para una actualizacin sobre el [place] de cualquier retraso o cierre.  Consejos para la medicacin en dermatologa: Por favor, guarde las cajas en las que vienen los medicamentos de uso tpico para ayudarle a seguir las instrucciones sobre dnde y cmo usarlos. Las farmacias generalmente imprimen las instrucciones del medicamento slo en las cajas y no directamente en los tubos del [place].   Si su medicamento es muy caro, por favor, pngase en contacto con Zigmund Daniel llamando al [phone] y presione la opcin 4 o envenos un mensaje a travs de Pharmacist, community.   No podemos decirle cul ser su copago por los medicamentos por  adelantado ya que esto es diferente dependiendo de la cobertura de su seguro. Sin embargo, es posible que podamos encontrar un medicamento sustituto a Electrical engineer un formulario para que el seguro cubra el medicamento que se considera necesario.   Si se requiere una autorizacin previa para que su compaa de seguros [place] su medicamento, por favor permtanos de 1 a 2 das hbiles para completar este proceso.  Los precios de los medicamentos varan con frecuencia dependiendo del Environmental consultant de dnde se surte la receta y alguna farmacias pueden ofrecer precios ms baratos.  El sitio web www.goodrx.com tiene cupones para medicamentos de Airline pilot. Los precios aqu no tienen en cuenta lo que podra costar con la ayuda del seguro (puede ser ms barato con su seguro), pero el sitio web puede darle el precio si no utiliz Research scientist (physical sciences).  - Puede imprimir el cupn  correspondiente y llevarlo con su receta a la farmacia.  - Tambin puede pasar por nuestra oficina durante el horario de atencin regular y Charity fundraiser una tarjeta de cupones de GoodRx.  - Si necesita que su receta se enve electrnicamente a una farmacia diferente, informe a nuestra oficina a travs de MyChart de [place] o por telfono llamando al [phone] y presione la opcin 4.

## 2022-10-04 NOTE — Progress Notes (Signed)
   Follow-Up Visit   Subjective  Ryan Blevins is a 72 y.o. male who presents for the following: Dermatitis (3 months f/u on itchy rash on his scalp, treating with Clobetasol shampoo with a poor response, ).  The following portions of the chart were reviewed this encounter and updated as appropriate:   Tobacco  Allergies  Meds  Problems  Med Hx  Surg Hx  Fam Hx      Review of Systems:  No other skin or systemic complaints except as noted in HPI or Assessment and Plan.  Objective  Well appearing patient in no apparent distress; mood and affect are within normal limits.  A focused examination was performed including scalp, face. Relevant physical exam findings are noted in the Assessment and Plan.  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 Condition is bothersome/symptomatic for patient. Currently flared.  Denies joint pain  Counseling on psoriasis and coordination of care  psoriasis is a chronic non-curable, but treatable genetic/hereditary disease that may have other systemic features affecting other organ systems such as joints (Psoriatic Arthritis). It is associated with an increased risk of inflammatory bowel disease, heart disease, non-alcoholic fatty liver disease, and depression.  Treatments include light and laser treatments; topical medications; and systemic medications including oral and injectables.    Start salicylic acid shampoo T-sal apply three times per week, massage into scalp and leave in for 10 minutes before rinsing out  Start Ketoconazole shampoo apply three times per week, massage into scalp and leave in for 10 minutes before rinsing out  Start Clobetasol foam apply to scalp twice a day  until follolw-up. Avoid applying to face, groin, and axilla. Use as directed. Long-term use can cause thinning of the skin.    Samples of Zoryve cream given use once a day Lot TPBM  Exp 08/2023  Plan on Zoryve foam once it is available, will plan on giving patient samples of Zoryve foam at his next office visit in 3 weeks   In future consider [place] Nail, Sotyktu or biologic, or consider Xtrac laser   ketoconazole (NIZORAL) 2 % shampoo - Scalp apply three times per week, massage into scalp and leave in for 10 minutes before rinsing out  Related Medications clobetasol (OLUX) 0.05 % topical foam Apply to scalp twice a day x 2 weeks then decrease to 5 nights a week prn, Avoid applying to face, groin, and axilla. Use as directed. Long-term use can cause thinning of the skin.  Neoplasm of skin Head - Anterior (Face)  Favor lipomas, occasionally symptomatic with itch. Call to re-evaluate with any changes.  Recommend OTC Gold Bond Rapid Relief Anti-Itch cream (pramoxine + menthol), CeraVe Anti-itch cream or lotion (pramoxine), Sarna lotion (Original- menthol + camphor or Sensitive- pramoxine) or Eucerin 12 hour Itch Relief lotion (menthol) up to 3 times per day to areas on body that are itchy.    Return for as scheduled 10/25/2022 .  I, Marye Round, CMA, am acting as scribe for Forest Gleason, MD .   Documentation: I have reviewed the above documentation for accuracy and completeness, and I agree with the above.  Forest Gleason, MD

## 2022-10-05 ENCOUNTER — Other Ambulatory Visit: Payer: Self-pay | Admitting: Family Medicine

## 2022-10-05 DIAGNOSIS — E790 Hyperuricemia without signs of inflammatory arthritis and tophaceous disease: Secondary | ICD-10-CM

## 2022-10-05 DIAGNOSIS — E782 Mixed hyperlipidemia: Secondary | ICD-10-CM

## 2022-10-05 DIAGNOSIS — Z125 Encounter for screening for malignant neoplasm of prostate: Secondary | ICD-10-CM

## 2022-10-10 ENCOUNTER — Ambulatory Visit (INDEPENDENT_AMBULATORY_CARE_PROVIDER_SITE_OTHER): Payer: PPO

## 2022-10-10 ENCOUNTER — Other Ambulatory Visit (INDEPENDENT_AMBULATORY_CARE_PROVIDER_SITE_OTHER): Payer: PPO

## 2022-10-10 ENCOUNTER — Other Ambulatory Visit: Payer: Self-pay | Admitting: Family Medicine

## 2022-10-10 VITALS — Wt 199.0 lb

## 2022-10-10 DIAGNOSIS — E782 Mixed hyperlipidemia: Secondary | ICD-10-CM

## 2022-10-10 DIAGNOSIS — Z0000 Encounter for general adult medical examination without abnormal findings: Secondary | ICD-10-CM

## 2022-10-10 DIAGNOSIS — E790 Hyperuricemia without signs of inflammatory arthritis and tophaceous disease: Secondary | ICD-10-CM | POA: Diagnosis not present

## 2022-10-10 DIAGNOSIS — Z125 Encounter for screening for malignant neoplasm of prostate: Secondary | ICD-10-CM

## 2022-10-10 LAB — LIPID PANEL
Cholesterol: 146 mg/dL (ref 0–200)
HDL: 37 mg/dL — ABNORMAL LOW (ref >39.00)
LDL Cholesterol: 70 mg/dL (ref 0–99)
NonHDL: 109.47
Total CHOL/HDL Ratio: 4
Triglycerides: 196 mg/dL — ABNORMAL HIGH (ref 0.0–149.0)
VLDL: 39.2 mg/dL (ref 0.0–40.0)

## 2022-10-10 LAB — COMPREHENSIVE METABOLIC PANEL
ALT: 17 U/L (ref 0–53)
AST: 18 U/L (ref 0–37)
Albumin: 4.3 g/dL (ref 3.5–5.2)
Alkaline Phosphatase: 79 U/L (ref 39–117)
BUN: 10 mg/dL (ref 6–23)
CO2: 29 mEq/L (ref 19–32)
Calcium: 9.4 mg/dL (ref 8.4–10.5)
Chloride: 103 mEq/L (ref 96–112)
Creatinine, Ser: 1.01 mg/dL (ref 0.40–1.50)
GFR: 74.95 mL/min (ref >60.00)
Glucose, Bld: 111 mg/dL — ABNORMAL HIGH (ref 70–99)
Potassium: 4.7 mEq/L (ref 3.5–5.1)
Sodium: 140 mEq/L (ref 135–145)
Total Bilirubin: 0.4 mg/dL (ref 0.2–1.2)
Total Protein: 6.8 g/dL (ref 6.0–8.3)

## 2022-10-10 LAB — URIC ACID: Uric Acid, Serum: 8.2 mg/dL — ABNORMAL HIGH (ref 4.0–7.8)

## 2022-10-10 LAB — PSA, MEDICARE: PSA: 0 ng/ml — ABNORMAL LOW (ref 0.10–4.00)

## 2022-10-10 NOTE — Patient Instructions (Signed)
Ryan Blevins , Thank you for taking time to come for your Medicare Wellness Visit. I appreciate your ongoing commitment to your health goals. Please review the following plan we discussed and let me know if I can assist you in the future.   These are the goals we discussed:  Goals      DIET - EAT MORE FRUITS AND VEGETABLES        This is a list of the screening recommended for you and due dates:  Health Maintenance  Topic Date Due   DTaP/Tdap/Td vaccine (2 - Tdap) 08/16/2019   Flu Shot  04/19/2022   COVID-19 Vaccine (5 - 2023-24 season) 05/20/2022   Medicare Annual Wellness Visit  10/11/2023   Colon Cancer Screening  11/10/2024   Pneumonia Vaccine  Completed   Hepatitis C Screening: USPSTF Recommendation to screen - Ages 18-79 yo.  Completed   Zoster (Shingles) Vaccine  Completed   HPV Vaccine  Aged Out    Advanced directives: no  Conditions/risks identified: none  Next appointment: Follow up in one year for your annual wellness visit. 10/12/23 @ 10 am by phone  Preventive Care 65 Years and Older, Male  Preventive care refers to lifestyle choices and visits with your health care provider that can promote health and wellness. What does preventive care include? A yearly physical exam. This is also called an annual well check. Dental exams once or twice a year. Routine eye exams. Ask your health care provider how often you should have your eyes checked. Personal lifestyle choices, including: Daily care of your teeth and gums. Regular physical activity. Eating a healthy diet. Avoiding tobacco and drug use. Limiting alcohol use. Practicing safe sex. Taking low doses of aspirin every day. Taking vitamin and mineral supplements as recommended by your health care provider. What happens during an annual well check? The services and screenings done by your health care provider during your annual well check will depend on your age, overall health, lifestyle risk factors, and family  history of disease. Counseling  Your health care provider may ask you questions about your: Alcohol use. Tobacco use. Drug use. Emotional well-being. Home and relationship well-being. Sexual activity. Eating habits. History of falls. Memory and ability to understand (cognition). Work and work Statistician. Screening  You may have the following tests or measurements: Height, weight, and BMI. Blood pressure. Lipid and cholesterol levels. These may be checked every 5 years, or more frequently if you are over 11 years old. Skin check. Lung cancer screening. You may have this screening every year starting at age 77 if you have a 30-pack-year history of smoking and currently smoke or have quit within the past 15 years. Fecal occult blood test (FOBT) of the stool. You may have this test every year starting at age 6. Flexible sigmoidoscopy or colonoscopy. You may have a sigmoidoscopy every 5 years or a colonoscopy every 10 years starting at age 58. Prostate cancer screening. Recommendations will vary depending on your family history and other risks. Hepatitis C blood test. Hepatitis B blood test. Sexually transmitted disease (STD) testing. Diabetes screening. This is done by checking your blood sugar (glucose) after you have not eaten for a while (fasting). You may have this done every 1-3 years. Abdominal aortic aneurysm (AAA) screening. You may need this if you are a current or former smoker. Osteoporosis. You may be screened starting at age 46 if you are at high risk. Talk with your health care provider about your test results, treatment  options, and if necessary, the need for more tests. Vaccines  Your health care provider may recommend certain vaccines, such as: Influenza vaccine. This is recommended every year. Tetanus, diphtheria, and acellular pertussis (Tdap, Td) vaccine. You may need a Td booster every 10 years. Zoster vaccine. You may need this after age 72. Pneumococcal  13-valent conjugate (PCV13) vaccine. One dose is recommended after age 51. Pneumococcal polysaccharide (PPSV23) vaccine. One dose is recommended after age 33. Talk to your health care provider about which screenings and vaccines you need and how often you need them. This information is not intended to replace advice given to you by your health care provider. Make sure you discuss any questions you have with your health care provider. Document Released: 10/02/2015 Document Revised: 05/25/2016 Document Reviewed: 07/07/2015 Elsevier Interactive Patient Education  2017 [place] Prevention in the Home Falls can cause injuries. They can happen to people of all ages. There are many things you can do to make your home safe and to help prevent falls. What can I do on the outside of my home? Regularly fix the edges of walkways and driveways and fix any cracks. Remove anything that might make you trip as you walk through a door, such as a raised step or threshold. Trim any bushes or trees on the path to your home. Use bright outdoor lighting. Clear any walking paths of anything that might make someone trip, such as rocks or tools. Regularly check to see if handrails are loose or broken. Make sure that both sides of any steps have handrails. Any raised decks and porches should have guardrails on the edges. Have any leaves, snow, or ice cleared regularly. Use sand or salt on walking paths during winter. Clean up any spills in your garage right away. This includes oil or grease spills. What can I do in the bathroom? Use night lights. Install grab bars by the toilet and in the tub and shower. Do not use towel bars as grab bars. Use non-skid mats or decals in the tub or shower. If you need to sit down in the shower, use a plastic, non-slip stool. Keep the floor dry. Clean up any water that spills on the floor as soon as it happens. Remove soap buildup in the tub or shower regularly. Attach  bath mats securely with double-sided non-slip rug tape. Do not have throw rugs and other things on the floor that can make you trip. What can I do in the bedroom? Use night lights. Make sure that you have a light by your bed that is easy to reach. Do not use any sheets or blankets that are too big for your bed. They should not hang down onto the floor. Have a firm chair that has side arms. You can use this for support while you get dressed. Do not have throw rugs and other things on the floor that can make you trip. What can I do in the kitchen? Clean up any spills right away. Avoid walking on wet floors. Keep items that you use a lot in easy-to-reach places. If you need to reach something above you, use a strong step stool that has a grab bar. Keep electrical cords out of the way. Do not use floor polish or wax that makes floors slippery. If you must use wax, use non-skid floor wax. Do not have throw rugs and other things on the floor that can make you trip. What can I do with my stairs? Do not  leave any items on the stairs. Make sure that there are handrails on both sides of the stairs and use them. Fix handrails that are broken or loose. Make sure that handrails are as long as the stairways. Check any carpeting to make sure that it is firmly attached to the stairs. Fix any carpet that is loose or worn. Avoid having throw rugs at the top or bottom of the stairs. If you do have throw rugs, attach them to the floor with carpet tape. Make sure that you have a light switch at the top of the stairs and the bottom of the stairs. If you do not have them, ask someone to add them for you. What else can I do to help prevent falls? Wear shoes that: Do not have high heels. Have rubber bottoms. Are comfortable and fit you well. Are closed at the toe. Do not wear sandals. If you use a stepladder: Make sure that it is fully opened. Do not climb a closed stepladder. Make sure that both sides of the  stepladder are locked into place. Ask someone to hold it for you, if possible. Clearly mark and make sure that you can see: Any grab bars or handrails. First and last steps. Where the edge of each step is. Use tools that help you move around (mobility aids) if they are needed. These include: Canes. Walkers. Scooters. Crutches. Turn on the lights when you go into a dark area. Replace any light bulbs as soon as they burn out. Set up your furniture so you have a clear path. Avoid moving your furniture around. If any of your floors are uneven, fix them. If there are any pets around you, be aware of where they are. Review your medicines with your doctor. Some medicines can make you feel dizzy. This can increase your chance of falling. Ask your doctor what other things that you can do to help prevent falls. This information is not intended to replace advice given to you by your health care provider. Make sure you discuss any questions you have with your health care provider. Document Released: 07/02/2009 Document Revised: 02/11/2016 Document Reviewed: 10/10/2014 Elsevier Interactive Patient Education  2017 [employer].

## 2022-10-10 NOTE — Progress Notes (Signed)
Virtual Visit via Telephone Note  I connected with  Ryan Blevins on 10/10/22 at 10:45 AM EST by telephone and verified that I am speaking with the correct person using two identifiers.  Location: Patient: home Provider: [facility] Persons participating in the virtual visit: [place]   I discussed the limitations, risks, security and privacy concerns of performing an evaluation and management service by telephone and the availability of in person appointments. The patient expressed understanding and agreed to proceed.  Interactive audio and video telecommunications were attempted between this nurse and patient, however failed, due to patient having technical difficulties OR patient did not have access to video capability.  We continued and completed visit with audio only.  Some vital signs may be absent or patient reported.   Ryan David, LPN  Subjective:   Ryan Blevins is a 72 y.o. male who presents for Medicare Annual/Subsequent preventive examination.  Review of Systems     Cardiac Risk Factors include: advanced age (>66mn, >>57women);dyslipidemia;male gender     Objective:    There were no vitals filed for this visit. There is no height or weight on file to calculate BMI.     10/10/2022   10:51 AM 07/11/2022    9:54 AM 11/10/2021    9:13 AM 10/24/2018    8:22 AM 07/26/2017    7:16 AM  Advanced Directives  Does Patient Have a Medical Advance Directive? No Yes Yes Yes No  Type of ACorporate treasurerof AEdcouchLiving will HHelena Valley [place] CentralLiving will HWeldonLiving will   Does patient want to make changes to medical advance directive?  No - Patient declined     Copy of [place] Chart?  No - copy requested     Would patient like information on creating a medical advance directive? No - Patient declined        Current Medications (verified) Outpatient Encounter  Medications as of 10/10/2022  Medication Sig   ASPIRIN 81 PO Take by mouth daily.   cetirizine (ZYRTEC) 10 MG tablet Take 10 mg by mouth daily.   clobetasol (OLUX) 0.05 % topical foam Apply to scalp twice a day x 2 weeks then decrease to 5 nights a week prn, Avoid applying to face, groin, and axilla. Use as directed. Long-term use can cause thinning of the skin.   Clobetasol Propionate 0.05 % shampoo Massage into scalp and let sit a few minutes before rinsing 5 times weekly as directed. Once improved may decrease to 2-3 times a week.   colchicine 0.6 MG tablet Take 1 tablet (0.6 mg total) by mouth daily as needed.   fluticasone (FLONASE) 50 MCG/ACT nasal spray SHAKE LIQUID AND USE 2 SPRAYS IN EACH NOSTRIL DAILY   ketoconazole (NIZORAL) 2 % shampoo apply three times per week, massage into scalp and leave in for 10 minutes before rinsing out   lisinopril (ZESTRIL) 30 MG tablet Take 1 tablet (30 mg total) by mouth daily.   rosuvastatin (CRESTOR) 10 MG tablet 1 tab daily.   sildenafil (REVATIO) 20 MG tablet Take 1-3 tablets (20-60 mg total) by mouth daily as needed.   venlafaxine XR (EFFEXOR-XR) 75 MG 24 hr capsule Take 2 capsules (150 mg total) by mouth daily.   Efinaconazole (JUBLIA) 10 % SOLN Apply 1 Application topically at bedtime. Apply to affected toenails qhs (Patient not taking: Reported on 06/20/2022)   omeprazole (PRILOSEC) 20 MG capsule Take 1 capsule (  20 mg total) by mouth daily as needed. (Patient not taking: Reported on 03/17/2022)   tamsulosin (FLOMAX) 0.4 MG CAPS capsule Take 1 capsule (0.4 mg total) by mouth daily. (Patient not taking: Reported on 06/20/2022)   No facility-administered encounter medications on file as of 10/10/2022.    Allergies (verified) Atorvastatin, Bupropion hcl, and Codeine   History: Past Medical History:  Diagnosis Date   Actinic keratosis 04/18/2018   Left nasal ala. Hypertrophic   Basal cell carcinoma 02/25/2010   Left med. pretibial. Superficial with  focal infiltration.   Basal cell carcinoma 04/29/2013   Right superior helix. Excised: 06/12/2013   Basal cell carcinoma 11/09/2020   left medial upper eyelid - MOHs   Depression    Dysplastic nevus 08/19/2015   Left med medial calf. DN with severe atypia, lateral margin involved. Excised: 09/01/2015, margins free.   Hemorrhoid    Hyperlipidemia    Hypertension    Hyperuricemia    Low testosterone 2011   Prostate cancer ([place]) 2011   per Dr. Risa Grill.  Seed implant per Dr. Valere Dross   Skin cancer    Squamous cell carcinoma of skin 11/24/2021   Right Ear at the superior antihelix - tx with ED&C   Squamous cell carcinoma of skin 01/24/2022   right hand dorsum, EDC   Syncope    Wears hearing aid in both ears    Past Surgical History:  Procedure Laterality Date   CATARACT EXTRACTION W/PHACO Left 03/28/2022   Procedure: CATARACT EXTRACTION PHACO AND INTRAOCULAR LENS PLACEMENT (IOC) LEFT eyhance toric lens 4.89 00:43.1;  Surgeon: Eulogio Bear, MD;  Location: [facility];  Service: Ophthalmology;  Laterality: Left;   CATARACT EXTRACTION W/PHACO Right 07/11/2022   Procedure: CATARACT EXTRACTION PHACO AND INTRAOCULAR LENS PLACEMENT (IOC) RIGHT;  Surgeon: Eulogio Bear, MD;  Location: [facility];  Service: Ophthalmology;  Laterality: Right;  6.78 0:52.8   COLONOSCOPY  02/20/2009   Dr Bary Castilla   COLONOSCOPY WITH PROPOFOL N/A 10/24/2018   Procedure: COLONOSCOPY WITH PROPOFOL;  Surgeon: [name], MD;  Location: [facility] ENDOSCOPY;  Service: Endoscopy;  Laterality: N/A;   COLONOSCOPY WITH PROPOFOL N/A 11/10/2021   Procedure: COLONOSCOPY WITH PROPOFOL;  Surgeon: [name], MD;  Location: [facility] ENDOSCOPY;  Service: Endoscopy;  Laterality: N/A;   Family History  Problem Relation Age of Onset   Heart disease Mother        Multiple heart surgeries, valve repair   Osteoporosis Mother        Vertebral fracture   Stroke Mother    Hypertension Father     Hyperlipidemia Father    Diabetes Father    Parkinson's disease Father    Colon cancer Neg Hx    Social History   Socioeconomic History   Marital status: Married    Spouse name: Not on file   Number of children: Not on file   Years of education: Not on file   Highest education level: Not on file  Occupational History   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  [employer] grad, doctorate in Counseling, [place]. Worked in [employer].   [place]   Works in [employer] 2012, in relationship for 20+ years   Social Determinants of Radio broadcast assistant Strain: Low Risk  (10/10/2022)   Overall Financial Resource Strain (CARDIA)    Difficulty of Paying Living Expenses: Not hard at all  Food Insecurity: No Food Insecurity (10/10/2022)   Hunger Vital Sign    Worried About Running Out of Food in the Last Year: Never true    [place] in the Last Year: Never true  Transportation Needs: No Transportation Needs (10/10/2022)   PRAPARE - Hydrologist (Medical): No    Lack of Transportation (Non-Medical): No  Physical Activity: Inactive (10/10/2022)   Exercise Vital Sign    Days of Exercise per Week: 0 days    Minutes of Exercise per Session: 0 min  Stress: No Stress Concern Present (10/10/2022)   [place]    Feeling of Stress : Only a little  Social Connections: Moderately Integrated (10/10/2022)   Social Connection and Isolation Panel [NHANES]    Frequency of Communication  with Friends and Family: More than three times a week    Frequency of Social Gatherings with Friends and Family: More than three times a week    Attends Religious Services: More than 4 times per year    Active Member of [employer] or Organizations: No    Attends Music therapist: Never    Marital Status: Married    Tobacco Counseling Counseling given: Not Answered   Clinical Intake:  Pre-visit preparation completed: Yes  Pain : No/denies pain     Nutritional Risks: None Diabetes: No  How often do you need to have someone help you when you read instructions, pamphlets, or other written materials from your doctor or pharmacy?: 1 - Never  Diabetic?no  Interpreter Needed?: No  Information entered by :: Kirke Shaggy, LPN   Activities of Daily Living    10/10/2022   10:52 AM 07/11/2022    9:48 AM  In your present state of health, do you have any difficulty performing the following activities:  Hearing? 0 1  Vision? 0 1  Difficulty concentrating or making decisions? 0 0  Walking or climbing stairs? 0 0  Dressing or bathing? 0 0  Doing errands, shopping? 0   Preparing Food and eating ? N   Using the Toilet? N   In the past six months, have you accidently leaked urine? N   Do you have problems with loss of bowel control? N   Managing your Medications? N   Managing your Finances? N   Housekeeping or managing your Housekeeping? N     Patient Care Team: Tonia Ghent, MD as PCP - General (Family Medicine) Rana Snare, MD (Inactive) as Consulting Physician (Urology) Arloa Koh, MD (Inactive) as Consulting Physician (Radiation Oncology) Bary Castilla Forest Gleason, MD (General Surgery) Ardis Hughs, MD as Attending Physician (Urology)  Indicate any recent Medical Services you may have received from other than [facility] providers in the past year (date may be approximate).     Assessment:   This is a routine wellness examination for  [place].  Hearing/Vision screen Hearing Screening - Comments:: Wears aids Vision Screening - Comments:: Wears glasses- Dr.King  Dietary issues and exercise activities discussed: Current Exercise Habits:  The patient does not participate in regular exercise at present   Goals Addressed             This Visit's Progress    DIET - EAT MORE FRUITS AND VEGETABLES         Depression Screen    10/10/2022   10:50 AM 09/21/2021    2:08 PM 09/21/2021   10:59 AM 06/11/2021   11:15 AM 06/11/2021   11:14 AM 10/20/2020   12:58 PM 08/30/2018   10:04 AM  PHQ 2/9 Scores  PHQ - 2 Score 0 0 0 0 0 0 0  PHQ- 9 Score 0 0  0  2     Fall Risk    10/10/2022   10:52 AM 09/21/2021   10:59 AM 10/20/2020   12:10 PM 04/14/2020    2:57 PM 04/18/2019    2:14 PM  Fall Risk   Falls in the past year? 0 0 0 0   Comment    Emmi Telephone Survey: data to providers prior to load [employer] Survey: data to providers prior to load  Number falls in past yr: 0 0 0    Comment     Emmi Telephone Survey Actual Response =   Injury with Fall? 0 0 0    Risk for fall due to : No Fall Risks No Fall Risks     Follow up Falls prevention discussed;Falls evaluation completed Falls evaluation completed Falls evaluation completed      FALL RISK PREVENTION PERTAINING TO THE HOME:  Any stairs in or around the home? Yes  If so, are there any without handrails? No  Home free of loose throw rugs in walkways, pet beds, electrical cords, etc? Yes  Adequate lighting in your home to reduce risk of falls? Yes   ASSISTIVE DEVICES UTILIZED TO PREVENT FALLS:  Life alert? No  Use of a cane, walker or w/c? No  Grab bars in the bathroom? No  Shower chair or bench in shower? No  Elevated toilet seat or a handicapped toilet? No     Cognitive Function:        10/10/2022   10:56 AM  6CIT Screen  What Year? 0 points  What month? 0 points  What time? 0 points  Count back from 20 0 points  Months in reverse 0 points  Repeat  phrase 0 points  Total Score 0 points    Immunizations Immunization History  Administered Date(s) Administered   Fluad Quad(high Dose 65+) 09/19/2019   Influenza Split 06/08/2012   Influenza, High Dose Seasonal PF 06/23/2020, 07/15/2021   Influenza,inj,Quad PF,6+ Mos 08/12/2013, 08/15/2014, 08/17/2015, 08/19/2016, 08/22/2017, 08/30/2018   Moderna Sars-Covid-2 Vaccination 11/01/2019, 11/29/2019, 07/21/2020, 04/09/2021   PNEUMOCOCCAL CONJUGATE-20 07/15/2021   Pneumococcal Conjugate-13 08/19/2016, 08/10/2020   Pneumococcal Polysaccharide-23 09/19/2008, 08/22/2017   Td 08/15/2009   Zoster Recombinat (Shingrix) 04/20/2020, 06/19/2020    TDAP status: Due, Education has been provided regarding the importance of this vaccine. Advised may receive this vaccine at local pharmacy or Health Dept. Aware to provide a copy of the vaccination record if obtained from local pharmacy or Health Dept. Verbalized acceptance and understanding.  Flu Vaccine status: Up to date  Pneumococcal vaccine status: Up to date  Covid-19 vaccine status: Completed vaccines  Qualifies for Shingles Vaccine? Yes   Zostavax completed No   Shingrix Completed?: Yes  Screening Tests Health Maintenance  Topic Date Due   DTaP/Tdap/Td (2 - Tdap) 08/16/2019   INFLUENZA VACCINE  04/19/2022  COVID-19 Vaccine (5 - 2023-24 season) 05/20/2022   Medicare Annual Wellness (AWV)  10/11/2023   COLONOSCOPY (Pts 45-40yr Insurance coverage will need to be confirmed)  11/10/2024   Pneumonia Vaccine 72 Years old  Completed   Hepatitis C Screening  Completed   Zoster Vaccines- Shingrix  Completed   HPV VACCINES  Aged Out    Health Maintenance  Health Maintenance Due  Topic Date Due   DTaP/Tdap/Td (2 - Tdap) 08/16/2019   INFLUENZA VACCINE  04/19/2022   COVID-19 Vaccine (5 - 2023-24 season) 05/20/2022    Colorectal cancer screening: Type of screening: Colonoscopy. Completed 11/10/21. Repeat every 3 years  Lung Cancer  Screening: (Low Dose CT Chest recommended if Age 72-80years, 30 pack-year currently smoking OR have quit w/in 15years.) does not qualify.   Additional Screening:  Hepatitis C Screening: does qualify; Completed 07/15/16  Vision Screening: Recommended annual ophthalmology exams for early detection of glaucoma and other disorders of the eye. Is the patient up to date with their annual eye exam?  Yes  Who is the provider or what is the name of the office in which the patient attends annual eye exams? Dr.King If pt is not established with a provider, would they like to be referred to a provider to establish care? No .   Dental Screening: Recommended annual dental exams for proper oral hygiene  Community Resource Referral / Chronic Care Management: CRR required this visit?  No   CCM required this visit?  No      Plan:     I have personally reviewed and noted the following in the patient's chart:   Medical and social history Use of alcohol, tobacco or illicit drugs  Current medications and supplements including opioid prescriptions. Patient is not currently taking opioid prescriptions. Functional ability and status Nutritional status Physical activity Advanced directives List of other physicians Hospitalizations, surgeries, and ER visits in previous 12 months Vitals Screenings to include cognitive, depression, and falls Referrals and appointments  In addition, I have reviewed and discussed with patient certain preventive protocols, quality metrics, and best practice recommendations. A written personalized care plan for preventive services as well as general preventive health recommendations were provided to patient.     LDionisio David LPN   [id number]  Nurse Notes: none

## 2022-10-17 ENCOUNTER — Ambulatory Visit (INDEPENDENT_AMBULATORY_CARE_PROVIDER_SITE_OTHER): Payer: PPO | Admitting: Family Medicine

## 2022-10-17 ENCOUNTER — Encounter: Payer: Self-pay | Admitting: Family Medicine

## 2022-10-17 VITALS — BP 130/68 | HR 79 | Temp 98.3°F | Ht 72.0 in | Wt 201.0 lb

## 2022-10-17 DIAGNOSIS — I10 Essential (primary) hypertension: Secondary | ICD-10-CM

## 2022-10-17 DIAGNOSIS — F339 Major depressive disorder, recurrent, unspecified: Secondary | ICD-10-CM

## 2022-10-17 DIAGNOSIS — E782 Mixed hyperlipidemia: Secondary | ICD-10-CM | POA: Diagnosis not present

## 2022-10-17 DIAGNOSIS — E790 Hyperuricemia without signs of inflammatory arthritis and tophaceous disease: Secondary | ICD-10-CM | POA: Diagnosis not present

## 2022-10-17 DIAGNOSIS — Z7189 Other specified counseling: Secondary | ICD-10-CM

## 2022-10-17 DIAGNOSIS — M25559 Pain in unspecified hip: Secondary | ICD-10-CM

## 2022-10-17 DIAGNOSIS — Z0000 Encounter for general adult medical examination without abnormal findings: Secondary | ICD-10-CM

## 2022-10-17 MED ORDER — VENLAFAXINE HCL ER 75 MG PO CP24: 150.0000 mg | ORAL_CAPSULE | Freq: Every day | ORAL | 3 refills | Status: DC

## 2022-10-17 MED ORDER — LISINOPRIL 30 MG PO TABS: 30.0000 mg | ORAL_TABLET | Freq: Every day | ORAL | 3 refills | Status: DC

## 2022-10-17 MED ORDER — TAMSULOSIN HCL 0.4 MG PO CAPS: 0.4000 mg | ORAL_CAPSULE | Freq: Every day | ORAL | 3 refills | Status: DC

## 2022-10-17 MED ORDER — COLCHICINE 0.6 MG PO TABS: 0.6000 mg | ORAL_TABLET | Freq: Every day | ORAL | 1 refills | Status: DC | PRN

## 2022-10-17 MED ORDER — SILDENAFIL CITRATE 20 MG PO TABS: 20.0000 mg | ORAL_TABLET | Freq: Every day | ORAL | 12 refills | Status: DC | PRN

## 2022-10-17 MED ORDER — ROSUVASTATIN CALCIUM 10 MG PO TABS: ORAL_TABLET | ORAL | 3 refills | Status: DC

## 2022-10-17 NOTE — Progress Notes (Unsigned)
Hypertension:    Using medication without problems or lightheadedness: yes Chest pain with exertion:no Edema:no Short of breath:no Labs d/w pt   Elevated Cholesterol: Using medications without problems: yes Muscle aches:  see below but not likely from statin Diet compliance: yes Exercise: yes  Flu 2022 Shingles previously done PNA previously done RSV prev done.   Tetanus 2010, discussed with patient COVID-vaccine previously done. Colonoscopy 2023 PSA screening neg 2024.   Advance directive-wife designated if patient were incapacitated.  GERD.  He hasn't needed PPI, has been off for months. He is monitoring his diet.    Mood d/w pt.  Wife had vision loss from glaucoma.  Daughter has cancer.  D/w pt. Still on effexor at baseline.    Rare gout sx.  Labs d/w pt.  He is paying attention to diet.    Discomfort episodically near the R hip, R inguinal area, R lateral portion of the pelvis.  Never on the L side.  Going on episodically for years. Prev runner.   Prev radiation to the L foot but not in the last year.   PMH and SH reviewed  Meds, vitals, and allergies reviewed.   ROS: Per HPI unless specifically indicated in ROS section   GEN: nad, alert and oriented HEENT: mucous membranes moist NECK: supple w/o LA CV: rrr. PULM: ctab, no inc wob ABD: soft, +bs EXT: no edema SKIN: no acute rash

## 2022-10-17 NOTE — Patient Instructions (Addendum)
Please ask the front about getting a copy of the colonoscopy report from last year.    Take care.  Glad to see you.  Try ice for 5 minutes at a time instead of heat and let me know if that isn't helping.  We can set up PT if needed.  Likely recurrent/intermittent muscle strain.

## 2022-10-19 DIAGNOSIS — Z0000 Encounter for general adult medical examination without abnormal findings: Secondary | ICD-10-CM | POA: Insufficient documentation

## 2022-10-19 NOTE — Assessment & Plan Note (Signed)
Flu 2022 Shingles previously done PNA previously done RSV prev done.   Tetanus 2010, discussed with patient COVID-vaccine previously done. Colonoscopy 2023 PSA screening neg 2024.   Advance directive-wife designated if patient were incapacitated.

## 2022-10-19 NOTE — Assessment & Plan Note (Signed)
Advance directive- wife designated if patient were incapacitated.  

## 2022-10-19 NOTE — Assessment & Plan Note (Signed)
Labs discussed with patient.  Continue lisinopril

## 2022-10-19 NOTE — Assessment & Plan Note (Signed)
Discussed anatomy.  Would stop using heating pad and try icing for 5 minutes at a time instead.  He can let me know if that isn't helping.  We can set up PT if needed.  Likely recurrent/intermittent muscle strain.  I do not suspect intrinsic hip pathology to be the main issue.

## 2022-10-19 NOTE — Assessment & Plan Note (Signed)
Labs discussed with patient.  Continue Crestor

## 2022-10-19 NOTE — Assessment & Plan Note (Signed)
Stressors discussed with patient, he is managing as is, would continue venlafaxine as is.

## 2022-10-19 NOTE — Assessment & Plan Note (Signed)
Rare gout symptoms.  He is managing with his diet to limit events.  He will update me as needed.

## 2022-10-25 ENCOUNTER — Ambulatory Visit: Payer: PPO | Admitting: Dermatology

## 2022-10-25 VITALS — BP 157/92 | HR 94

## 2022-10-25 DIAGNOSIS — D229 Melanocytic nevi, unspecified: Secondary | ICD-10-CM | POA: Diagnosis not present

## 2022-10-25 DIAGNOSIS — L578 Other skin changes due to chronic exposure to nonionizing radiation: Secondary | ICD-10-CM

## 2022-10-25 DIAGNOSIS — L820 Inflamed seborrheic keratosis: Secondary | ICD-10-CM

## 2022-10-25 DIAGNOSIS — Z85828 Personal history of other malignant neoplasm of skin: Secondary | ICD-10-CM | POA: Diagnosis not present

## 2022-10-25 DIAGNOSIS — L814 Other melanin hyperpigmentation: Secondary | ICD-10-CM | POA: Diagnosis not present

## 2022-10-25 DIAGNOSIS — Z86018 Personal history of other benign neoplasm: Secondary | ICD-10-CM

## 2022-10-25 DIAGNOSIS — L409 Psoriasis, unspecified: Secondary | ICD-10-CM

## 2022-10-25 DIAGNOSIS — L570 Actinic keratosis: Secondary | ICD-10-CM | POA: Diagnosis not present

## 2022-10-25 DIAGNOSIS — Z1283 Encounter for screening for malignant neoplasm of skin: Secondary | ICD-10-CM

## 2022-10-25 DIAGNOSIS — L219 Seborrheic dermatitis, unspecified: Secondary | ICD-10-CM | POA: Diagnosis not present

## 2022-10-25 DIAGNOSIS — Z79899 Other long term (current) drug therapy: Secondary | ICD-10-CM

## 2022-10-25 DIAGNOSIS — L853 Xerosis cutis: Secondary | ICD-10-CM | POA: Diagnosis not present

## 2022-10-25 MED ORDER — ZORYVE 0.3 % EX CREA: 1.0000 | TOPICAL_CREAM | CUTANEOUS | 5 refills | Status: AC

## 2022-10-25 MED ORDER — CLOBETASOL PROPIONATE 0.05 % EX SOLN: 1.0000 | CUTANEOUS | 5 refills | Status: DC

## 2022-10-25 MED ORDER — KETOCONAZOLE 2 % EX SHAM: 1.0000 | MEDICATED_SHAMPOO | CUTANEOUS | 6 refills | Status: DC

## 2022-10-25 NOTE — Progress Notes (Unsigned)
Follow-Up Visit   Subjective  Ryan Blevins is a 72 y.o. male who presents for the following: Upper body skin exam (Hx of BCCs, hx of SCC, hx of Dysplastic Nevi, hx of Aks), Seb derm/psoriasis/sebospsoriasis (Scalp, Pt has been given Clobetasol shampooo, Ketoconazole 2% shampoo, T sal shampoo, Clobetasol foam, zoryve cr, pt has used each at different times, clobetasol foam does not last him), and check spot (R calf, 55m irritating). The patient presents for Upper Body Skin Exam (UBSE) for skin cancer screening and mole check.  The patient has spots, moles and lesions to be evaluated, some may be new or changing and the patient has concerns that these could be cancer.   The following portions of the chart were reviewed this encounter and updated as appropriate:   Tobacco  Allergies  Meds  Problems  Med Hx  Surg Hx  Fam Hx     Review of Systems:  No other skin or systemic complaints except as noted in HPI or Assessment and Plan.  Objective  Well appearing patient in no apparent distress; mood and affect are within normal limits.  All skin waist up examined.  Scalp Pinkness in scalp  hands, face x 16 (16) Pink scaly macules  R medial mid calf x 1 Stuck on waxy paps with erythema   Assessment & Plan   Lentigines - Scattered tan macules - Due to sun exposure - Benign-appearing, observe - Recommend daily broad spectrum sunscreen SPF 30+ to sun-exposed areas, reapply every 2 hours as needed. - Call for any changes  Seborrheic Keratoses - Stuck-on, waxy, tan-brown papules and/or plaques  - Benign-appearing - Discussed benign etiology and prognosis. - Observe - Call for any changes  Melanocytic Nevi - Tan-brown and/or pink-flesh-colored symmetric macules and papules - Benign appearing on exam today - Observation - Call clinic for new or changing moles - Recommend daily use of broad spectrum spf 30+ sunscreen to sun-exposed areas.   Hemangiomas - Red papules -  Discussed benign nature - Observe - Call for any changes  Actinic Damage - Chronic condition, secondary to cumulative UV/sun exposure - diffuse scaly erythematous macules with underlying dyspigmentation - Recommend daily broad spectrum sunscreen SPF 30+ to sun-exposed areas, reapply every 2 hours as needed.  - Staying in the shade or wearing long sleeves, sun glasses (UVA+UVB protection) and wide brim hats (4-inch brim around the entire circumference of the hat) are also recommended for sun protection.  - Call for new or changing lesions.  Skin cancer screening performed today.   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 Condition is symptomatic / bothersome to patient. Not to goal.  Seborrheic Dermatitis  -  is a chronic persistent rash characterized by pinkness and scaling most commonly of the mid face but also can occur on the scalp (dandruff), ears; mid chest, mid back and groin.  It tends to be exacerbated by stress and cooler weather.  People who have neurologic disease may experience new onset or exacerbation of existing seborrheic dermatitis.  The condition is not curable but treatable and can be controlled.  Counseling on psoriasis and coordination of care  psoriasis is a chronic non-curable, but treatable genetic/hereditary disease that may have other systemic features affecting other organ systems such as joints (Psoriatic Arthritis). It is associated with an increased risk of inflammatory bowel disease, heart disease, non-alcoholic fatty liver disease, and depression.  Treatments include light and laser treatments;  topical medications; and systemic medications including oral and injectables.   D/C Clobetasol foam D/C Clobetasol shampoo Start Clobetasol solution on Monday, Wednesday, Fridays at bed time leave on overnight and wash out in the morning, avoid  f/g/a Cont Ketoconazole 2% shampoo 3 days a week, let sit 5 minutes and rinse out Cont T-Sal shampoo 3-4 days a week, alternating with Ketoconazole 2% shampoo Start Zoryve cream 3d/wk, Tuesday, Thursday and Saturday at bedtime, samples x 2 Lot MBBD exp 03/25  Topical steroids (such as triamcinolone, fluocinolone, fluocinonide, mometasone, clobetasol, halobetasol, betamethasone, hydrocortisone) can cause thinning and lightening of the skin if they are used for too long in the same area. Your physician has selected the right strength medicine for your problem and area affected on the body. Please use your medication only as directed by your physician to prevent side effects.    Roflumilast (ZORYVE) 0.3 % CREA - Scalp Apply 1 Application topically as directed. Apply to aa scalp 3 days a week at bedtime Tuesday, Thursday and Saturday ketoconazole (NIZORAL) 2 % shampoo - Scalp Apply 1 Application topically 3 (three) times a week. Wash scalp 3 times weekly, let sit for 5 minutes and rinse out Related Medications clobetasol (TEMOVATE) 0.05 % external solution Apply 1 Application topically as directed. Apply to aa scalp 3 nights a week Monday, Wednesday, Friday, avoid face, groin, axilla  AK (actinic keratosis) (16) hands, face x 16 Destruction of lesion - hands, face x 16 Complexity: simple   Destruction method: cryotherapy   Informed consent: discussed and consent obtained   Timeout:  patient name, date of birth, surgical site, and procedure verified Lesion destroyed using liquid nitrogen: Yes   Region frozen until ice ball extended beyond lesion: Yes   Outcome: patient tolerated procedure well with no complications   Post-procedure details: wound care instructions given    Inflamed seborrheic keratosis R medial mid calf x 1 Symptomatic, irritating, patient would like treated. Destruction of lesion - R medial mid calf x 1 Complexity: simple   Destruction method: cryotherapy   Informed  consent: discussed and consent obtained   Timeout:  patient name, date of birth, surgical site, and procedure verified Lesion destroyed using liquid nitrogen: Yes   Region frozen until ice ball extended beyond lesion: Yes   Outcome: patient tolerated procedure well with no complications   Post-procedure details: wound care instructions given    History of Basal Cell Carcinoma of the Skin - No evidence of recurrence today - Recommend regular full body skin exams - Recommend daily broad spectrum sunscreen SPF 30+ to sun-exposed areas, reapply every 2 hours as needed.  - Call if any new or changing lesions are noted between office visits  - L med pretibial, R sup helix, L med upper eyelid  History of Squamous Cell Carcinoma of the Skin - No evidence of recurrence today - No lymphadenopathy - Recommend regular full body skin exams - Recommend daily broad spectrum sunscreen SPF 30+ to sun-exposed areas, reapply every 2 hours as needed.  - Call if any new or changing lesions are noted between office visits - R ear at the sup antihelix, R hand dorsum  History of Dysplastic Nevi - No evidence of recurrence today - Recommend regular full body skin exams - Recommend daily broad spectrum sunscreen SPF 30+ to sun-exposed areas, reapply every 2 hours as needed.  - Call if any new or changing lesions are noted between office visits  - L med medial calf  Xerosis - diffuse  xerotic patches - recommend gentle, hydrating skin care - gentle skin care handout given  - Recommend Cerave cream qd  Return in about 6 months (around 04/25/2023) for AK f/u, Seb Derm/psoriasis/Sebopsoriasis f/u.  I, Othelia Pulling, RMA, am acting as scribe for Sarina Ser, MD . Documentation: I have reviewed the above documentation for accuracy and completeness, and I agree with the above.  Sarina Ser, MD

## 2022-10-25 NOTE — Patient Instructions (Addendum)
For Scalp Start Clobetasol solution on Monday, Wednesday, Fridays at bed time leave on overnight and wash out in the morning Continue Ketoconazole 2% shampoo 3 days a week, let sit 5 minutes and rinse out Continue T-Sal shampoo 3-4 days a week, alternating with Ketoconazole 2% shampoo Start Zoryve cream 3 days a week, Tuesday, Thursday and Saturday at bedtime  Cerave cream for moisturizer daily after shower   Your prescription was sent to [facility] [place] in [place]. A representative from [place] will contact you within 3 business hours to verify your address and insurance information to schedule a free delivery. If for any reason you do not receive a phone call from them, please reach out to them. Their phone number is [phone] and their hours are Monday-Friday 9:00 am-5:00 pm.    Cryotherapy Aftercare  Wash gently with soap and water everyday.   Apply Vaseline and Band-Aid daily until healed.   Due to recent changes in healthcare laws, you may see results of your pathology and/or laboratory studies on MyChart before the doctors have had a chance to review them. We understand that in some cases there may be results that are confusing or concerning to you. Please understand that not all results are received at the same time and often the doctors may need to interpret multiple results in order to provide you with the best plan of care or course of treatment. Therefore, we ask that you please give [place] 2 business days to thoroughly review all your results before contacting the office for clarification. Should we see a critical lab result, you will be contacted sooner.   If You Need Anything After Your Visit  If you have any questions or concerns for your doctor, please call our main line at [phone] and press option 4 to reach your doctor's medical assistant. If no one answers, please leave a voicemail as directed and we will return your call as soon as possible. Messages left  after 4 pm will be answered the following business day.   You may also send [place] a message via [place]. We typically respond to MyChart messages within 1-2 business days.  For prescription refills, please ask your pharmacy to contact our office. Our fax number is [phone].  If you have an urgent issue when the clinic is closed that cannot wait until the next business day, you can page your doctor at the number below.    Please note that while we do our best to be available for urgent issues outside of office hours, we are not available 24/7.   If you have an urgent issue and are unable to reach [place], you may choose to seek medical care at your doctor's office, retail clinic, urgent care center, or emergency room.  If you have a medical emergency, please immediately call 911 or go to the emergency department.  Pager Numbers  - Dr. Nehemiah Massed: [phone]  - Dr. Laurence Ferrari: [phone]  - Dr. Nicole Kindred: [phone]  In the event of inclement weather, please call our main line at [phone] for an update on the status of any delays or closures.  Dermatology Medication Tips: Please keep the boxes that topical medications come in in order to help keep track of the instructions about where and how to use these. Pharmacies typically print the medication instructions only on the boxes and not directly on the medication tubes.   If your medication is too expensive, please contact our office at [phone] option 4 or send [place] a message  through [place].   We are unable to tell what your co-pay for medications will be in advance as this is different depending on your insurance coverage. However, we may be able to find a substitute medication at lower cost or fill out paperwork to get insurance to cover a needed medication.   If a prior authorization is required to get your medication covered by your insurance company, please allow [place] 1-2 business days to complete this process.  Drug prices often  vary depending on where the prescription is filled and some pharmacies may offer cheaper prices.  The website www.goodrx.com contains coupons for medications through different pharmacies. The prices here do not account for what the cost may be with help from insurance (it may be cheaper with your insurance), but the website can give you the price if you did not use any insurance.  - You can print the associated coupon and take it with your prescription to the pharmacy.  - You may also stop by our office during regular business hours and pick up a GoodRx coupon card.  - If you need your prescription sent electronically to a different pharmacy, notify our office through [facility] or by phone at [phone] option 4.     Si Usted Necesita Algo Despus de Su Visita  Tambin puede enviarnos un mensaje a travs de Pharmacist, community. Por lo general respondemos a los mensajes de MyChart en el transcurso de 1 a 2 das hbiles.  Para renovar recetas, por favor pida a su farmacia que se ponga en contacto con nuestra oficina. Harland Dingwall de fax es [place] [phone].  Si tiene un asunto urgente cuando la clnica est cerrada y que no puede esperar hasta el siguiente da hbil, puede llamar/localizar a su doctor(a) al nmero que aparece a continuacin.   Por favor, tenga en cuenta que aunque hacemos todo lo posible para estar disponibles para asuntos urgentes fuera del horario de [place], no estamos disponibles las 24 horas del da, los 7 das de la [place].   Si tiene un problema urgente y no puede comunicarse con nosotros, puede optar por buscar atencin mdica  en el consultorio de su doctor(a), en una clnica privada, en un centro de atencin urgente o en una sala de emergencias.  Si tiene Engineering geologist, por favor llame inmediatamente al 911 o vaya a la sala de emergencias.  Nmeros de bper  - Dr. Nehemiah Massed: [phone]  - Dra. Moye: [phone]  - Dra. Nicole Kindred: [phone]  En caso  de inclemencias del [place], por favor llame a Johnsie Kindred principal al [phone] para una actualizacin sobre el [place] de cualquier retraso o cierre.  Consejos para la medicacin en dermatologa: Por favor, guarde las cajas en las que vienen los medicamentos de uso tpico para ayudarle a seguir las instrucciones sobre dnde y cmo usarlos. Las farmacias generalmente imprimen las instrucciones del medicamento slo en las cajas y no directamente en los tubos del [place].   Si su medicamento es muy caro, por favor, pngase en contacto con Zigmund Daniel llamando al [phone] y presione la opcin 4 o envenos un mensaje a travs de Pharmacist, community.   No podemos decirle cul ser su copago por los medicamentos por adelantado ya que esto es diferente dependiendo de la cobertura de su seguro. Sin embargo, es posible que podamos encontrar un medicamento sustituto a Electrical engineer un formulario para que el seguro cubra el medicamento que se considera necesario.   Si se requiere Solicitor  previa para que su compaa de seguros [place] su medicamento, por favor permtanos de 1 a 2 das hbiles para completar este proceso.  Los precios de los medicamentos varan con frecuencia dependiendo del Environmental consultant de dnde se surte la receta y alguna farmacias pueden ofrecer precios ms baratos.  El sitio web www.goodrx.com tiene cupones para medicamentos de Airline pilot. Los precios aqu no tienen en cuenta lo que podra costar con la ayuda del seguro (puede ser ms barato con su seguro), pero el sitio web puede darle el precio si no utiliz Research scientist (physical sciences).  - Puede imprimir el cupn correspondiente y llevarlo con su receta a la farmacia.  - Tambin puede pasar por nuestra oficina durante el horario de atencin regular y Charity fundraiser una tarjeta de cupones de GoodRx.  - Si necesita que su receta se enve electrnicamente a una farmacia diferente, informe a nuestra oficina a travs de MyChart de  o  por telfono llamando al [phone] y presione la opcin 4.

## 2022-10-26 ENCOUNTER — Encounter: Payer: Self-pay | Admitting: Dermatology

## 2022-10-27 ENCOUNTER — Ambulatory Visit: Payer: Commercial Managed Care - HMO

## 2022-10-27 ENCOUNTER — Telehealth: Payer: Self-pay

## 2022-10-27 DIAGNOSIS — M25562 Pain in left knee: Secondary | ICD-10-CM

## 2022-10-27 MED ORDER — CELECOXIB 200 MG PO CAPS
200 mg | ORAL_CAPSULE | Freq: Two times a day (BID) | ORAL | 2 refills | Status: AC
Start: 2022-10-27 — End: ?

## 2022-10-27 NOTE — Progress Notes
Date: 10/27/2022  Name: Daryl Graves   MRN#: [id number]   DOB: 07-31-1951   Age: 72 y.o.      Dr. Leane Call, DO      The patient was seen at the [facility] office.    CHIEF COMPLAINT: Status-post open left knee ganglion cyst removal    INTERIM HISTORY: Daryl Graves presents 1 year post-operatively from a open left knee ganglion cyst removal. Patient underwent surgical intervention with Dr. Monica Martinez, including a muscle flap implantation of the left knee. He was placed in a knee immobilizer for approximately 1 week wherein which states he started to develop stiffness and loss of range of motion.  The patient's pain level today is moderate in severity and is dull in quality. The patient denies fevers, chills or systemic complaints. He denies any feelings of mechanical locking or catching. He states that the size of swelling and pain feels similar as it did prior to the surgery. Patient is requesting physical therapy to improve his range of motion.     PHYSICAL EXAM:  Constitutional: ASAAD GULLEY is a 72 y.o. male in no acute distress.   Psyc: he is alert and oriented x3 with a normal mood and affect.   HENT: AT/NC; hearing intact  Eyes: PERRLA. Extra-ocular movements are intact.  Skin: Normal temperature. There are no rashes, ulcerations, or lesions.  Lymphatic: No induration or enlargement.  Extremities: No swelling or calf tenderness. No clubbing. No cyanosis.  Gait:  []  Antalgic [x]  Normal []  Trendelenburg   Assistive devices: none    Left knee: Curved lateral incision with mild swelling no signs or symptoms of infection. There is no drainage, bruising, or ecchymosis. No signs or symptoms of infection. There is no knee joint swelling.  A no knee joint effusion is present. Palpation of the knee reveals no tenderness. There is no calf tenderness with palpation. Negative Homan's sign.    Range of motion is 3? extension to 75? flexion with no pain. Knee is stable with +2 laxity with varus/valgus stress at 0,30, and 90 degree. Normal patellar tracking. Muscle Strength of the knee is 5/5 with respect to flexors and extensors.     Special tests:   Anterior drawer test:  []  Positive [x]  Negative []  Not Tested  Posterior drawer test:  []  Positive [x]  Negative []  Not Tested  Patellar apprehension test: []  Positive [x]  Negative []  Not Tested  Patellar grind test:  []  Positive [x]  Negative []  Not Tested      RADIOGRAPHS: Three views of the left knee and 3 view(s) comparison of the opposite knee were ordered, obtained and reviewed by me here at [facility] today and reviewed with the patient. Radiographs demonstrate a TKA in good position and alignment.  There is optimal implant cement-bone interface without radiolucencies. There is no evidence of loosening or other abnormalities. The patella is well located.       MRI: MRI of the left knee was performed at [facility] on December 13, 2021. Study shows:  There is no interval change from the prior most recent study of July 2022.  There is a total knee arthroplasty in place with no subluxation fracture or loosening. There is still focal areas of interosseous cystic change along the posterior lateral and medial femoral condyle.  Complete stability to the greater than 5 cm cystic mass deep within the subcutaneous areas along the lateral joint.     IMPRESSION:  1.  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  Status-post open left knee ganglion cyst removal  3.  Status post left knee surgery with muscle flap implantation with Dr. Monica Martinez  4.  Impending arthrofibrosis    PLAN: We had a lengthy discussion with the patient today regarding his condition.Their knee flexion range of motion is lacking approximately 30? behind the average individual following his surgical intervention with Dr. Monica Martinez.  They are at risk of impending arthrofibrosis.  They understand that it is exquisitely important for them to do daily exercises. We will confer with Dr. Monica Martinez if he can proceed with formal physical therapy.The patient was given a PT script on today's visit.  Overall he feels that his discomfort and swelling is similar as to what it was prior to surgery. He may continue with Voltaren gel around his incision to help with inflammation.     He understands he can not take multiple oral NSAIDs simultaneously as this can cause irreparable kidney damage and ulcers.     FOLLOW-UP: Return to clinic in 6 weeks.     Thank you for allowing me to participate in Daryl Graves?s care. Please do not hesitate to contact me if you have any questions or concerns.           Leane Call, D.O.  Adult Reconstruction Specialist  ORTHOPEDIC SURGERY  10/27/2022    Susa Simmonds, MD

## 2022-10-27 NOTE — Telephone Encounter (Signed)
Ryan Blevins is non formulary and [facility] unable to provide cash pay/non covered option due to age and [employer].

## 2022-12-08 NOTE — Progress Notes
Date: 12/08/2022  Name: Daryl Graves   MRN#: [id number]   DOB: Jul 15, 1951   Age: 72 y.o.      Dr. Leane Call, DO      The patient was seen at the [facility] office.    CHIEF COMPLAINT: Status-post open left knee ganglion cyst removal    INTERIM HISTORY: Daryl Graves presents 1 year post-operatively from a open left knee ganglion cyst removal. Patient underwent surgical intervention with Dr. Monica Martinez, including a muscle flap implantation of the left knee. He was placed in a knee immobilizer for approximately 1 week wherein which states he started to develop stiffness and loss of range of motion.  The patient's pain level today is moderate in severity and is dull in quality. The patient denies fevers, chills or systemic complaints. He denies any feelings of mechanical locking or catching. He states that the size of swelling and pain feels similar as it did prior to the surgery. Patient is requesting physical therapy to improve his range of motion.     PHYSICAL EXAM:  Constitutional: Daryl Graves is a 72 y.o. male in no acute distress.   Psyc: he is alert and oriented x3 with a normal mood and affect.   HENT: AT/NC; hearing intact  Eyes: PERRLA. Extra-ocular movements are intact.  Skin: Normal temperature. There are no rashes, ulcerations, or lesions.  Lymphatic: No induration or enlargement.  Extremities: No swelling or calf tenderness. No clubbing. No cyanosis.  Gait:  []  Antalgic [x]  Normal []  Trendelenburg   Assistive devices: none    Left knee: Curved lateral incision with mild swelling no signs or symptoms of infection. There is no drainage, bruising, or ecchymosis. No signs or symptoms of infection. There is no knee joint swelling.  A no knee joint effusion is present. Palpation of the knee reveals no tenderness. There is no calf tenderness with palpation. Negative Homan's sign.    Range of motion is 3? extension to 75? flexion with no pain. Knee is stable with +2 laxity with varus/valgus stress at 0,30, and 90 degree. Normal patellar tracking. Muscle Strength of the knee is 5/5 with respect to flexors and extensors.     Special tests:   Anterior drawer test:  []  Positive [x]  Negative []  Not Tested  Posterior drawer test:  []  Positive [x]  Negative []  Not Tested  Patellar apprehension test: []  Positive [x]  Negative []  Not Tested  Patellar grind test:  []  Positive [x]  Negative []  Not Tested      RADIOGRAPHS: Three views of the left knee and 3 view(s) comparison of the opposite knee were ordered, obtained and reviewed by me here at [facility] today and reviewed with the patient. Radiographs demonstrate a TKA in good position and alignment.  There is optimal implant cement-bone interface without radiolucencies. There is no evidence of loosening or other abnormalities. The patella is well located.       MRI: MRI of the left knee was performed at [facility] on December 13, 2021. Study shows:  There is no interval change from the prior most recent study of July 2022.  There is a total knee arthroplasty in place with no subluxation fracture or loosening. There is still focal areas of interosseous cystic change along the posterior lateral and medial femoral condyle.  Complete stability to the greater than 5 cm cystic mass deep within the subcutaneous areas along the lateral joint.     IMPRESSION:  1.  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  Status-post open left knee ganglion cyst removal  3.  Status post left knee surgery with muscle flap implantation with Dr. Monica Martinez  4.  Impending arthrofibrosis    PLAN: We had a lengthy discussion with the patient today regarding his condition.Their knee flexion range of motion is lacking approximately 30? behind the average individual following his surgical intervention with Dr. Monica Martinez.  They are at risk of impending arthrofibrosis.  They understand that it is exquisitely important for them to do daily exercises. We will confer with Dr. Monica Martinez if he can proceed with formal physical therapy.The patient was given a PT script on today's visit.  Overall he feels that his discomfort and swelling is similar as to what it was prior to surgery. He may continue with Voltaren gel around his incision to help with inflammation.     He understands he can not take multiple oral NSAIDs simultaneously as this can cause irreparable kidney damage and ulcers.     FOLLOW-UP: Return to clinic in 6 weeks.     Thank you for allowing me to participate in Daryl Graves?s care. Please do not hesitate to contact me if you have any questions or concerns.           Leane Call, D.O.  Adult Reconstruction Specialist  ORTHOPEDIC SURGERY  12/08/2022    Susa Simmonds, MD

## 2022-12-13 ENCOUNTER — Ambulatory Visit: Payer: PRIVATE HEALTH INSURANCE

## 2022-12-19 NOTE — Progress Notes
Date:12/23/2022  Name: Daryl Graves   MRN#: [id number]   DOB: 11/23/1950   Age: 72 y.o.      Dr. Leane Call, DO      The patient was seen at the [facility] office.    CHIEF COMPLAINT: Status-post open left knee ganglion cyst removal    INTERIM HISTORY: Daryl Graves presents 1 year post-operatively from a open left knee ganglion cyst removal. 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 Patient underwent surgical intervention with Dr. Monica Martinez, including a muscle flap implantation of the left knee.  Since his surgery, he continues to have neuritic pain, noting an ''electric shock'', when moving his left knee with certain movements. He feels limitation with bending and extending his knee.  The patient denies fevers, chills or systemic complaints. He denies any feelings of mechanical locking or catching. He has continued with physical therapy, but is struggles with his flexion and extension.       PHYSICAL EXAM:  Constitutional: Daryl Graves is a 72 y.o. male in no acute distress.   Psyc: he is alert and oriented x3 with a normal mood and affect.   HENT: AT/NC; hearing intact  Eyes: PERRLA. Extra-ocular movements are intact.  Skin: Normal temperature. There are no rashes, ulcerations, or lesions.  Lymphatic: No induration or enlargement.  Extremities: No swelling or calf tenderness. No clubbing. No cyanosis.  Gait:  []  Antalgic [x]  Normal []  Trendelenburg   Assistive devices: none    Left knee: Curved lateral incision with mild swelling no signs or symptoms of infection. There is no drainage, bruising, or ecchymosis. No signs or symptoms of infection. There is no knee joint swelling.  A no knee joint effusion is present. Palpation of the knee reveals no tenderness. There is no calf tenderness with palpation. Negative Homan's sign.    Range of motion is 3? extension to 70? flexion with no pain. Knee is stable with +2 laxity with varus/valgus stress at 0,30, and 90 degree. Normal patellar tracking. Muscle Strength of the knee is 5/5 with respect to flexors and extensors.     Special tests:   Anterior drawer test:  []  Positive [x]  Negative []  Not Tested  Posterior drawer test:  []  Positive [x]  Negative []  Not Tested  Patellar apprehension test: []  Positive [x]  Negative []  Not Tested  Patellar grind test:  []  Positive [x]  Negative []  Not Tested      RADIOGRAPHS: Not obtained today. Previous radiographs were reviewed in office.       MRI: MRI of the left knee was performed at [facility] on December 13, 2021. Study shows:  There is no interval change from the prior most recent study of July 2022.  There is a total knee arthroplasty in place with no subluxation fracture or loosening. There is still focal areas of interosseous cystic change along the posterior lateral and medial femoral condyle.  Complete stability to the greater than 5 cm cystic mass deep within the subcutaneous areas along the lateral joint.     IMPRESSION:  1.  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  Status-post open left knee ganglion cyst removal DOS 12/24/2020   3.  Status post left knee surgery with muscle flap implantation with Dr. Monica Martinez  4.  Impending arthrofibrosis    PLAN: We had a lengthy discussion with the patient today  regarding his condition.Their knee flexion range of motion is lacking approximately 30? behind the average individual following his surgical intervention with Dr. Monica Martinez.  They are at risk of impending arthrofibrosis.  They understand that it is exquisitely important for them to do daily exercises. In terms of his neuritic complaints, we discussed neuromodulator medications,  such as Gabapentin versus Lyrica and genicular nerve block versus radiofrequency ablation of the left knee. He is to continue with Lyrica and his physical therapy sessions. If he finds no improvement with either, we may consider a genicular nerve block.     He understands he can not take multiple oral NSAIDs simultaneously as this can cause irreparable kidney damage and ulcers.     FOLLOW-UP: Return via telemedicine encounter in 3 weeks.     Thank you for allowing me to participate in Daryl Graves?s care. Please do not hesitate to contact me if you have any questions or concerns.           Leane Call, D.O.  Adult Reconstruction Specialist  ORTHOPEDIC SURGERY  12/19/2022    Susa Simmonds, MD

## 2022-12-23 ENCOUNTER — Ambulatory Visit: Payer: Commercial Managed Care - HMO

## 2022-12-23 MED ORDER — PREGABALIN 75 MG PO CAPS
ORAL_CAPSULE | 0 refills | Status: AC
Start: 2022-12-23 — End: ?

## 2023-01-18 ENCOUNTER — Telehealth: Payer: Commercial Managed Care - HMO

## 2023-01-18 NOTE — Progress Notes
Date:12/23/2022  Name: Daryl Graves   MRN#: [id number]   DOB: 06/25/1951   Age: 72 y.o.      Dr. Leane Call, DO    Telephone follow-up    CHIEF COMPLAINT: Status-post open left knee ganglion cyst removal    INTERIM HISTORY: Daryl Graves presents 1 year post-operatively from a open left knee ganglion cyst removal.  He states that since last seeing [place] that the Lyrica had no effect on his knee pain or discomfort whatsoever. Since his surgery, he continues to have neuritic pain, noting an ''electric shock'', when moving his left knee with certain movements. He feels limitation with bending and extending his knee.  The patient denies fevers, chills or systemic complaints. He denies any feelings of mechanical locking or catching. He has continued with physical therapy, but is struggles with his flexion and extension.        IMPRESSION:  1.  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  Status-post open left knee ganglion cyst removal DOS 12/24/2020   3.  Status post left knee surgery with muscle flap implantation with Dr. Monica Martinez  4.  Impending arthrofibrosis    PLAN: We had a lengthy discussion with the patient today regarding his condition.Their knee flexion range of motion is lacking approximately 30? behind the average individual following his surgical intervention with Dr. Monica Martinez.  They are at risk of impending arthrofibrosis.  They understand that it is exquisitely important for them to do daily exercises. In terms of his neuritic complaints, we discussed  genicular nerve block versus radiofrequency ablation of the left knee.  He is exhausted oral anti-inflammatories, gabapentin, Lyrica, therapy.  At this point, I would like to request insurance authorization for genicular nerve block versus radiofrequency ablation with either Dr. Nelda Marseille or Dr. [place].    FOLLOW-UP:  3-4 weeks after genicular nerve block    Thank you for allowing me to participate in Daryl Graves?s care. Please do not hesitate to contact me if you have any questions or concerns.           Leane Call, D.O.  Adult Reconstruction Specialist  ORTHOPEDIC SURGERY  01/18/2023    Susa Simmonds, MD

## 2023-02-08 ENCOUNTER — Ambulatory Visit: Payer: PPO | Admitting: Dermatology

## 2023-02-08 DIAGNOSIS — L409 Psoriasis, unspecified: Secondary | ICD-10-CM | POA: Diagnosis not present

## 2023-02-08 DIAGNOSIS — L821 Other seborrheic keratosis: Secondary | ICD-10-CM

## 2023-02-08 MED ORDER — HALOBETASOL PROPIONATE 0.05 % EX FOAM: CUTANEOUS | 3 refills | Status: DC

## 2023-02-08 NOTE — Progress Notes (Signed)
   Follow-Up Visit   Subjective  Ryan Blevins is a 72 y.o. male who presents for the following: Spot on the R ear, improved since scheduling appointment. 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   The patient has spots, moles and lesions to be evaluated, some may be new or changing and the patient has concerns that these could be cancer.  The following portions of the chart were reviewed this encounter and updated as appropriate: medications, allergies, medical history  Review of Systems:  No other skin or systemic complaints except as noted in HPI or Assessment and Plan.  Objective  Well appearing patient in no apparent distress; mood and affect are within normal limits.  A focused examination was performed of the following areas: The face, ears, and scalp  Relevant physical exam findings are noted in the Assessment and Plan.    Assessment & Plan   PSORIASIS/sebopsoriasis of the scalp -   Exam: Well-demarcated erythematous papules/plaques with silvery scale, guttate pink scaly papules.   Chronic and persistent condition with duration or expected duration over one year. Condition is bothersome/symptomatic for patient. Currently flared.  Patient denies joint pain  Psoriasis is a chronic non-curable, but treatable genetic/hereditary disease that may have other systemic features affecting other organ systems such as joints (Psoriatic Arthritis). It is associated with an increased risk of inflammatory bowel disease, heart disease, non-alcoholic fatty liver disease, and depression.  Treatments include light and laser treatments; topical medications; and systemic medications including oral and injectables.  Treatment Plan:  Samples given of Zoryve foam to aa's QD PRN. Samples given of Vtama apply to aa's QD PRN. Start Halobetasol foam to aa's scalp QD. Recommend Prosoria QD PRN.   Continue Ketoconazole 2%  shampoo 3d/wk.   Patient defers oral or biologic treatment options at this time.   SEBORRHEIC KERATOSIS - Stuck-on, waxy, tan-brown papules and/or plaques  - Benign-appearing - Discussed benign etiology and prognosis. - Observe - Call for any changes - Apply diclofenac (voltaren) gel twice a day to spots.   Return for appointment as scheduled.  Maylene Roes, CMA, am acting as scribe for Darden Dates, MD .  Documentation: I have reviewed the above documentation for accuracy and completeness, and I agree with the above.  Darden Dates, MD

## 2023-02-08 NOTE — Patient Instructions (Addendum)
Your prescription was sent to [facility] in [place]. A representative from [employer] will contact you within 2 business hours to verify your address and insurance information to schedule a free delivery. If for any reason you do not receive a phone call from them, please reach out to them. Their phone number is [phone] and their hours are Monday-Friday 9:00 am-5:00 pm.     Recommend OTC Voltaren/Diclofenac gel twice daily to seborrheic keratosis spots on the neck.   Due to recent changes in healthcare laws, you may see results of your pathology and/or laboratory studies on MyChart before the doctors have had a chance to review them. We understand that in some cases there may be results that are confusing or concerning to you. Please understand that not all results are received at the same time and often the doctors may need to interpret multiple results in order to provide you with the best plan of care or course of treatment. Therefore, we ask that you please give [place] 2 business days to thoroughly review all your results before contacting the office for clarification. Should we see a critical lab result, you will be contacted sooner.   If You Need Anything After Your Visit  If you have any questions or concerns for your doctor, please call our main line at [phone] and press option 4 to reach your doctor's medical assistant. If no one answers, please leave a voicemail as directed and we will return your call as soon as possible. Messages left after 4 pm will be answered the following business day.   You may also send [place] a message via MyChart. We typically respond to MyChart messages within 1-2 business days.  For prescription refills, please ask your pharmacy to contact our office. Our fax number is [phone].  If you have an urgent issue when the clinic is closed that cannot wait until the next business day, you can page your doctor at the number below.    Please note  that while we do our best to be available for urgent issues outside of office hours, we are not available 24/7.   If you have an urgent issue and are unable to reach [place], you may choose to seek medical care at your doctor's office, retail clinic, urgent care center, or emergency room.  If you have a medical emergency, please immediately call 911 or go to the emergency department.  Pager Numbers  - Dr. Gwen Pounds: [phone]  - Dr. Neale Burly: [phone]  - Dr. Roseanne Reno: [phone]  In the event of inclement weather, please call our main line at [phone] for an update on the status of any delays or closures.  Dermatology Medication Tips: Please keep the boxes that topical medications come in in order to help keep track of the instructions about where and how to use these. Pharmacies typically print the medication instructions only on the boxes and not directly on the medication tubes.   If your medication is too expensive, please contact our office at [phone] option 4 or send [place] a message through MyChart.   We are unable to tell what your co-pay for medications will be in advance as this is different depending on your insurance coverage. However, we may be able to find a substitute medication at lower cost or fill out paperwork to get insurance to cover a needed medication.   If a prior authorization is required to get your medication covered by your insurance company, please allow [place] 1-2 business days to  complete this process.  Drug prices often vary depending on where the prescription is filled and some pharmacies may offer cheaper prices.  The website www.goodrx.com contains coupons for medications through different pharmacies. The prices here do not account for what the cost may be with help from insurance (it may be cheaper with your insurance), but the website can give you the price if you did not use any insurance.  - You can print the associated coupon and take it with your  prescription to the pharmacy.  - You may also stop by our office during regular business hours and pick up a GoodRx coupon card.  - If you need your prescription sent electronically to a different pharmacy, notify our office through [facility] or by phone at [phone] option 4.     Si Usted Necesita Algo Despus de Su Visita  Tambin puede enviarnos un mensaje a travs de Clinical cytogeneticist. Por lo general respondemos a los mensajes de MyChart en el transcurso de 1 a 2 das hbiles.  Para renovar recetas, por favor pida a su farmacia que se ponga en contacto con nuestra oficina. Annie Sable de fax es [place] [phone].  Si tiene un asunto urgente cuando la clnica est cerrada y que no puede esperar hasta el siguiente da hbil, puede llamar/localizar a su doctor(a) al nmero que aparece a continuacin.   Por favor, tenga en cuenta que aunque hacemos todo lo posible para estar disponibles para asuntos urgentes fuera del horario de [place], no estamos disponibles las 24 horas del da, los 7 [address]  [address] de la [place].   Si tiene un problema urgente y no puede comunicarse con nosotros, puede optar por buscar atencin mdica  en el consultorio de su doctor(a), en una clnica privada, en un centro de atencin urgente o en una sala de emergencias.  Si tiene Engineer, drilling, por favor llame inmediatamente al 911 o vaya a la sala de emergencias.  Nmeros de bper  - Dr. Gwen Pounds: [phone]  - Dra. Moye: [phone]  - Dra. Roseanne Reno: [phone]  En caso de inclemencias del [place], por favor llame a Lacy Duverney principal al [phone] para una actualizacin sobre el [place] de cualquier retraso o cierre.  Consejos para la medicacin en dermatologa: Por favor, guarde las cajas en las que vienen los medicamentos de uso tpico para ayudarle a seguir las instrucciones sobre dnde y cmo usarlos. Las farmacias generalmente imprimen las instrucciones del medicamento slo en las cajas y no  directamente en los tubos del [place].   Si su medicamento es muy caro, por favor, pngase en contacto con Rolm Gala llamando al [phone] y presione la opcin 4 o envenos un mensaje a travs de Clinical cytogeneticist.   No podemos decirle cul ser su copago por los medicamentos por adelantado ya que esto es diferente dependiendo de la cobertura de su seguro. Sin embargo, es posible que podamos encontrar un medicamento sustituto a Audiological scientist un formulario para que el seguro cubra el medicamento que se considera necesario.   Si se requiere una autorizacin previa para que su compaa de seguros [place] su medicamento, por favor permtanos de 1 a 2 das hbiles para completar [address]  Los precios de los medicamentos varan con frecuencia dependiendo del Environmental consultant de dnde se surte la receta y alguna farmacias pueden ofrecer precios ms baratos.  El sitio web www.goodrx.com tiene cupones para medicamentos de Health and safety inspector. Los precios aqu no tienen en cuenta lo que podra costar con la ayuda del  seguro (puede ser ms barato con su seguro), pero el sitio web puede darle el precio si no Visual merchandiser.  - Puede imprimir el cupn correspondiente y llevarlo con su receta a la farmacia.  - Tambin puede pasar por nuestra oficina durante el horario de atencin regular y Education officer, museum una tarjeta de cupones de GoodRx.  - Si necesita que su receta se enve electrnicamente a una farmacia diferente, informe a nuestra oficina a travs de MyChart de [place] o por telfono llamando al [phone] y presione la opcin 4.

## 2023-03-02 ENCOUNTER — Other Ambulatory Visit: Payer: Self-pay

## 2023-03-02 MED ORDER — HALOBETASOL PROPIONATE 0.05 % EX FOAM: CUTANEOUS | 3 refills | Status: DC

## 2023-03-02 NOTE — Progress Notes (Signed)
Fax from [facility] requesting RX. aw

## 2023-05-01 MED ORDER — CELECOXIB 200 MG PO CAPS
200 mg | ORAL_CAPSULE | Freq: Two times a day (BID) | ORAL | 2 refills | Status: AC
Start: 2023-05-01 — End: ?

## 2023-06-08 ENCOUNTER — Ambulatory Visit: Payer: PPO | Admitting: Dermatology

## 2023-06-08 VITALS — BP 188/100 | HR 86

## 2023-06-08 DIAGNOSIS — Z85828 Personal history of other malignant neoplasm of skin: Secondary | ICD-10-CM

## 2023-06-08 DIAGNOSIS — C44212 Basal cell carcinoma of skin of right ear and external auricular canal: Secondary | ICD-10-CM | POA: Diagnosis not present

## 2023-06-08 DIAGNOSIS — L821 Other seborrheic keratosis: Secondary | ICD-10-CM | POA: Diagnosis not present

## 2023-06-08 DIAGNOSIS — L408 Other psoriasis: Secondary | ICD-10-CM | POA: Diagnosis not present

## 2023-06-08 DIAGNOSIS — Z1283 Encounter for screening for malignant neoplasm of skin: Secondary | ICD-10-CM | POA: Diagnosis not present

## 2023-06-08 DIAGNOSIS — L578 Other skin changes due to chronic exposure to nonionizing radiation: Secondary | ICD-10-CM | POA: Diagnosis not present

## 2023-06-08 DIAGNOSIS — L814 Other melanin hyperpigmentation: Secondary | ICD-10-CM | POA: Diagnosis not present

## 2023-06-08 DIAGNOSIS — D485 Neoplasm of uncertain behavior of skin: Secondary | ICD-10-CM

## 2023-06-08 DIAGNOSIS — D229 Melanocytic nevi, unspecified: Secondary | ICD-10-CM

## 2023-06-08 DIAGNOSIS — L820 Inflamed seborrheic keratosis: Secondary | ICD-10-CM

## 2023-06-08 DIAGNOSIS — L219 Seborrheic dermatitis, unspecified: Secondary | ICD-10-CM | POA: Diagnosis not present

## 2023-06-08 DIAGNOSIS — W908XXA Exposure to other nonionizing radiation, initial encounter: Secondary | ICD-10-CM | POA: Diagnosis not present

## 2023-06-08 DIAGNOSIS — Z8589 Personal history of malignant neoplasm of other organs and systems: Secondary | ICD-10-CM

## 2023-06-08 DIAGNOSIS — L570 Actinic keratosis: Secondary | ICD-10-CM | POA: Diagnosis not present

## 2023-06-08 DIAGNOSIS — Z86018 Personal history of other benign neoplasm: Secondary | ICD-10-CM | POA: Diagnosis not present

## 2023-06-08 MED ORDER — KETOCONAZOLE 2 % EX SHAM: 1.0000 | MEDICATED_SHAMPOO | CUTANEOUS | 6 refills | Status: AC

## 2023-06-08 MED ORDER — HALOBETASOL PROPIONATE 0.05 % EX FOAM: CUTANEOUS | 3 refills | Status: AC

## 2023-06-08 MED ORDER — ZORYVE 0.3 % EX FOAM: 1.0000 | Freq: Every day | CUTANEOUS | 5 refills | Status: AC

## 2023-06-08 MED ORDER — VTAMA 1 % EX CREA: TOPICAL_CREAM | CUTANEOUS | 5 refills | Status: AC

## 2023-06-08 NOTE — Progress Notes (Signed)
Follow-Up Visit   Subjective  Ryan Blevins is a 72 y.o. male who presents for the following: Skin Cancer Screening and Full Body Skin Exam  The patient presents for Total-Body Skin Exam (TBSE) for skin cancer screening and mole check. The patient has spots, moles and lesions to be evaluated, some may be new or changing and the patient may have concern these could be cancer.   The following portions of the chart were reviewed this encounter and updated as appropriate: medications, allergies, medical history  Review of Systems:  No other skin or systemic complaints except as noted in HPI or Assessment and Plan.  Objective  Well appearing patient in no apparent distress; mood and affect are within normal limits.  A full examination was performed including scalp, head, eyes, ears, nose, lips, neck, chest, axillae, abdomen, back, buttocks, bilateral upper extremities, bilateral lower extremities, hands, feet, fingers, toes, fingernails, and toenails. All findings within normal limits unless otherwise noted below.   Relevant physical exam findings are noted in the Assessment and Plan.  R forehead x 2 (2) Erythematous thin papules/macules with gritty scale.      arms and hands x 5 (5) Erythematous stuck-on, waxy papule or plaque  R ear middle helix Crusted papule 1.5 cm         Assessment & Plan   SKIN CANCER SCREENING PERFORMED TODAY.  ACTINIC DAMAGE - Chronic condition, secondary to cumulative UV/sun exposure - diffuse scaly erythematous macules with underlying dyspigmentation - Recommend daily broad spectrum sunscreen SPF 30+ to sun-exposed areas, reapply every 2 hours as needed.  - Staying in the shade or wearing long sleeves, sun glasses (UVA+UVB protection) and wide brim hats (4-inch brim around the entire circumference of the hat) are also recommended for sun protection.  - Call for new or changing lesions.  LENTIGINES, SEBORRHEIC KERATOSES, HEMANGIOMAS -  Benign normal skin lesions - Benign-appearing - Call for any changes  MELANOCYTIC NEVI - Tan-brown and/or pink-flesh-colored symmetric macules and papules - Benign appearing on exam today - Observation - Call clinic for new or changing moles - Recommend daily use of broad spectrum spf 30+ sunscreen to sun-exposed areas.   Sebopsoriasis/PSORIASIS Exam: Well-demarcated erythematous papules/plaques with silvery scale, guttate pink scaly papules. 5% BSA.  Chronic and persistent condition with duration or expected duration over one year. Condition is symptomatic/ bothersome to patient. Not currently at goal.  Psoriasis is a chronic non-curable, but treatable genetic/hereditary disease that may have other systemic features affecting other organ systems such as joints (Psoriatic Arthritis). It is associated with an increased risk of inflammatory bowel disease, heart disease, non-alcoholic fatty liver disease, and depression.  Treatments include light and laser treatments; topical medications; and systemic medications including oral and injectables.  Treatment Plan: Samples given of Zoryve foam to aa's QD PRN. Samples given of Vtama apply to aa's QD PRN. Start Halobetasol foam to aa's scalp QD. Recommend Prosoria QD PRN.   AK (actinic keratosis) (2) R forehead x 2  Actinic keratoses are precancerous spots that appear secondary to cumulative UV radiation exposure/sun exposure over time. They are chronic with expected duration over 1 year. A portion of actinic keratoses will progress to squamous cell carcinoma of the skin. It is not possible to reliably predict which spots will progress to skin cancer and so treatment is recommended to prevent development of skin cancer.  Recommend daily broad spectrum sunscreen SPF 30+ to sun-exposed areas, reapply every 2 hours as needed.  Recommend staying in the  shade or wearing long sleeves, sun glasses (UVA+UVB protection) and wide brim hats (4-inch brim  around the entire circumference of the hat). Call for new or changing lesions.   Destruction of lesion - R forehead x 2 (2) Complexity: simple   Destruction method: cryotherapy   Informed consent: discussed and consent obtained   Timeout:  patient name, date of birth, surgical site, and procedure verified Lesion destroyed using liquid nitrogen: Yes   Region frozen until ice ball extended beyond lesion: Yes   Outcome: patient tolerated procedure well with no complications   Post-procedure details: wound care instructions given    Inflamed seborrheic keratosis (5) arms and hands x 5  Symptomatic, irritating, patient would like treated.   Destruction of lesion - arms and hands x 5 (5) Complexity: simple   Destruction method: cryotherapy   Informed consent: discussed and consent obtained   Timeout:  patient name, date of birth, surgical site, and procedure verified Lesion destroyed using liquid nitrogen: Yes   Region frozen until ice ball extended beyond lesion: Yes   Outcome: patient tolerated procedure well with no complications   Post-procedure details: wound care instructions given    Neoplasm of uncertain behavior of skin R ear middle helix  Epidermal / dermal shaving  Lesion diameter (cm):  1.5 Informed consent: discussed and consent obtained   Timeout: patient name, date of birth, surgical site, and procedure verified   Procedure prep:  Patient was prepped and draped in usual sterile fashion Prep type:  Isopropyl alcohol Anesthesia: the lesion was anesthetized in a standard fashion   Anesthetic:  1% lidocaine w/ epinephrine 1-100,000 buffered w/ 8.4% NaHCO3 Instrument used: flexible razor blade   Hemostasis achieved with: pressure, aluminum chloride and electrodesiccation   Outcome: patient tolerated procedure well   Post-procedure details: sterile dressing applied and wound care instructions given   Dressing type: bandage and petrolatum    Destruction of  lesion Complexity: extensive   Destruction method: electrodesiccation and curettage   Informed consent: discussed and consent obtained   Timeout:  patient name, date of birth, surgical site, and procedure verified Procedure prep:  Patient was prepped and draped in usual sterile fashion Prep type:  Isopropyl alcohol Anesthesia: the lesion was anesthetized in a standard fashion   Anesthetic:  1% lidocaine w/ epinephrine 1-100,000 buffered w/ 8.4% NaHCO3 Curettage performed in three different directions: Yes   Electrodesiccation performed over the curetted area: Yes   Lesion length (cm):  1.5 Lesion width (cm):  1.5 Margin per side (cm):  0.2 Final wound size (cm):  1.9 Hemostasis achieved with:  pressure, aluminum chloride and electrodesiccation Outcome: patient tolerated procedure well with no complications   Post-procedure details: sterile dressing applied and wound care instructions given   Dressing type: bandage and petrolatum    Specimen 1 - Surgical pathology Differential Diagnosis: D48.5 r/o SCC vs BCC vs AK vs other Check Margins: No  Seborrheic dermatitis  Related Medications Roflumilast (ZORYVE) 0.3 % CREA Apply 1 Application topically as directed. Apply to aa scalp 3 days a week at bedtime Tuesday, Thursday and Saturday  ketoconazole (NIZORAL) 2 % shampoo Apply 1 Application topically 3 (three) times a week. Wash scalp 3 times weekly, let sit for 5 minutes and rinse out  HISTORY OF BASAL CELL CARCINOMA OF THE SKIN - No evidence of recurrence today - Recommend regular full body skin exams - Recommend daily broad spectrum sunscreen SPF 30+ to sun-exposed areas, reapply every 2 hours as needed.  - Call  if any new or changing lesions are noted between office visits  HISTORY OF SQUAMOUS CELL CARCINOMA OF THE SKIN - No evidence of recurrence today - No lymphadenopathy - Recommend regular full body skin exams - Recommend daily broad spectrum sunscreen SPF 30+ to  sun-exposed areas, reapply every 2 hours as needed.  - Call if any new or changing lesions are noted between office visits  HISTORY OF DYSPLASTIC NEVUS No evidence of recurrence today Recommend regular full body skin exams Recommend daily broad spectrum sunscreen SPF 30+ to sun-exposed areas, reapply every 2 hours as needed.  Call if any new or changing lesions are noted between office visits  Return in about 6 months (around 12/06/2023) for UBSE.  Maylene Roes, CMA, am acting as scribe for Armida Sans, MD .   Documentation: I have reviewed the above documentation for accuracy and completeness, and I agree with the above.  Armida Sans, MD

## 2023-06-08 NOTE — Patient Instructions (Addendum)

## 2023-06-12 LAB — SURGICAL PATHOLOGY

## 2023-06-13 ENCOUNTER — Encounter: Payer: Self-pay | Admitting: Dermatology

## 2023-06-13 ENCOUNTER — Telehealth: Payer: Self-pay

## 2023-06-13 NOTE — Telephone Encounter (Signed)
-----   Message from Armida Sans sent at 06/13/2023 10:40 AM EDT ----- FINAL DIAGNOSIS        1. Skin, right ear middle helix :       BASAL CELL CARCINOMA, NODULAR AND INFILTRATIVE PATTERNS   Cancer = BCC Already treated Recheck next visit

## 2023-06-13 NOTE — Telephone Encounter (Signed)
discussed biopsy results with patient

## 2023-09-22 DIAGNOSIS — Z961 Presence of intraocular lens: Secondary | ICD-10-CM | POA: Diagnosis not present

## 2023-09-22 DIAGNOSIS — H43813 Vitreous degeneration, bilateral: Secondary | ICD-10-CM | POA: Diagnosis not present

## 2023-10-08 ENCOUNTER — Other Ambulatory Visit: Payer: Self-pay | Admitting: Family Medicine

## 2023-10-08 DIAGNOSIS — Z125 Encounter for screening for malignant neoplasm of prostate: Secondary | ICD-10-CM

## 2023-10-08 DIAGNOSIS — E782 Mixed hyperlipidemia: Secondary | ICD-10-CM

## 2023-10-08 DIAGNOSIS — E790 Hyperuricemia without signs of inflammatory arthritis and tophaceous disease: Secondary | ICD-10-CM

## 2023-10-10 ENCOUNTER — Other Ambulatory Visit: Payer: Self-pay | Admitting: Family Medicine

## 2023-10-12 ENCOUNTER — Other Ambulatory Visit (INDEPENDENT_AMBULATORY_CARE_PROVIDER_SITE_OTHER): Payer: PPO

## 2023-10-12 ENCOUNTER — Ambulatory Visit: Payer: PPO

## 2023-10-12 VITALS — Ht 72.0 in | Wt 201.0 lb

## 2023-10-12 DIAGNOSIS — Z0000 Encounter for general adult medical examination without abnormal findings: Secondary | ICD-10-CM | POA: Diagnosis not present

## 2023-10-12 DIAGNOSIS — E782 Mixed hyperlipidemia: Secondary | ICD-10-CM | POA: Diagnosis not present

## 2023-10-12 DIAGNOSIS — Z125 Encounter for screening for malignant neoplasm of prostate: Secondary | ICD-10-CM

## 2023-10-12 DIAGNOSIS — E790 Hyperuricemia without signs of inflammatory arthritis and tophaceous disease: Secondary | ICD-10-CM

## 2023-10-12 LAB — LIPID PANEL
Cholesterol: 153 mg/dL (ref 0–200)
HDL: 35.2 mg/dL — ABNORMAL LOW (ref >39.00)
LDL Cholesterol: 70 mg/dL (ref 0–99)
NonHDL: 117.67
Total CHOL/HDL Ratio: 4
Triglycerides: 238 mg/dL — ABNORMAL HIGH (ref 0.0–149.0)
VLDL: 47.6 mg/dL — ABNORMAL HIGH (ref 0.0–40.0)

## 2023-10-12 LAB — COMPREHENSIVE METABOLIC PANEL
ALT: 25 U/L (ref 0–53)
AST: 23 U/L (ref 0–37)
Albumin: 4.6 g/dL (ref 3.5–5.2)
Alkaline Phosphatase: 82 U/L (ref 39–117)
BUN: 15 mg/dL (ref 6–23)
CO2: 29 meq/L (ref 19–32)
Calcium: 9.9 mg/dL (ref 8.4–10.5)
Chloride: 100 meq/L (ref 96–112)
Creatinine, Ser: 1.18 mg/dL (ref 0.40–1.50)
GFR: 61.75 mL/min (ref >60.00)
Glucose, Bld: 122 mg/dL — ABNORMAL HIGH (ref 70–99)
Potassium: 4.7 meq/L (ref 3.5–5.1)
Sodium: 139 meq/L (ref 135–145)
Total Bilirubin: 0.6 mg/dL (ref 0.2–1.2)
Total Protein: 7.2 g/dL (ref 6.0–8.3)

## 2023-10-12 LAB — URIC ACID: Uric Acid, Serum: 8.6 mg/dL — ABNORMAL HIGH (ref 4.0–7.8)

## 2023-10-12 LAB — PSA, MEDICARE: PSA: 0.01 ng/mL — ABNORMAL LOW (ref 0.10–4.00)

## 2023-10-12 NOTE — Patient Instructions (Addendum)
Mr. Ryan Blevins , Thank you for taking time to come for your Medicare Wellness Visit. I appreciate your ongoing commitment to your health goals. Please review the following plan we discussed and let me know if I can assist you in the future.   Referrals/Orders/Follow-Ups/Clinician Recommendations: none  This is a list of the screening recommended for you and due dates:  Health Maintenance  Topic Date Due   COVID-19 Vaccine (7 - 2024-25 season) 09/02/2023   Medicare Annual Wellness Visit  10/11/2024   Colon Cancer Screening  11/10/2026   DTaP/Tdap/Td vaccine (3 - Tdap) 01/27/2033   Pneumonia Vaccine  Completed   Flu Shot  Completed   Hepatitis C Screening  Completed   Zoster (Shingles) Vaccine  Completed   HPV Vaccine  Aged Out    Advanced directives: (Copy Requested) Please bring a copy of your health care power of attorney and living will to the office to be added to your chart at your convenience.  Next Medicare Annual Wellness Visit scheduled for next year: Yes 10/14/24 @ 10:10am televisit

## 2023-10-12 NOTE — Progress Notes (Signed)
Subjective:   Ryan Blevins is a 73 y.o. male who presents for Medicare Annual/Subsequent preventive examination.  Visit Complete: Virtual I connected with  Shawna Orleans on 10/12/23 by a audio enabled telemedicine application and verified that I am speaking with the correct person using two identifiers.  Patient Location: Home  Provider Location: Home Office  I discussed the limitations of evaluation and management by telemedicine. The patient expressed understanding and agreed to proceed.  Vital Signs: Because this visit was a virtual/telehealth visit, some criteria may be missing or patient reported. Any vitals not documented were not able to be obtained and vitals that have been documented are patient reported.  Patient Medicare AWV questionnaire was completed by the patient on 10/09/2023; I have confirmed that all information answered by patient is correct and no changes since this date.  Cardiac Risk Factors include: advanced age (>11men, >55 women);dyslipidemia;hypertension;male gender;sedentary lifestyle     Objective:    Today's Vitals   10/12/23 0933  Weight: 201 lb (91.2 kg)  Height: 6' (1.829 m)  PainSc: 0-No pain   Body mass index is 27.26 kg/m.     10/12/2023    9:45 AM 10/10/2022   10:51 AM 07/11/2022    9:54 AM 11/10/2021    9:13 AM 10/24/2018    8:22 AM 07/26/2017    7:16 AM  Advanced Directives  Does Patient Have a Medical Advance Directive? Yes No Yes Yes Yes No  Type of Estate agent of [place];Living will  Healthcare Power of City of [place];Living will Healthcare Power of [place];Living will Healthcare Power of [place];Living will   Does patient want to make changes to medical advance directive?   No - Patient declined     Copy of Healthcare Power of Attorney in Chart? No - copy requested  No - copy requested     Would patient like information on creating a medical advance directive?  No - Patient declined        Current  Medications (verified) Outpatient Encounter Medications as of 10/12/2023  Medication Sig   ASPIRIN 81 PO Take by mouth daily.   cetirizine (ZYRTEC) 10 MG tablet Take 10 mg by mouth daily.   colchicine 0.6 MG tablet Take 1 tablet (0.6 mg total) by mouth daily as needed.   Efinaconazole (JUBLIA) 10 % SOLN Apply 1 Application topically at bedtime. Apply to affected toenails qhs   fluticasone (FLONASE) 50 MCG/ACT nasal spray SHAKE LIQUID AND USE 2 SPRAYS IN EACH NOSTRIL DAILY   Halobetasol Propionate 0.05 % FOAM Apply to aa's scalp QD PRN. Avoid applying to face, groin, and axilla. Use as directed. Long-term use can cause thinning of the skin.   ketoconazole (NIZORAL) 2 % shampoo Apply 1 Application topically 3 (three) times a week. Wash scalp 3 times weekly, let sit for 5 minutes and rinse out   lisinopril (ZESTRIL) 30 MG tablet Take 1 tablet (30 mg total) by mouth daily.   Roflumilast (ZORYVE) 0.3 % CREA Apply 1 Application topically as directed. Apply to aa scalp 3 days a week at bedtime Tuesday, Thursday and Saturday   Roflumilast, Antiseborrheic, (ZORYVE) 0.3 % FOAM Apply 1 Application topically at bedtime.   rosuvastatin (CRESTOR) 10 MG tablet TAKE ONE TABLET BY MOUTH DAILY   sildenafil (REVATIO) 20 MG tablet Take 1-3 tablets (20-60 mg total) by mouth daily as needed.   tamsulosin (FLOMAX) 0.4 MG CAPS capsule TAKE 1 CAPSULE BY MOUTH DAILY   Tapinarof (VTAMA) 1 % CREA Apply to  aa's psoriasis QD PRN.   venlafaxine XR (EFFEXOR-XR) 75 MG 24 hr capsule Take 2 capsules (150 mg total) by mouth daily.   No facility-administered encounter medications on file as of 10/12/2023.    Allergies (verified) Atorvastatin, Bupropion hcl, and Codeine   History: Past Medical History:  Diagnosis Date   Actinic keratosis 04/18/2018   Left nasal ala. Hypertrophic   Basal cell carcinoma 02/25/2010   Left med. pretibial. Superficial with focal infiltration.   Basal cell carcinoma 04/29/2013   Right superior  helix. Excised: 06/12/2013   Basal cell carcinoma 11/09/2020   left medial upper eyelid - MOHs   Basal cell carcinoma 06/08/2023   right ear middle helix, EDC   Depression    Dysplastic nevus 08/19/2015   Left med medial calf. DN with severe atypia, lateral margin involved. Excised: 09/01/2015, margins free.   Hemorrhoid    Hyperlipidemia    Hypertension    Hyperuricemia    Low testosterone 2011   Prostate cancer (HCC) 2011   per Dr. Isabel Caprice.  Seed implant per Dr. Dayton Scrape   Skin cancer    Squamous cell carcinoma of skin 11/24/2021   Right Ear at the superior antihelix - tx with ED&C   Squamous cell carcinoma of skin 01/24/2022   right hand dorsum, EDC   Syncope    Wears hearing aid in both ears    Past Surgical History:  Procedure Laterality Date   CATARACT EXTRACTION W/PHACO Left 03/28/2022   Procedure: CATARACT EXTRACTION PHACO AND INTRAOCULAR LENS PLACEMENT (IOC) LEFT eyhance toric lens 4.89 00:43.1;  Surgeon: Nevada Crane, MD;  Location: [facility];  Service: Ophthalmology;  Laterality: Left;   CATARACT EXTRACTION W/PHACO Right 07/11/2022   Procedure: CATARACT EXTRACTION PHACO AND INTRAOCULAR LENS PLACEMENT (IOC) RIGHT;  Surgeon: Nevada Crane, MD;  Location: [facility];  Service: Ophthalmology;  Laterality: Right;  6.78 0:52.8   COLONOSCOPY  02/20/2009   Dr Lemar Livings   COLONOSCOPY WITH PROPOFOL N/A 10/24/2018   Procedure: COLONOSCOPY WITH PROPOFOL;  Surgeon: Earline Mayotte, MD;  Location: [facility];  Service: Endoscopy;  Laterality: N/A;   COLONOSCOPY WITH PROPOFOL N/A 11/10/2021   Procedure: COLONOSCOPY WITH PROPOFOL;  Surgeon: Earline Mayotte, MD;  Location: [facility];  Service: Endoscopy;  Laterality: N/A;   Family History  Problem Relation Age of Onset   Heart disease Mother        Multiple heart surgeries, valve repair   Osteoporosis Mother        Vertebral fracture   Stroke Mother    Hypertension Father    Hyperlipidemia  Father    Diabetes Father    Parkinson's disease Father    Colon cancer Neg Hx    Social History   Socioeconomic History   Marital status: Married    Spouse name: Not on file   Number of children: Not on file   Years of education: Not on file   Highest education level: Not on file  Occupational History   Occupation: Owns Engineer, site in [place]    Employer: [employer]  Tobacco Use   Smoking status: Never   Smokeless tobacco: Never  Vaping Use   Vaping status: Never Used  Substance and Sexual Activity   Alcohol use: Yes    Alcohol/week: 5.0 standard drinks of alcohol    Types: 5 Standard drinks or equivalent per week   Drug use: No   Sexual activity: Not on file  Other Topics Concern   Not  on file  Social History Narrative   From [place].  [employer] grad, doctorate in Counseling, [place]. Worked in [employer].   [place]   Works in [employer] 2012, in relationship for 20+ years   Social Drivers of [employer]: Low Risk  (10/12/2023)   Overall Financial Resource Strain (CARDIA)    Difficulty of Paying Living Expenses: Not hard at all  Food Insecurity: No Food Insecurity (10/12/2023)   Hunger Vital Sign    Worried About Running Out of Food in the Last Year: Never true    Ran Out of Food in the Last Year: Never true  Transportation Needs: No Transportation Needs (10/12/2023)   PRAPARE - Administrator, Civil Service (Medical): No    Lack of Transportation (Non-Medical): No  Physical Activity: Inactive (10/12/2023)   Exercise Vital Sign    Days of Exercise per Week: 0 days    Minutes of Exercise per Session: 0 min  Stress: No Stress Concern Present (10/12/2023)   [employer] of Occupational Health - Occupational Stress Questionnaire    Feeling of Stress : Only a little  Social Connections: Moderately Integrated (10/12/2023)   Social Connection and Isolation Panel [NHANES]    Frequency of Communication with Friends and  Family: More than three times a week    Frequency of Social Gatherings with Friends and Family: More than three times a week    Attends Religious Services: More than 4 times per year    Active Member of [employer] or Organizations: No    Attends Engineer, structural: Never    Marital Status: Married    Tobacco Counseling Counseling given: Not Answered  Clinical Intake:  Pre-visit preparation completed: Yes  Pain : No/denies pain Pain Score: 0-No pain    BMI - recorded: 27.26 Nutritional Status: BMI 25 -29 Overweight Nutritional Risks: None Diabetes: No  How often do you need to have someone help you when you read instructions, pamphlets, or other written materials from your doctor or pharmacy?: 1 - Never  Interpreter Needed?: No  Comments: lives with wife Information entered by :: B.Davarius Ridener,LPN  Activities of Daily Living    10/09/2023    7:02 PM  In your present state of health, do you have any difficulty performing the following activities:  Hearing? 0  Vision? 0  Difficulty concentrating or making decisions? 0  Walking or climbing stairs? 0  Dressing or bathing? 0  Doing errands, shopping? 0  Preparing Food and eating ? N  Using the Toilet? N  In the past six months, have you accidently leaked urine? N  Do you have problems with loss of bowel control? N  Managing your Medications? N  Managing your Finances? N  Housekeeping or managing your Housekeeping? N    Patient Care Team: Joaquim Nam, MD as PCP - General (Family Medicine) Barron Alvine, MD (Inactive) as Consulting Physician (Urology) Chipper Herb, MD (Inactive) as Consulting Physician (Radiation Oncology) Lemar Livings Merrily Pew, MD (General Surgery) Crist Fat, MD as Attending Physician (Urology) Nevada Crane, MD as Consulting Physician (Ophthalmology)  Indicate any recent Medical Services you may have received from other than [facility] providers in the past year (date may be  approximate).     Assessment:   This is a routine wellness examination for [place].  Hearing/Vision screen Hearing Screening - Comments:: Pt says his hearing is good with hearing aids Vision Screening - Comments:: Pt says his vision is good  after cataract surgery [facility]   Goals Addressed             This Visit's Progress    DIET - EAT MORE FRUITS AND VEGETABLES   Not on track      Depression Screen    10/12/2023    9:42 AM 10/17/2022    8:31 AM 10/10/2022   10:50 AM 09/21/2021    2:08 PM 09/21/2021   10:59 AM 06/11/2021   11:15 AM 06/11/2021   11:14 AM  PHQ 2/9 Scores  PHQ - 2 Score 0 4 0 0 0 0 0  PHQ- 9 Score  9 0 0  0     Fall Risk    10/09/2023    7:02 PM 10/10/2022   10:52 AM 09/21/2021   10:59 AM 10/20/2020   12:10 PM 04/14/2020    2:57 PM  Fall Risk   Falls in the past year? 0 0 0 0 0  Comment     Emmi Telephone Survey: data to providers prior to load  Number falls in past yr: 0 0 0 0   Injury with Fall? 0 0 0 0   Risk for fall due to : No Fall Risks No Fall Risks No Fall Risks    Follow up Education provided;Falls prevention discussed Falls prevention discussed;Falls evaluation completed Falls evaluation completed Falls evaluation completed     MEDICARE RISK AT HOME: Medicare Risk at Home Any stairs in or around the home?: (Patient-Rptd) Yes If so, are there any without handrails?: (Patient-Rptd) Yes Home free of loose throw rugs in walkways, pet beds, electrical cords, etc?: (Patient-Rptd) Yes Adequate lighting in your home to reduce risk of falls?: (Patient-Rptd) Yes Life alert?: (Patient-Rptd) No Use of a cane, walker or w/c?: (Patient-Rptd) No Grab bars in the bathroom?: (Patient-Rptd) No Shower chair or bench in shower?: (Patient-Rptd) No Elevated toilet seat or a handicapped toilet?: (Patient-Rptd) No  TIMED UP AND GO:  Was the test performed?  No    Cognitive Function:        10/12/2023    9:46 AM 10/10/2022   10:56 AM   6CIT Screen  What Year? 0 points 0 points  What month? 0 points 0 points  What time? 0 points 0 points  Count back from 20 0 points 0 points  Months in reverse 0 points 0 points  Repeat phrase 0 points 0 points  Total Score 0 points 0 points    Immunizations Immunization History  Administered Date(s) Administered   Dtap, Unspecified 01/28/2023   Fluad Quad(high Dose 65+) 09/19/2019, 07/08/2023   Influenza Split 06/08/2012   Influenza, High Dose Seasonal PF 06/23/2020, 07/15/2021, 07/18/2022   Influenza,inj,Quad PF,6+ Mos 08/12/2013, 08/15/2014, 08/17/2015, 08/19/2016, 08/22/2017, 08/30/2018   Moderna Sars-Covid-2 Vaccination 11/01/2019, 11/29/2019, 07/21/2020, 04/09/2021   PNEUMOCOCCAL CONJUGATE-20 07/15/2021   Pfizer Covid-19 Vaccine Bivalent Booster 53yrs & up 07/08/2023   Pfizer(Comirnaty)Fall Seasonal Vaccine 12 years and older 07/18/2022   Pneumococcal Conjugate-13 08/19/2016, 08/10/2020   Pneumococcal Polysaccharide-23 09/19/2008, 08/22/2017   Rsv, Bivalent, Protein Subunit Rsvpref,pf Verdis Frederickson) 07/18/2022   Td 08/15/2009   Zoster Recombinant(Shingrix) 04/20/2020, 06/19/2020    TDAP status: Up to date  Flu Vaccine status: Up to date  Pneumococcal vaccine status: Up to date  Covid-19 vaccine status: Completed vaccines  Qualifies for Shingles Vaccine? Yes   Zostavax completed Yes   Shingrix Completed?: Yes  Screening Tests Health Maintenance  Topic Date Due   COVID-19 Vaccine (7 - 2024-25 season) 09/02/2023   Medicare  Annual Wellness (AWV)  10/11/2024   Colonoscopy  11/10/2026   DTaP/Tdap/Td (3 - Tdap) 01/27/2033   Pneumonia Vaccine 17+ Years old  Completed   INFLUENZA VACCINE  Completed   Hepatitis C Screening  Completed   Zoster Vaccines- Shingrix  Completed   HPV VACCINES  Aged Out    Health Maintenance  Health Maintenance Due  Topic Date Due   COVID-19 Vaccine (7 - 2024-25 season) 09/02/2023    Colorectal cancer screening: Type of screening:  Colonoscopy. Completed 11/10/2021. Repeat every 5 years  Lung Cancer Screening: (Low Dose CT Chest recommended if Age 70-80 years, 20 pack-year currently smoking OR have quit w/in 15years.) does not qualify.   Lung Cancer Screening Referral: no  Additional Screening:  Hepatitis C Screening: does not qualify; Completed 07/15/2016  Vision Screening: Recommended annual ophthalmology exams for early detection of glaucoma and other disorders of the eye. Is the patient up to date with their annual eye exam?  Yes  Who is the provider or what is the name of the office in which the patient attends annual eye exams? Dr Brooke Dare If pt is not established with a provider, would they like to be referred to a provider to establish care? No .   Dental Screening: Recommended annual dental exams for proper oral hygiene  Diabetic Foot Exam: n/a  Community Resource Referral / Chronic Care Management: CRR required this visit?  No   CCM required this visit?  No    Plan:     I have personally reviewed and noted the following in the patient's chart:   Medical and social history Use of alcohol, tobacco or illicit drugs  Current medications and supplements including opioid prescriptions. Patient is not currently taking opioid prescriptions. Functional ability and status Nutritional status Physical activity Advanced directives List of other physicians Hospitalizations, surgeries, and ER visits in previous 12 months Vitals Screenings to include cognitive, depression, and falls Referrals and appointments  In addition, I have reviewed and discussed with patient certain preventive protocols, quality metrics, and best practice recommendations. A written personalized care plan for preventive services as well as general preventive health recommendations were provided to patient.    Sue Lush, LPN   [id number]   After Visit Summary: (MyChart) Due to this being a telephonic visit, the after visit  summary with patients personalized plan was offered to patient via MyChart   Nurse Notes: The patient states he is doing well and has no concerns or questions at this time.

## 2023-10-12 NOTE — Progress Notes (Signed)
I reviewed health advisor's note, was available for consultation on the day of service listed in this note, and agree with documentation and plan. Whisper Kurka, MD.   

## 2023-10-19 ENCOUNTER — Encounter: Payer: Self-pay | Admitting: Family Medicine

## 2023-10-19 ENCOUNTER — Ambulatory Visit (INDEPENDENT_AMBULATORY_CARE_PROVIDER_SITE_OTHER): Payer: PPO | Admitting: Family Medicine

## 2023-10-19 VITALS — BP 146/88 | HR 86 | Temp 98.6°F | Ht 71.5 in | Wt 207.2 lb

## 2023-10-19 DIAGNOSIS — Z0000 Encounter for general adult medical examination without abnormal findings: Secondary | ICD-10-CM

## 2023-10-19 DIAGNOSIS — E782 Mixed hyperlipidemia: Secondary | ICD-10-CM

## 2023-10-19 DIAGNOSIS — C61 Malignant neoplasm of prostate: Secondary | ICD-10-CM

## 2023-10-19 DIAGNOSIS — N529 Male erectile dysfunction, unspecified: Secondary | ICD-10-CM | POA: Diagnosis not present

## 2023-10-19 DIAGNOSIS — R739 Hyperglycemia, unspecified: Secondary | ICD-10-CM

## 2023-10-19 DIAGNOSIS — F339 Major depressive disorder, recurrent, unspecified: Secondary | ICD-10-CM

## 2023-10-19 DIAGNOSIS — I10 Essential (primary) hypertension: Secondary | ICD-10-CM

## 2023-10-19 DIAGNOSIS — J309 Allergic rhinitis, unspecified: Secondary | ICD-10-CM

## 2023-10-19 DIAGNOSIS — Z7189 Other specified counseling: Secondary | ICD-10-CM

## 2023-10-19 MED ORDER — VENLAFAXINE HCL ER 75 MG PO CP24: 150.0000 mg | ORAL_CAPSULE | Freq: Every day | ORAL | 3 refills | Status: DC

## 2023-10-19 MED ORDER — LISINOPRIL 30 MG PO TABS: 30.0000 mg | ORAL_TABLET | Freq: Every day | ORAL | 3 refills | Status: DC

## 2023-10-19 MED ORDER — FLUTICASONE PROPIONATE 50 MCG/ACT NA SUSP: NASAL | 3 refills | Status: DC

## 2023-10-19 MED ORDER — SILDENAFIL CITRATE 20 MG PO TABS: 80.0000 mg | ORAL_TABLET | Freq: Every day | ORAL | 12 refills | Status: DC | PRN

## 2023-10-19 MED ORDER — COLCHICINE 0.6 MG PO TABS: 0.6000 mg | ORAL_TABLET | Freq: Every day | ORAL | 1 refills | Status: DC | PRN

## 2023-10-19 MED ORDER — ROSUVASTATIN CALCIUM 10 MG PO TABS: ORAL_TABLET | ORAL | 3 refills | Status: DC

## 2023-10-19 NOTE — Progress Notes (Signed)
Flu 2024 Shingles previously done PNA previously done RSV prev done.   Tetanus 2024 COVID-vaccine previously done. Colonoscopy 2023 PSA screening neg 2025.   Advance directive-wife designated if patient were incapacitated.  D/w pt about low sugar diet and recheck at a visit in about 3 months. A1c at the visit.   ED d/w pt.  60mg  sildenafil didn't help, d/w pt.  No NTG use.    Some occ intermittent abd bloating that can self resolve.  He has loose stools at baseline.  Occ BRBPR attributed to a hemorrhoid.  No night sweats, no unintended weight loss.  I asked him to update me as needed.  Colonoscopy up to date.   Some lower back pain/top of pelvis- at the SI joint- pain crossing his leg when he has a flare, ie testing the SI joint.  No radicular pain since he stopped running.  Some pain between the shoulder blades but not exertional.    Hypertension:               Using medication without problems or lightheadedness: yes Chest pain with exertion:no Edema: some trace BLE edema in the AM, improved with movement.   Short of breath:no Labs d/w pt    Elevated Cholesterol: Using medications without problems: yes Muscle aches:  see above.  Not thought to be statin related.  Diet compliance: yes Exercise: yes   GERD.  He hasn't needed PPI, rare TUMS use.  He is monitoring his diet.     Mood d/w pt.  Wife had vision loss from glaucoma- she has f/u pending.  He hasn't had direct contact with his daughter and her kids.  D/w pt.  Still on effexor at baseline.     Rare gout sx.  Labs d/w pt.  He is paying attention to diet.  Colchicine helps, used early in the flare.    He had derm f/u pending, Dr. Gwen Pounds.    Meds, vitals, and allergies reviewed.   ROS: Per HPI unless specifically indicated in ROS section   GEN: nad, alert and oriented HEENT: ncat NECK: supple w/o LA CV: rrr PULM: ctab, no inc wob ABD: soft, +bs EXT: no edema SKIN: well perfused.

## 2023-10-19 NOTE — Patient Instructions (Addendum)
Use the low sugar diet and recheck at a visit in about 3 months. A1c at the visit.  Take care.  Glad to see you. Please update me as needed.

## 2023-10-22 DIAGNOSIS — R739 Hyperglycemia, unspecified: Secondary | ICD-10-CM | POA: Insufficient documentation

## 2023-10-22 NOTE — Assessment & Plan Note (Signed)
Flu 2024 Shingles previously done PNA previously done RSV prev done.   Tetanus 2024 COVID-vaccine previously done. Colonoscopy 2023 PSA screening neg 2025.   Advance directive-wife designated if patient were incapacitated.

## 2023-10-22 NOTE — Assessment & Plan Note (Signed)
Continue lisinopril, d/w pt about labs.

## 2023-10-22 NOTE — Assessment & Plan Note (Signed)
Can try up to 80-100mg  sildenafil with routine cautions.

## 2023-10-22 NOTE — Assessment & Plan Note (Signed)
Continue crestor. With likely aches from benign MSK source.  Labs d/w pt.

## 2023-10-22 NOTE — Assessment & Plan Note (Signed)
Advance directive- wife designated if patient were incapacitated.  

## 2023-10-22 NOTE — Assessment & Plan Note (Signed)
Wife had vision loss from glaucoma- she has f/u pending.  He hasn't had direct contact with his daughter and her kids.  D/w pt.  Still on effexor at baseline.  Would continue as is.

## 2023-10-22 NOTE — Assessment & Plan Note (Signed)
H/o, PSA still low.

## 2023-10-22 NOTE — Assessment & Plan Note (Signed)
D/w pt about low sugar diet and recheck at a visit in about 3 months. A1c at the visit.

## 2023-12-15 NOTE — Progress Notes
 Date: 12/15/2023  Name: Daryl Graves   MRN#: [id number]   DOB: 05-04-1951   Age: 73 y.o.      Lavonia Powers L. Elona Hal, MD  Patria Bookbinder PA-C      CHIEF COMPLAINT: Left shoulder pain    REFERRING PHYSICIAN: Elois Hair, MD    HISTORY PRESENT ILLNESS: The patient is a 73 y.o. year old Male who presents to the office for an evaluation of his left shoulder. The patient denies any injury to the left shoulder, however, notes persistent pain for approximately several years. Patient reports undergoing a left shoulder rotator cuff repair with [facility] joint resection with Dr. Mignon Alberts in the 1990's. Most recently, he denotes pain down the anterolateral aspect of the left shoulder, and difficulty with sleeping on his left side.     The patient reports dull to sharp to stabbing pain in the left shoulder rated at a 5/10 in severity. The patient has increased discomfort with heavy lifting, pushing, pulling, and overhead activities. Overall, the patient reports his condition is staying the same. The patient has attempted and failed conservative management consisting of anti-inflammatory medications, multiple cortisone injections, bracing, home exercises, and formal physical therapy for the past 3 years without alleviation of his symptoms.The patient is accompanied by his wife on today's visit.    PREVIOUS MEDICAL HISTORY:   Past Medical History:   Diagnosis Date    Arthritis     Coccidioidomycosis     Diabetes mellitus (HCC/RAF)     Peripheral vascular disease (HCC/RAF)     Sleep apnea        PREVIOUS SURGICAL HISTORY: No past surgical history on file.    MEDICATIONS:   Current Outpatient Medications   Medication Sig    atorvastatin 10 mg tablet TAKE 1 TABLET BY MOUTH AT BEDTIME    azaTHIOprine 50 mg tablet TAKE TWO TABLETS BY MOUTH EVERY DAY    CELECOXIB 200 mg capsule TAKE 1 CAPSULE BY MOUTH TWO TIMES DAILY.    Diclofenac Sodium (PENNSAID) 2 % SOLN Apply 2 sprays to area twice daily.    diclofenac Sodium 3% gel Apply 0.5 g topically two (2) times daily.    FARXIGA 5 MG tablet TAKE 1 TABLET BY MOUTH DAILY    finasteride 5 mg tablet TAKE 1 TABLET BY MOUTH EVERY DAY    LUMIGAN 0.01 % ophthalmic solution INSTILL 1 DROP IN EACH EYE EVERY EVENING    mesalamine (LIALDA) 1.2 g DR tablet TAKE 4 TABLETS BY MOUTH DAILY    pregabalin 75 mg capsule One pill nightly for 3 days; One pill twice daily for 3 days; One pill three times daily after.    pregabalin 75 mg capsule One pill nightly for 3 days; One pill twice daily for 3 days; One pill three times daily after.    RYBELSUS 3 MG TABS TAKE 1 TABLET BY MOUTH ONCE DAILY AT LEAST 30 MINUTES BEFORE FIRST FOOD, BEVERAGE, OR OTHER ORAL MEDS    tamsulosin 0.4 mg capsule TAKE ONE CAPSULE BY MOUTH TWO TIMES A DAY     No current facility-administered medications for this visit.       ALLERGIES:   Allergies   Allergen Reactions    Ibuprofen Other (See Comments)     Soars in mouth       SOCIAL HISTORY:   Social History     Tobacco Use    Smoking status: Never    Smokeless tobacco: Never   Substance Use Topics  Alcohol use: Not Currently       FAMILY HISTORY:   Family History   Problem Relation Age of Onset    Cancer Father        REVIEW OF SYSTEMS: The 14-point review of systems as documented today in the medical record is remarkable for the positive orthopedic problems discussed above and their relevance was considered with respect to Constitutional, ENT, Cardiovascular, GU, Skin, Neurologic, Endocrine, Hematologic, Psychiatric, Gastrointestinal, Respiratory, Eyes and Allergic/Immunologic systems.    Physical Examination:  There were no vitals taken for this visit.    General: The patient is well-developed, well-nourished, and in no acute distress.  Body habitus is normal.  Patient is oriented ?3, to place, time and person.  Judgment, mood and affect are appropriate.  External exam of eyes, ears, nose and mouth reveal no deformities, scars or lesions.  Respirations are regular and unlabored. Respiratory effort is normal with no evidence of abnormal intercostal retraction, or excessive use of accessory muscles.  Extraocular movements are intact, pupils are symmetric.  No conjunctivitis or icterus is present.  Skin appears to be normal without rashes, lesions, or ulcers.  Pulses regular.  No cyanosis, clubbing or edema is evident.  Patient has no movement disorder or loss of sensation except as described below.  Gait and station are within normal limits except as described below.  No amputations are fixed deformities are evident.    Left Shoulder:   Inspection: No swelling is noted.  No scars are noted.  No atrophy of surrounding muscle structures was appreciated.  There is no deformity over the A/C joint.    Palpation:   Pain with palpation   []  None [x]  Mild  []  Moderate    []  Severe  {Blank single:19197::'' anterior aspect of the shoulder'',''over the posterior aspect of the shoulder'',''over the A.C. Joint'',''over the Biceps tendon'',''over the medial  scapular border '',''diffusely'',''over the anterolateral aspect of the shoulder''}.     Range of motion exam:  Forward flexion {Blank single:19197::''20'',''45'',''60'',''80'',''90'',''100'','' 110'',''120'',''130'',''140'',''150'',''160'',''170''}?  Abduction {Blank single:19197::''20'',''45'',''60'',''80'',''90'',''100'','' 110'',''120'',''130'',''140'',''150'',''160'',''170''}?   []   with   []   without pain.    External rotation: {Blank single:19197::''10'',''20'',''45'',''50'',''60'',''80'',''70''}    Internal rotation: {Blank single:19197::''T4'',''T7'',''T12'',''L3'',''L5'',''SI Joint'',''To the Hip'',''T10''}      Crepitus is []  Present  []  Not present with shoulder range of motion.  Scapular motion is normal.      Manual muscle strength testing:   Supraspinatus (abduction in the scapular plane): 5/5    Pain: []  None []  Mild  [x]  Moderate    []  Severe   Infraspinatus (external rotation, arm at side): 5/5   Pain:  [x]  None []  Mild  []  Moderate    []  Severe   Subscapularis(resisted belly press):  5/5     Pain: [x]  None []  Mild  []  Moderate []  Severe     Provocative Tests:   Hawkins Test:     [x]  Positive []  Negative []  Not Tested  Cross arm adduction test:  []  Positive [x]  Negative []  Not Tested  Speeds test:   [x]  Positive []  Negative []  Not Tested  O'Brien's test:  []  Positive [x]  Negative []  Not Tested  Apprehension/relocation:    []  Positive [x]  Negative []  Not Tested  Shoulder joint instability was not noted.  Sulcus sign was negative.  Drop arm is not present.    Neurologic: No gross motor or sensory deficits    Vascular: Distal pulses are intact    Skin: No rashes or lesions    X-rays: Images were  ordered, obtained and reviewed with the patient today at [facility].  Multiple views of the left shoulder were reviewed, including AP, axillary, outlet, and a.c. joint views.  These were reviewed with the patient, and used for medical decision making.  X-rays reveal no fractures, no deformities, and no dislocations. AC joint resection is noted.     Impression:   1. Left shoulder rotator cuff tendonitis versus recurrent full thickness rotator cuff tear  2. S/P left shoulder rotator cuff repair, decompression and AC joint resection with Dr. Mignon Alberts  3. Likely ruptured long head of the biceps, right shoulder  4. Status post right shoulder status post operative arthroscopy, subacromial decompression, distal clavicle excision, rotator cuff repair DOS July 09, 2015, Dr. Basil Lim  5. DM II - A1c 5.7    Medical decision making: We had a lengthy discussion with the patient today regarding his condition. I discussed with the patient their physical exam findings, radiographs and imaging, above diagnoses, and current treatment options along with their risks and benefits.     Again, her subjective complaints and objective findings are consistent with rotator cuff tendonitis of the left shoulder. We discussed the treatment options including anti-inflammatory medications, injections, physical therapy, bracing, and surgical interventions. At this time we will manage the patient?s symptoms conservatively. A corticosteroid injection was recommended to the patient. This injection is both diagnostic and therapeutic. The risks associated with the injection were discussed with the patient and they have elected to proceed.    If he fails to improvement with today's cortisone injection, we may consider an MRI arthrogram of the left shoulder to elucidate for a recurrent rotator cuff tear(s).     Regarding his ruptured long head of the biceps of the right shoulder, he was recommended to utilize a compression arm sleeve.     Large Joint/Bursa Drain/Inject: L subacromial bursa    Date/Time: 12/20/2023 9:00 AM    Performed by: Ara Knee., MD  Authorized by: Ara Knee., MD    Consent Given by:  Patient  Site marked: the procedure site was marked    Timeout: prior to procedure the correct patient, procedure, and site was verified    Indications:  Pain  Location:  Shoulder  Site:  L subacromial bursa  Approach:  Posterior  Guidance: ultrasound    Needle Size:  22 G  Medications:  80 mg triamcinolone acetonide 40 mg/mL; 40 mg mepivacaine 1 %; 4 mL lidocaine 1%   PROCEDURE,LEFT SUBACROMIAL JOINT INJECTION    Under strict sterile conditions 2 cc of Kenalog using 40 mg/cc mixture, 4 cc lidocaine, 4 cc of Polocaine was injected into the subacromial joint spaces using a superior approach.  He tolerated the procedures well.  A sterile dressing was applied.      The patient tolerated the procedure well. No complications were encountered during the procedure.     The patient was instructed to rest and Ice the shoulder this evening.     If any signs or symptoms of a reaction to the mediation or infection develop, the patient should call the office immediately.         The patient is instructed to use ice, heat, and OTC pain medications as necessary to control any pain or swelling.  If the patient notes any increased pain or swelling they should contact the office immediately. The patient may continue activities as tolerated.     All of the patient's questions and concerns were addressed.    Follow-up:  The patient will follow up in 4-6 weeks, or sooner if necessary.    Sincerely,       Sanjuan Crumbly MD  Sports Medicine  Orthopedic Surgery

## 2023-12-19 ENCOUNTER — Encounter: Payer: Self-pay | Admitting: Dermatology

## 2023-12-19 ENCOUNTER — Ambulatory Visit: Payer: PPO | Admitting: Dermatology

## 2023-12-19 DIAGNOSIS — W908XXA Exposure to other nonionizing radiation, initial encounter: Secondary | ICD-10-CM | POA: Diagnosis not present

## 2023-12-19 DIAGNOSIS — Z85828 Personal history of other malignant neoplasm of skin: Secondary | ICD-10-CM

## 2023-12-19 DIAGNOSIS — L820 Inflamed seborrheic keratosis: Secondary | ICD-10-CM

## 2023-12-19 DIAGNOSIS — Z79899 Other long term (current) drug therapy: Secondary | ICD-10-CM

## 2023-12-19 DIAGNOSIS — L578 Other skin changes due to chronic exposure to nonionizing radiation: Secondary | ICD-10-CM | POA: Diagnosis not present

## 2023-12-19 DIAGNOSIS — L821 Other seborrheic keratosis: Secondary | ICD-10-CM

## 2023-12-19 DIAGNOSIS — Z8589 Personal history of malignant neoplasm of other organs and systems: Secondary | ICD-10-CM

## 2023-12-19 DIAGNOSIS — D1801 Hemangioma of skin and subcutaneous tissue: Secondary | ICD-10-CM | POA: Diagnosis not present

## 2023-12-19 DIAGNOSIS — Z7189 Other specified counseling: Secondary | ICD-10-CM | POA: Diagnosis not present

## 2023-12-19 DIAGNOSIS — L409 Psoriasis, unspecified: Secondary | ICD-10-CM

## 2023-12-19 DIAGNOSIS — L570 Actinic keratosis: Secondary | ICD-10-CM

## 2023-12-19 DIAGNOSIS — L814 Other melanin hyperpigmentation: Secondary | ICD-10-CM

## 2023-12-19 DIAGNOSIS — Z1283 Encounter for screening for malignant neoplasm of skin: Secondary | ICD-10-CM

## 2023-12-19 DIAGNOSIS — D229 Melanocytic nevi, unspecified: Secondary | ICD-10-CM

## 2023-12-19 DIAGNOSIS — Z86018 Personal history of other benign neoplasm: Secondary | ICD-10-CM

## 2023-12-19 NOTE — Patient Instructions (Addendum)
 Cryotherapy Aftercare  Wash gently with soap and water everyday.   Apply Vaseline and Band-Aid daily until healed.    Start Otezla 30mg  1 pill a day, titrating up to 30mg .   Start out taking 10mg  1 pill a day for 12 days, then you will take 1 20mg  pill a day for 12 days, then you will take 1 30mg  pill until you come back for follow up.   Due to recent changes in healthcare laws, you may see results of your pathology and/or laboratory studies on MyChart before the doctors have had a chance to review them. We understand that in some cases there may be results that are confusing or concerning to you. Please understand that not all results are received at the same time and often the doctors may need to interpret multiple results in order to provide you with the best plan of care or course of treatment. Therefore, we ask that you please give [place] 2 business days to thoroughly review all your results before contacting the office for clarification. Should we see a critical lab result, you will be contacted sooner.   If You Need Anything After Your Visit  If you have any questions or concerns for your doctor, please call our main line at [phone] and press option 4 to reach your doctor's medical assistant. If no one answers, please leave a voicemail as directed and we will return your call as soon as possible. Messages left after 4 pm will be answered the following business day.   You may also send [place] a message via MyChart. We typically respond to MyChart messages within 1-2 business days.  For prescription refills, please ask your pharmacy to contact our office. Our fax number is [phone].  If you have an urgent issue when the clinic is closed that cannot wait until the next business day, you can page your doctor at the number below.    Please note that while we do our best to be available for urgent issues outside of office hours, we are not available 24/7.   If you have an urgent issue and  are unable to reach [place], you may choose to seek medical care at your doctor's office, retail clinic, urgent care center, or emergency room.  If you have a medical emergency, please immediately call 911 or go to the emergency department.  Pager Numbers  - Dr. Gwen Pounds: [phone]  - Dr. Roseanne Reno: [phone]  - Dr. Katrinka Blazing: [phone]   In the event of inclement weather, please call our main line at [phone] for an update on the status of any delays or closures.  Dermatology Medication Tips: Please keep the boxes that topical medications come in in order to help keep track of the instructions about where and how to use these. Pharmacies typically print the medication instructions only on the boxes and not directly on the medication tubes.   If your medication is too expensive, please contact our office at [phone] option 4 or send [place] a message through MyChart.   We are unable to tell what your co-pay for medications will be in advance as this is different depending on your insurance coverage. However, we may be able to find a substitute medication at lower cost or fill out paperwork to get insurance to cover a needed medication.   If a prior authorization is required to get your medication covered by your insurance company, please allow [place] 1-2 business days to complete this process.  Drug prices often vary  depending on where the prescription is filled and some pharmacies may offer cheaper prices.  The website www.goodrx.com contains coupons for medications through different pharmacies. The prices here do not account for what the cost may be with help from insurance (it may be cheaper with your insurance), but the website can give you the price if you did not use any insurance.  - You can print the associated coupon and take it with your prescription to the pharmacy.  - You may also stop by our office during regular business hours and pick up a GoodRx coupon card.  - If you need  your prescription sent electronically to a different pharmacy, notify our office through [facility] or by phone at [phone] option 4.     Si Usted Necesita Algo Despus de Su Visita  Tambin puede enviarnos un mensaje a travs de Clinical cytogeneticist. Por lo general respondemos a los mensajes de MyChart en el transcurso de 1 a 2 das hbiles.  Para renovar recetas, por favor pida a su farmacia que se ponga en contacto con nuestra oficina. Annie Sable de fax es [place] [phone].  Si tiene un asunto urgente cuando la clnica est cerrada y que no puede esperar hasta el siguiente da hbil, puede llamar/localizar a su doctor(a) al nmero que aparece a continuacin.   Por favor, tenga en cuenta que aunque hacemos todo lo posible para estar disponibles para asuntos urgentes fuera del horario de [place], no estamos disponibles las 24 horas del da, los 7 [address]  [address] de la [place].   Si tiene un problema urgente y no puede comunicarse con nosotros, puede optar por buscar atencin mdica  en el consultorio de su doctor(a), en una clnica privada, en un centro de atencin urgente o en una sala de emergencias.  Si tiene Engineer, drilling, por favor llame inmediatamente al 911 o vaya a la sala de emergencias.  Nmeros de bper  - Dr. Gwen Pounds: [phone]  - Dra. Roseanne Reno: [phone]  - Dr. Katrinka Blazing: [phone]   En caso de inclemencias del tiempo, por favor llame a Lacy Duverney principal al [phone] para una actualizacin sobre el [place] de cualquier retraso o cierre.  Consejos para la medicacin en dermatologa: Por favor, guarde las cajas en las que vienen los medicamentos de uso tpico para ayudarle a seguir las instrucciones sobre dnde y cmo usarlos. Las farmacias generalmente imprimen las instrucciones del medicamento slo en las cajas y no directamente en los tubos del [place].   Si su medicamento es muy caro, por favor, pngase en contacto con Rolm Gala llamando al  [phone] y presione la opcin 4 o envenos un mensaje a travs de Clinical cytogeneticist.   No podemos decirle cul ser su copago por los medicamentos por adelantado ya que esto es diferente dependiendo de la cobertura de su seguro. Sin embargo, es posible que podamos encontrar un medicamento sustituto a Audiological scientist un formulario para que el seguro cubra el medicamento que se considera necesario.   Si se requiere una autorizacin previa para que su compaa de seguros [place] su medicamento, por favor permtanos de 1 a 2 das hbiles para completar [address]  Los precios de los medicamentos varan con frecuencia dependiendo del Environmental consultant de dnde se surte la receta y alguna farmacias pueden ofrecer precios ms baratos.  El sitio web www.goodrx.com tiene cupones para medicamentos de Health and safety inspector. Los precios aqu no tienen en cuenta lo que podra costar con la ayuda del seguro (puede ser ms barato con su  seguro), pero el sitio web puede darle el precio si no Visual merchandiser.  - Puede imprimir el cupn correspondiente y llevarlo con su receta a la farmacia.  - Tambin puede pasar por nuestra oficina durante el horario de atencin regular y Education officer, museum una tarjeta de cupones de GoodRx.  - Si necesita que su receta se enve electrnicamente a una farmacia diferente, informe a nuestra oficina a travs de MyChart de [place] o por telfono llamando al [phone] y presione la opcin 4.

## 2023-12-19 NOTE — Progress Notes (Signed)
 Follow-Up Visit   Subjective  Ryan Blevins is a 73 y.o. male who presents for the following: Skin Cancer Screening and Upper Body Skin Exam hx of BCC, SCC, Dysplastic Nevus, Aks Sebopsoriasis/Psoriasis Halobetasol foam prn, sample of Zoryve sample, vtama sample  The patient presents for Upper Body Skin Exam (UBSE) for skin cancer screening and mole check. The patient has spots, moles and lesions to be evaluated, some may be new or changing and the patient may have concern these could be cancer.  The following portions of the chart were reviewed this encounter and updated as appropriate: medications, allergies, medical history  Review of Systems:  No other skin or systemic complaints except as noted in HPI or Assessment and Plan.  Objective  Well appearing patient in no apparent distress; mood and affect are within normal limits.  All skin waist up examined. Relevant physical exam findings are noted in the Assessment and Plan.  face, ears x 16 (16) Pink scaly macules face, arms, hands, chest x 21 (21) Stuck on waxy paps with erythema  Assessment & Plan   AK (ACTINIC KERATOSIS) (16) face, ears x 16 (16) Actinic keratoses are precancerous spots that appear secondary to cumulative UV radiation exposure/sun exposure over time. They are chronic with expected duration over 1 year. A portion of actinic keratoses will progress to squamous cell carcinoma of the skin. It is not possible to reliably predict which spots will progress to skin cancer and so treatment is recommended to prevent development of skin cancer.  Recommend daily broad spectrum sunscreen SPF 30+ to sun-exposed areas, reapply every 2 hours as needed.  Recommend staying in the shade or wearing long sleeves, sun glasses (UVA+UVB protection) and wide brim hats (4-inch brim around the entire circumference of the hat). Call for new or changing lesions. Destruction of lesion - face, ears x 16 (16) Complexity: simple    Destruction method: cryotherapy   Informed consent: discussed and consent obtained   Timeout:  patient name, date of birth, surgical site, and procedure verified Lesion destroyed using liquid nitrogen: Yes   Region frozen until ice ball extended beyond lesion: Yes   Outcome: patient tolerated procedure well with no complications   Post-procedure details: wound care instructions given   INFLAMED SEBORRHEIC KERATOSIS (21) face, arms, hands, chest x 21 (21) Symptomatic, irritating, patient would like treated. Destruction of lesion - face, arms, hands, chest x 21 (21) Complexity: simple   Destruction method: cryotherapy   Informed consent: discussed and consent obtained   Timeout:  patient name, date of birth, surgical site, and procedure verified Lesion destroyed using liquid nitrogen: Yes   Region frozen until ice ball extended beyond lesion: Yes   Outcome: patient tolerated procedure well with no complications   Post-procedure details: wound care instructions given   Skin cancer screening performed today.  Actinic Damage - Chronic condition, secondary to cumulative UV/sun exposure - diffuse scaly erythematous macules with underlying dyspigmentation - Recommend daily broad spectrum sunscreen SPF 30+ to sun-exposed areas, reapply every 2 hours as needed.  - Staying in the shade or wearing long sleeves, sun glasses (UVA+UVB protection) and wide brim hats (4-inch brim around the entire circumference of the hat) are also recommended for sun protection.  - Call for new or changing lesions.  Lentigines, Seborrheic Keratoses, Hemangiomas - Benign normal skin lesions - Benign-appearing - Call for any changes  Melanocytic Nevi - Tan-brown and/or pink-flesh-colored symmetric macules and papules - Benign appearing on exam today - Observation -  Call clinic for new or changing moles - Recommend daily use of broad spectrum spf 30+ sunscreen to sun-exposed areas.   HISTORY OF BASAL CELL  CARCINOMA OF THE SKIN - No evidence of recurrence today - Recommend regular full body skin exams - Recommend daily broad spectrum sunscreen SPF 30+ to sun-exposed areas, reapply every 2 hours as needed.  - Call if any new or changing lesions are noted between office visits  - L med pretibial, R sup helix, L medial upper eyelid, R ear middle helix  HISTORY OF SQUAMOUS CELL CARCINOMA OF THE SKIN - No evidence of recurrence today - No lymphadenopathy - Recommend regular full body skin exams - Recommend daily broad spectrum sunscreen SPF 30+ to sun-exposed areas, reapply every 2 hours as needed.  - Call if any new or changing lesions are noted between office visits - R ear at the sup antihelix, R hand dorsum   HISTORY OF DYSPLASTIC NEVUS No evidence of recurrence today Recommend regular full body skin exams Recommend daily broad spectrum sunscreen SPF 30+ to sun-exposed areas, reapply every 2 hours as needed.  Call if any new or changing lesions are noted between office visits  - L mid meial calf  PSORIASIS Scalp Labs from 10/12/23 viewed chemistries including liver and kidneys wnl 10%BSA Exam: scalp with thick areas and excoriations Chronic and persistent condition with duration or expected duration over one year. Condition is bothersome/symptomatic for patient. Currently flared. Counseling on psoriasis and coordination of care  psoriasis is a chronic non-curable, but treatable genetic/hereditary disease that may have other systemic features affecting other organ systems such as joints (Psoriatic Arthritis). It is associated with an increased risk of inflammatory bowel disease, heart disease, non-alcoholic fatty liver disease, and depression.  Treatments include light and laser treatments; topical medications; and systemic medications including oral and injectables.  Treatment Plan: Discussed [place] and s/e Start Otezla 30mg  1 po qd, pt will titrate up to 30mg  1 po qd, samples x 3 Lot  [id number] exp 04/18/25 Cont Halobetasol foam qd up to 5d/wk aa scalp prn flares, avoid f/g/a  Side effects of Otezla (apremilast) include diarrhea, nausea, headache, upper respiratory infection, depression, and weight decrease (5-10%). It should only be taken by pregnant women after a discussion regarding risks and benefits with their doctor. Goal is control of skin condition, not cure.  The use of [place] requires long term medication management, including periodic office visits.  Topical steroids (such as triamcinolone, fluocinolone, fluocinonide, mometasone, clobetasol, halobetasol, betamethasone, hydrocortisone) can cause thinning and lightening of the skin if they are used for too long in the same area. Your physician has selected the right strength medicine for your problem and area affected on the body. Please use your medication only as directed by your physician to prevent side effects.   Return for 4-12 wks for Psoriasis f/u.  I, Ardis Rowan, RMA, am acting as scribe for Armida Sans, MD .   Documentation: I have reviewed the above documentation for accuracy and completeness, and I agree with the above.  Armida Sans, MD

## 2023-12-20 ENCOUNTER — Ambulatory Visit: Payer: Commercial Managed Care - HMO

## 2023-12-20 DIAGNOSIS — M25512 Pain in left shoulder: Secondary | ICD-10-CM

## 2024-01-08 MED ADMIN — LIDOCAINE HCL 1 % IJ SOLN: INTRA_ARTICULAR | @ 14:00:00 | Stop: 2023-12-20

## 2024-01-08 MED ADMIN — MEPIVACAINE HCL 1 % IJ SOLN: INTRA_ARTICULAR | @ 14:00:00 | Stop: 2023-12-20

## 2024-01-08 MED ADMIN — TRIAMCINOLONE ACETONIDE 40 MG/ML IJ SUSP: INTRA_ARTICULAR | @ 14:00:00 | Stop: 2023-12-20

## 2024-01-11 ENCOUNTER — Ambulatory Visit: Payer: Commercial Managed Care - HMO

## 2024-01-11 DIAGNOSIS — M25512 Pain in left shoulder: Secondary | ICD-10-CM

## 2024-01-11 NOTE — Progress Notes
 Date: 01/11/2024  Name: Daryl Graves   MRN#: [id number]   DOB: 06/04/1951   Age: 73 y.o.      Lavonia Powers L. Elona Hal, MD  Patria Bookbinder PA-C      CHIEF COMPLAINT: Left shoulder pain    REFERRING PHYSICIAN: Elois Hair, MD    INTERIM HISTORY: The patient is a 73 y.o. year old Male who presents to the office for re-evaluation of his left shoulder. Please recall the patient denies any injury to the left shoulder, however, notes persistent pain for approximately several years. Patient reports undergoing a left shoulder rotator cuff repair with [facility] joint resection with Dr. Mignon Alberts in the 1990's. Most recently, he denotes pain down the anterolateral aspect of the left shoulder, and difficulty with sleeping on his left side. Please recall the patient has attempted and failed conservative management consisting of anti-inflammatory medications, multiple cortisone injections, bracing, home exercises, and formal physical therapy for the past 3 years without alleviation of his symptoms.    Since last visit, the patient reports great improvement in his symptoms. A corticosteroid injection was administered at last visit, on 12/20/2023. Patient notes 90% relief of their symptoms.  The patient continues to have increased discomfort with heavy lifting, pushing, pulling, and overhead activities. The patient is accompanied by his wife on today's visit.      REVIEW OF SYSTEMS: The 14-point review of systems as documented today in the medical record is remarkable for the positive orthopedic problems discussed above and their relevance was considered with respect to Constitutional, ENT, Cardiovascular, GU, Skin, Neurologic, Endocrine, Hematologic, Psychiatric, Gastrointestinal, Respiratory, Eyes and Allergic/Immunologic systems.    Physical Examination:  There were no vitals taken for this visit.    General: The patient is well-developed, well-nourished, and in no acute distress.  Body habitus is normal.  Patient is oriented ?3, to place, time and person.  Judgment, mood and affect are appropriate.  External exam of eyes, ears, nose and mouth reveal no deformities, scars or lesions.  Respirations are regular and unlabored.  Respiratory effort is normal with no evidence of abnormal intercostal retraction, or excessive use of accessory muscles.  Extraocular movements are intact, pupils are symmetric.  No conjunctivitis or icterus is present.  Skin appears to be normal without rashes, lesions, or ulcers.  Pulses regular.  No cyanosis, clubbing or edema is evident.  Patient has no movement disorder or loss of sensation except as described below.  Gait and station are within normal limits except as described below.  No amputations are fixed deformities are evident.    Left Shoulder:   Inspection: No swelling is noted.  No scars are noted.  No atrophy of surrounding muscle structures was appreciated.  There is no deformity over the A/C joint.    Palpation:   Pain with palpation   []  None [x]  Mild  []  Moderate    []  Severe   anterior aspect of the shoulder.     Range of motion exam:  Forward flexion 170?  Abduction 160?   []   with   []   without pain.    External rotation: 60    Internal rotation: T12      Crepitus is [x]  Present  []  Not present with shoulder range of motion.  Scapular motion is normal.      Manual muscle strength testing:   Supraspinatus (abduction in the scapular plane): 5/5    Pain: []  None []  Mild  [x]  Moderate    []  Severe  Infraspinatus (external rotation, arm at side): 5/5   Pain:  [x]  None []  Mild  []  Moderate    []  Severe   Subscapularis(resisted belly press):  5/5     Pain: [x]  None []  Mild  []  Moderate    []  Severe     Provocative Tests:   Hawkins Test:     [x]  Positive []  Negative []  Not Tested  Cross arm adduction test:  []  Positive [x]  Negative []  Not Tested  Speeds test:   [x]  Positive []  Negative []  Not Tested  O'Brien's test:  []  Positive [x]  Negative []  Not Tested  Apprehension/relocation:    []  Positive [x]  Negative []  Not Tested  Shoulder joint instability was not noted.  Sulcus sign was negative.  Drop arm is not present.    Neurologic: No gross motor or sensory deficits    Vascular: Distal pulses are intact    Skin: No rashes or lesions    X-rays: No new images taken today.Previous images reviewed.     Impression:   1. Left shoulder rotator cuff tendonitis versus recurrent full thickness rotator cuff tear  2. S/P left shoulder rotator cuff repair, decompression and AC joint resection with Dr. Mignon Alberts  3. Likely ruptured long head of the biceps, right shoulder  4. Status post right shoulder status post operative arthroscopy, subacromial decompression, distal clavicle excision, rotator cuff repair DOS July 09, 2015, Dr. Basil Lim  5. DM II - A1c 5.7    Medical decision making: We had a lengthy discussion with the patient today regarding his condition. I discussed with the patient their physical exam findings, radiographs and imaging, above diagnoses, and current treatment options along with their risks and benefits.     Again, her subjective complaints and objective findings are consistent with rotator cuff tendonitis of the left shoulder. We discussed the treatment options including anti-inflammatory medications, injections, physical therapy, bracing, and surgical interventions. Since last visit, the patient reports great improvement in his symptoms. A corticosteroid injection was administered at last visit, on 12/20/2023. Patient notes 90% relief of their symptoms.  The patient continues to have increased discomfort with heavy lifting, pushing, pulling, and overhead activities.  Considering improvement in patient's symptoms, we will continue to treat the patient conservatively. Our recommendation is the patient continue with a home exercise program for ROM and strengthening exercises.    If patient's symptoms exacerbate, we may consider an MRI arthrogram of the left shoulder to elucidate for a recurrent rotator cuff tear(s) vs repeat CSI.      The patient is instructed to use ice, heat, and OTC pain medications as necessary to control any pain or swelling.  If the patient notes any increased pain or swelling they should contact the office immediately. The patient may continue activities as tolerated.     All of the patient's questions and concerns were addressed.    Follow-up: The patient will follow up in 4-6 months, or sooner if necessary.    The patient was examined and evaluated by Vishnu Moeller PA-C, for Sanjuan Crumbly M.D. The evaluation and plan was reviewed and approved by Sanjuan Crumbly M.D        Lino Wickliff PA-C for  Sanjuan Crumbly M.D   Orthopedic Sports Medicine      CC: Elois Hair, MD

## 2024-01-18 ENCOUNTER — Encounter: Payer: Self-pay | Admitting: Family Medicine

## 2024-01-18 ENCOUNTER — Ambulatory Visit: Payer: PPO | Admitting: Family Medicine

## 2024-01-18 VITALS — BP 148/80 | HR 82 | Temp 98.7°F | Ht 71.5 in | Wt 207.0 lb

## 2024-01-18 DIAGNOSIS — R739 Hyperglycemia, unspecified: Secondary | ICD-10-CM | POA: Diagnosis not present

## 2024-01-18 DIAGNOSIS — M79673 Pain in unspecified foot: Secondary | ICD-10-CM

## 2024-01-18 LAB — POCT GLYCOSYLATED HEMOGLOBIN (HGB A1C): Hemoglobin A1C: 6 % — AB (ref 4.0–5.6)

## 2024-01-18 NOTE — Progress Notes (Unsigned)
 A1c 6, d/w pt. He had been working on diet, d/w pt.    Foot pain. H/o gout.  Colchicine  didn't help his pain. L distal MT pain.  No trauma.    He doesn't have contact with his daughter.   His wife has vision loss at baseline.  Discussed.  Support offered.  Meds, vitals, and allergies reviewed.   ROS: Per HPI unless specifically indicated in ROS section   [place] supple no LA rrr It looks like he has dropped L MT heads.  No bruising or ulceration.  Pulses intact locally.  He felt some better with a metatarsal pad.  Discussed use at office visit.

## 2024-01-18 NOTE — Patient Instructions (Signed)
 You are not diabetic.  Keep working on limiting processed carbs.   Try the metatarsal pad in the meantime.  Update me as needed.  Let me know if your BP is persistently above 140/90.  Take care.  Glad to see you.

## 2024-01-21 DIAGNOSIS — M79673 Pain in unspecified foot: Secondary | ICD-10-CM | POA: Insufficient documentation

## 2024-01-21 NOTE — Assessment & Plan Note (Signed)
 Likely from dropped metatarsal heads and discussed using a metatarsal pad.  He felt some better with using that at the office visit.  He can update me as needed.

## 2024-01-21 NOTE — Assessment & Plan Note (Signed)
 Not diabetic by A1c.  Discussed avoiding refined sugars.  He can update me as needed.

## 2024-01-24 NOTE — Progress Notes
 CHIEF COMPLAINT:   Chief Complaint   Patient presents with    Right Hand - Pain       REFERRING PHYSICIAN: None; Self-Referred    HISTORY OF PRESENT ILLNESS: Daryl Graves is a 73 y.o. right-hand-dominant male who  presents today for an orthopedic evaluation of their right hand. The patient complains of triggering at the right index finger. The patient also complains of locking at the left ring finger which he has to manually unlock. The patient also complains of numbness and tingling at the bilateral radial 4 digits. Symptoms began without specific trauma or injury approximately 6 months ago. His pain is sharp in quality. It is a 8/10 in severity and intermittent in frequency. Symptoms have been worsening over the passage of time. Symptoms are alleviated with rest and aggravated with use.  The patient reports limitations to opening water bottles and making a fist.     The patient denies numbness and tingling and neck pain except as noted above.     Prior treatment has included:   [x]   No previous treatment   []   Ice/Heat   []   NSAID's  []   Physical therapy   []   Cortisone injection   []   Bracing  []   Pain medication   []   Surgery    PAST MEDICAL HISTORY:   Past Medical History:   Diagnosis Date    Arthritis     Coccidioidomycosis     Diabetes mellitus (HCC/RAF)     Peripheral vascular disease        MEDICATIONS:   Current Outpatient Medications:     atorvastatin 10 mg tablet    azaTHIOprine 50 mg tablet    CELECOXIB 200 mg capsule    finasteride 5 mg tablet    LUMIGAN 0.01 % ophthalmic solution    mesalamine (LIALDA) 1.2 g DR tablet    tamsulosin 0.4 mg capsule    ALLERGIES:   Allergies   Allergen Reactions    Ibuprofen Other (See Comments)     Soars in mouth       PAST SURGICAL/HOSPITALIZATION HISTORY:   Past Surgical History:   Procedure Laterality Date    bilateral knee replacements      [id number]    bilateral thumb sx      left knee sx      left shoulder sx      [place]    right shoulder sx      with [facility]       SOCIAL HISTORY:   Social History     Tobacco Use    Smoking status: Never    Smokeless tobacco: Never   Substance Use Topics    Alcohol use: Not Currently       FAMILY HISTORY:   Family History       Relation Problem Comments    Mother (Deceased)        Father (Deceased)    Cancer                REVIEW OF SYSTEMS:        01/25/2024    11:01 AM   Review of Systems   Fevers No   Unusual fatigue No   Very sudden vision change No   Eye pain or irritation No   Runny nose No   Sore throat No   Chest pain No   Swelling of legs/ankles Yes   Cough No   Shortness of breath No   Nausea No  Vomiting No   Abdominal pain No   Diarrhea No   Constipation No   Genital lesions No   Unusual discharge No   Pain on urination No   Body aches Yes   Joint aches Yes   Skin lesions No   Rash No   Weakness No   Numbness Yes   Depression No   Anxiety No   Weight change No   Hair loss No   Unusually frequent urination No   Bleeding No   Swollen lymph nodes (glands) No   Allergies Yes   Unusually frequent infections No         VITALS:   Vitals:    01/26/24 1043   Weight: (!) 250 lb (113.4 kg)   Height: 6' 1'' (1.854 m)       BMI: Body mass index is 32.98 kg/m?Aaron Aas    PHYSICAL EXAMINATION:   GENERAL: No acute distress, pleasant  HEENT: Anicteric, normocephalic, normal extraocular movements  NECK: Normal range of motion  PSYCHIATRIC: Normal mood, normal affect  RESPIRATIONS: Normal, unlabored  SKIN: Normal color, tone, and turgor  NEUROLOGIC: Cranial nerves II-XII grossly intact  MUSCULOSKELETAL: Normal gross motion of lower extremities    EXTREMITY:     Right Hand    Thumb: []  Crepitus, []  A1 Tender, []  A1 Nodule, []  A1 Ganglion, []  Triggering, []  No Interphalangeal joint hyperextension, []  No Interphalangeal joint motion    Index finger: []  Crepitus, []  A1 Tender, []  A1 Nodule, []  A1 Ganglion, []  Triggering, []  EDC Subluxation, []  Dupuytren's Nodule, []  Extensor Lag PIP, Tip to DPC 0 cm    Middle finger: [x]  Crepitus, []  A1 Tender, []  A1 Nodule, []  A1 Ganglion, [x]  Triggering, []  EDC Subluxation, []  Dupuytren's Nodule, []  Extensor Lag PIP, Tip to DPC 0 cm    Ring finger: []  Crepitus, []  A1 Tender, []  A1 Nodule, []  A1 Ganglion, []  Triggering, []  EDC Subluxation, []  Dupuytren's Nodule, []  Extensor Lag PIP, Tip to DPC 0 cm    Small finger: []  Crepitus, []  A1 Tender, []  A1 Nodule, []  A1 Ganglion, []  Triggering, []  EDC Subluxation, []  Dupuytren's Nodule, []  Extensor Lag PIP, Tip to [facility] 0 cm     Right carpal tunnel exam     []  Tinel's  [x]  Median nerve compression test to the radial 4 digits  [x]  Phalen's  []  Thenar atrophy     Right cubital tunnel exam     []  Tinel's  []  Ulnar nerve subluxation  [x]  Ulnar nerve compression test to the thumb   [x]  Elbow flexion test  [x]  Interosseous atrophy  []  Weakness interosseous muscle  []  Wartenberg's sign  []  Froment sign  []  Hypothenar atrophy  [x]  Normal interosseous function with index finger adduction and middle finger abduction  []  Claw hand deformity     Left Hand    Thumb: []  Crepitus, []  A1 Tender, []  A1 Nodule, []  A1 Ganglion, []  Triggering, []  No Interphalangeal joint hyperextension, []  No Interphalangeal joint motion    Index finger: []  Crepitus, []  A1 Tender, []  A1 Nodule, []  A1 Ganglion, []  Triggering, []  EDC Subluxation, []  Dupuytren's Nodule, []  Extensor Lag PIP, Tip to DPC 0 cm    Middle finger: []  Crepitus, []  A1 Tender, []  A1 Nodule, []  A1 Ganglion, []  Triggering, []  EDC Subluxation, []  Dupuytren's Nodule, []  Extensor Lag PIP, Tip to DPC 0 cm    Ring finger: [x]  Crepitus, []  A1 Tender, []  A1 Nodule, []  A1 Ganglion, []  Triggering, []  EDC Subluxation, []   Dupuytren's Nodule, []  Extensor Lag PIP, Tip to DPC 0 cm    Small finger: []  Crepitus, []  A1 Tender, []  A1 Nodule, []  A1 Ganglion, []  Triggering, []  EDC Subluxation, []  Dupuytren's Nodule, []  Extensor Lag PIP, Tip to [facility] 0 cm     Left carpal tunnel exam     []  Tinel's  [x]  Median nerve compression test to the entire hand  [x]  Phalen's  []  Thenar atrophy    Left cubital tunnel exam     []  Tinel's  []  Ulnar nerve subluxation  [x]  Ulnar nerve compression test to the thumb  [x]  Elbow flexion test  []  Interosseous atrophy  []  Weakness interosseous muscle  []  Wartenberg's sign  []  Froment sign  []  Hypothenar atrophy  [x]  Normal interosseous function with index finger adduction and middle finger abduction  []  Claw hand deformity     DIAGNOSTIC STUDIES:     AP oblique lateral  view x-rays of the right hand ordered on 01/26/2024 in the office were reviewed with the patient. X-rays reveal     [x]  No acute fractures or dislocations  [x]  Degenerative joint disease trapezial metacarpal joint  [x]  Joint space narrowing trapezial metacarpal joint  [x]  Osteophyte trapezial metacarpal joint  []  Lateral subluxation of the first ray  []  Degenerative joint disease scaphoid trapezial trapezoid joint  []  Degenerative joint disease thumb metacarpophalangeal joint  []  Degenerative joint disease thumb interphalangeal joint    []  Degenerative joint disease metacarpophalangeal joint  [x]  Degenerative joint disease proximal interphalangeal joint  [x]  Degenerative joint disease distal interphalangeal joint  []  Osteophyte     []  Degenerative joint disease radiocarpal joint  []  Degenerative joint disease distal radioulnar joint  []  Ulnar neutral variance  []  Ulnar positive variance  []  Ulnar negative variance    AP oblique lateral  view x-rays of the left hand ordered on 01/26/2024 in the office were reviewed with the patient. X-rays reveal     [x]  No acute fractures or dislocations  [x]  Degenerative joint disease trapezial metacarpal joint  [x]  Joint space narrowing trapezial metacarpal joint  [x]  Osteophyte trapezial metacarpal joint  []  Lateral subluxation of the first ray  []  Degenerative joint disease scaphoid trapezial trapezoid joint  []  Degenerative joint disease thumb metacarpophalangeal joint  []  Degenerative joint disease thumb interphalangeal joint    []  Degenerative joint disease metacarpophalangeal joint  []  Degenerative joint disease proximal interphalangeal joint  []  Degenerative joint disease distal interphalangeal joint  []  Osteophyte     []  Ulnar neutral variance  []  Ulnar positive variance  []  Ulnar negative variance        IMPRESSION:   Right  middle finger  stenosing tenosynovitis   Left  ring finger  stenosing tenosynovitis   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 Conservative treatment includes splint immobilization, oral nonsteroidal anti-inflammatory medications and cortisone injection. The patient understands that cortisone injection has an approximately 50% success rate with the first injection, 25% success rate with the second injection and a 3-5% success rate with the third injection. I discussed that cortisone injection in patients with symptoms of 1 month has an 88% success rate and if symptom duration approach 1 year, success rate drops to 15%. Pain can be treated with  cortisone but may not help the triggering in that setting and surgery may be a better option. Risks of cortisone injection including hypopigmentation, hyperpigmentation, subcutaneous fat atrophy which are usually temporary but may be permanent, tendon rupture and transient hyperglycemia lasting up to five days were all discussed.     I discussed surgical treatment of trigger finger having a success rate approaching 99%. The patient understands that recurrent or persistent triggering may be due to another site such as the A3 pulley and may require additional surgery. No guarantees have been given.     I discussed with the patient the options for treatment of carpal tunnel syndrome including no treatment, conservative treatment and surgery as the last resort. Conservative treatment includes splint immobilization with the wrist in neutral and cortisone injection with an approximately 20-50% success rate lasting 6 months to one year. Risks of cortisone injection including hypopigmentation, hyperpigmentation, subcutaneous fat atrophy are usually temporary but may be permanent, tendon rupture and transient hyperglycemia lasting up to five days were all discussed. I discussed surgical success rate of approximately 90-95% and a 5-10% chance of persistent or recurrent carpal tunnel syndrome was discussed. We discussed endoscopic carpal tunnel release and open carpal tunnel release. No guarantees have been given.     I discussed with the patient the options for treatment of thumb CMC arthritis including no treatment, conservative treatment and surgery as the last resort. Conservative treatment includes splint immobilization such as a thumb spica splint or CMC controller splint, oral nonsteroidal anti-inflammatory medications and cortisone injection. I discussed surgical treatment options of thumb carpometacarpal arthritis which includes trapeziectomy and ligament reconstruction with tendon and tendon interposition versus thumb CMC denervation. Volar capsulodesis is performed to prevent hyperextension deformity at this joint which may reduce the risk of recurrent symptoms and deformity at the [facility] joint. We discussed pain relief in the majority of patients approximately 90% success rate but persistent pain may occur in a small percentage of patients.     At this time, I recommend initial conservative management with a cortisone injection for the trigger fingers.    The patient had prolonged hyperglycemia for 3 weeks after a cortisone injection to the shoulder. The patient is advised to consult with his PCP for medical control of his sugar level.    Prior to formal treatment, I recommend the patient obtain a nerve conduction study EMG of the bilateral upper extremity to evaluate for nerve compression.       The patient was recommended to use a Modabber Splint and Cubital Comfort Brace at night time. As an alternative to the Cubital Comfort splint, he may consider wrapping a towel around the elbow at night to keep the elbow in an extended position and prevent ulnar nerve compression.     We will hold off on cortisone injection until after the nerve conduction study EMG.     FOLLOW UP: After  nerve  conduction  test  EMG     I personally performed the services described in this documentation.  All medical record entries made by the scribe were at my direction and in my presence.  I have reviewed the chart and agreed that the record is complete and accurate.        Janeice Medal, MD  Hand Surgery    CC: Elois Hair, MD Ref, Laurin Popp MD, MD

## 2024-01-26 ENCOUNTER — Ambulatory Visit: Payer: Commercial Managed Care - HMO | Attending: Plastic and Reconstructive Surgery

## 2024-01-26 ENCOUNTER — Ambulatory Visit: Payer: Commercial Managed Care - HMO

## 2024-01-26 DIAGNOSIS — M65342 Trigger finger, left ring finger: Secondary | ICD-10-CM

## 2024-01-26 DIAGNOSIS — G5602 Carpal tunnel syndrome, left upper limb: Secondary | ICD-10-CM

## 2024-01-26 DIAGNOSIS — M65321 Trigger finger, right index finger: Secondary | ICD-10-CM

## 2024-01-26 DIAGNOSIS — M79641 Pain in right hand: Secondary | ICD-10-CM

## 2024-01-26 DIAGNOSIS — M79642 Pain in left hand: Secondary | ICD-10-CM

## 2024-01-26 DIAGNOSIS — G5601 Carpal tunnel syndrome, right upper limb: Secondary | ICD-10-CM

## 2024-03-04 DIAGNOSIS — H903 Sensorineural hearing loss, bilateral: Secondary | ICD-10-CM | POA: Diagnosis not present

## 2024-03-05 ENCOUNTER — Encounter: Payer: Self-pay | Admitting: Dermatology

## 2024-03-05 ENCOUNTER — Ambulatory Visit: Admitting: Dermatology

## 2024-03-05 DIAGNOSIS — L820 Inflamed seborrheic keratosis: Secondary | ICD-10-CM | POA: Diagnosis not present

## 2024-03-05 DIAGNOSIS — L570 Actinic keratosis: Secondary | ICD-10-CM | POA: Diagnosis not present

## 2024-03-05 DIAGNOSIS — L578 Other skin changes due to chronic exposure to nonionizing radiation: Secondary | ICD-10-CM

## 2024-03-05 DIAGNOSIS — W908XXA Exposure to other nonionizing radiation, initial encounter: Secondary | ICD-10-CM | POA: Diagnosis not present

## 2024-03-05 DIAGNOSIS — Z79899 Other long term (current) drug therapy: Secondary | ICD-10-CM | POA: Diagnosis not present

## 2024-03-05 DIAGNOSIS — L409 Psoriasis, unspecified: Secondary | ICD-10-CM | POA: Diagnosis not present

## 2024-03-05 DIAGNOSIS — Z7189 Other specified counseling: Secondary | ICD-10-CM

## 2024-03-05 MED ORDER — OTEZLA 30 MG PO TABS: 30.0000 mg | ORAL_TABLET | Freq: Two times a day (BID) | ORAL | 6 refills | Status: DC

## 2024-03-05 NOTE — Progress Notes (Signed)
 Follow-Up Visit   Subjective  Ryan Blevins is a 73 y.o. male who presents for the following: 4 - 12 week psoriasis follow up on scalp, patient states he is doing very well on Otezla, denied side effected like headache, depression, weight loss and upset stomach. Patient states itching at scalp has improved.   Patient also reports some spots on face and arms he would like checked.   The patient has spots, moles and lesions to be evaluated, some may be new or changing and the patient may have concern these could be cancer.  The following portions of the chart were reviewed this encounter and updated as appropriate: medications, allergies, medical history  Review of Systems:  No other skin or systemic complaints except as noted in HPI or Assessment and Plan.  Objective  Well appearing patient in no apparent distress; mood and affect are within normal limits.  A focused examination was performed of the following areas: Face, scalp, arms   Relevant exam findings are noted in the Assessment and Plan.  scalp and arms x 16 left mandible and neck x 5 (21) Erythematous stuck-on, waxy papule or plaque right forehead x 1, right temple x 1, right mandible x 1 (3) Erythematous thin papules/macules with gritty scale.   Assessment & Plan   PSORIASIS Scalp  Much improved on oral Otezla with decreased itch and scale.  Pt pleased.  denied side effected like headache, depression, weight loss and upset stomach. Exam:  Today some fine scale at scalp but itching has improved 10 % BSA but improved on Otezla  Chronic and persistent condition with duration or expected duration over one year. Condition is improving with Otezla  treatment but not currently at goal. Counseling on psoriasis and coordination of care  psoriasis is a chronic non-curable, but treatable genetic/hereditary disease that may have other systemic features affecting other organ systems such as joints (Psoriatic Arthritis). It is  associated with an increased risk of inflammatory bowel disease, heart disease, non-alcoholic fatty liver disease, and depression.  Treatments include light and laser treatments; topical medications; and systemic medications including oral and injectables.   Treatment Plan: Started Otezla 12/19/2023  Improved with no side effects on Otezla, will continue treatment  Rx of Otezla 30 mg bid qd sent to [facility] Given sample today  [facility] [id number] Lot: [id number] Exp 07/19/2025  Cont Halobetasol  foam qd up to 5d/wk aa scalp prn flares, avoid f/g/a   Side effects of Otezla (apremilast) include diarrhea, nausea, headache, upper respiratory infection, depression, and weight decrease (5-10%). It should only be taken by pregnant women after a discussion regarding risks and benefits with their doctor. Goal is control of skin condition, not cure.  The use of Otezla requires long term medication management, including periodic office visits.   Topical steroids (such as triamcinolone, fluocinolone, fluocinonide, mometasone, clobetasol , halobetasol , betamethasone, hydrocortisone) can cause thinning and lightening of the skin if they are used for too long in the same area. Your physician has selected the right strength medicine for your problem and area affected on the body. Please use your medication only as directed by your physician to prevent side effects  INFLAMED SEBORRHEIC KERATOSIS (21) scalp and arms x 16 left mandible and neck x 5 (21) Symptomatic, irritating, patient would like treated. Destruction of lesion - scalp and arms x 16 left mandible and neck x 5 (21) Complexity: simple   Destruction method: cryotherapy   Informed consent: discussed and consent obtained   Timeout:  patient  name, date of birth, surgical site, and procedure verified Lesion destroyed using liquid nitrogen: Yes   Region frozen until ice ball extended beyond lesion: Yes   Outcome: patient tolerated procedure well with no  complications   Post-procedure details: wound care instructions given   ACTINIC KERATOSIS (3) right forehead x 1, right temple x 1, right mandible x 1 (3) Actinic keratoses are precancerous spots that appear secondary to cumulative UV radiation exposure/sun exposure over time. They are chronic with expected duration over 1 year. A portion of actinic keratoses will progress to squamous cell carcinoma of the skin. It is not possible to reliably predict which spots will progress to skin cancer and so treatment is recommended to prevent development of skin cancer.  Recommend daily broad spectrum sunscreen SPF 30+ to sun-exposed areas, reapply every 2 hours as needed.  Recommend staying in the shade or wearing long sleeves, sun glasses (UVA+UVB protection) and wide brim hats (4-inch brim around the entire circumference of the hat). Call for new or changing lesions. Destruction of lesion - right forehead x 1, right temple x 1, right mandible x 1 (3) Complexity: simple   Destruction method: cryotherapy   Informed consent: discussed and consent obtained   Timeout:  patient name, date of birth, surgical site, and procedure verified Lesion destroyed using liquid nitrogen: Yes   Region frozen until ice ball extended beyond lesion: Yes   Outcome: patient tolerated procedure well with no complications   Post-procedure details: wound care instructions given   PSORIASIS   Related Medications Apremilast (OTEZLA) 30 MG TABS Take 1 tablet (30 mg total) by mouth 2 (two) times daily. ACTINIC SKIN DAMAGE   ACTINIC DAMAGE - chronic, secondary to cumulative UV radiation exposure/sun exposure over time - diffuse scaly erythematous macules with underlying dyspigmentation - Recommend daily broad spectrum sunscreen SPF 30+ to sun-exposed areas, reapply every 2 hours as needed.  - Recommend staying in the shade or wearing long sleeves, sun glasses (UVA+UVB protection) and wide brim hats (4-inch brim around the  entire circumference of the hat). - Call for new or changing lesions.  Return for jan - feb 2026 psoriasis follow up.  IRandee Busing, CMA, am acting as scribe for Celine Collard, MD.   Documentation: I have reviewed the above documentation for accuracy and completeness, and I agree with the above.  Celine Collard, MD

## 2024-03-05 NOTE — Patient Instructions (Addendum)
 Side effects of Otezla (apremilast) include diarrhea, nausea, headache, upper respiratory infection, depression, and weight decrease (5-10%). It should only be taken by pregnant women after a discussion regarding risks and benefits with their doctor. Goal is control of skin condition, not cure.  The use of [place] requires long term medication management, including periodic office visits.  Due to recent changes in healthcare laws, you may see results of your pathology and/or laboratory studies on MyChart before the doctors have had a chance to review them. We understand that in some cases there may be results that are confusing or concerning to you. Please understand that not all results are received at the same time and often the doctors may need to interpret multiple results in order to provide you with the best plan of care or course of treatment. Therefore, we ask that you please give [place] 2 business days to thoroughly review all your results before contacting the office for clarification. Should we see a critical lab result, you will be contacted sooner.   If You Need Anything After Your Visit  If you have any questions or concerns for your doctor, please call our main line at [phone] and press option 4 to reach your doctor's medical assistant. If no one answers, please leave a voicemail as directed and we will return your call as soon as possible. Messages left after 4 pm will be answered the following business day.   You may also send [place] a message via MyChart. We typically respond to MyChart messages within 1-2 business days.  For prescription refills, please ask your pharmacy to contact our office. Our fax number is [phone].  If you have an urgent issue when the clinic is closed that cannot wait until the next business day, you can page your doctor at the number below.    Please note that while we do our best to be available for urgent issues outside of office hours, we are not  available 24/7.   If you have an urgent issue and are unable to reach [place], you may choose to seek medical care at your doctor's office, retail clinic, urgent care center, or emergency room.  If you have a medical emergency, please immediately call 911 or go to the emergency department.  Pager Numbers  - Dr. Gwen Pounds: [phone]  - Dr. Roseanne Reno: [phone]  - Dr. Katrinka Blazing: [phone]   In the event of inclement weather, please call our main line at [phone] for an update on the status of any delays or closures.  Dermatology Medication Tips: Please keep the boxes that topical medications come in in order to help keep track of the instructions about where and how to use these. Pharmacies typically print the medication instructions only on the boxes and not directly on the medication tubes.   If your medication is too expensive, please contact our office at [phone] option 4 or send [place] a message through MyChart.   We are unable to tell what your co-pay for medications will be in advance as this is different depending on your insurance coverage. However, we may be able to find a substitute medication at lower cost or fill out paperwork to get insurance to cover a needed medication.   If a prior authorization is required to get your medication covered by your insurance company, please allow [place] 1-2 business days to complete this process.  Drug prices often vary depending on where the prescription is filled and some pharmacies may offer cheaper prices.  The  website www.goodrx.com contains coupons for medications through different pharmacies. The prices here do not account for what the cost may be with help from insurance (it may be cheaper with your insurance), but the website can give you the price if you did not use any insurance.  - You can print the associated coupon and take it with your prescription to the pharmacy.  - You may also stop by our office during regular business hours  and pick up a GoodRx coupon card.  - If you need your prescription sent electronically to a different pharmacy, notify our office through [facility] or by phone at [phone] option 4.     Si Usted Necesita Algo Despus de Su Visita  Tambin puede enviarnos un mensaje a travs de Clinical cytogeneticist. Por lo general respondemos a los mensajes de MyChart en el transcurso de 1 a 2 das hbiles.  Para renovar recetas, por favor pida a su farmacia que se ponga en contacto con nuestra oficina. Annie Sable de fax es [place] [phone].  Si tiene un asunto urgente cuando la clnica est cerrada y que no puede esperar hasta el siguiente da hbil, puede llamar/localizar a su doctor(a) al nmero que aparece a continuacin.   Por favor, tenga en cuenta que aunque hacemos todo lo posible para estar disponibles para asuntos urgentes fuera del horario de [place], no estamos disponibles las 24 horas del da, los 7 [address]  [address] de la [place].   Si tiene un problema urgente y no puede comunicarse con nosotros, puede optar por buscar atencin mdica  en el consultorio de su doctor(a), en una clnica privada, en un centro de atencin urgente o en una sala de emergencias.  Si tiene Engineer, drilling, por favor llame inmediatamente al 911 o vaya a la sala de emergencias.  Nmeros de bper  - Dr. Gwen Pounds: [phone]  - Dra. Roseanne Reno: [phone]  - Dr. Katrinka Blazing: [phone]   En caso de inclemencias del tiempo, por favor llame a Lacy Duverney principal al [phone] para una actualizacin sobre el [place] de cualquier retraso o cierre.  Consejos para la medicacin en dermatologa: Por favor, guarde las cajas en las que vienen los medicamentos de uso tpico para ayudarle a seguir las instrucciones sobre dnde y cmo usarlos. Las farmacias generalmente imprimen las instrucciones del medicamento slo en las cajas y no directamente en los tubos del [place].   Si su medicamento es muy caro, por favor, pngase  en contacto con Rolm Gala llamando al [phone] y presione la opcin 4 o envenos un mensaje a travs de Clinical cytogeneticist.   No podemos decirle cul ser su copago por los medicamentos por adelantado ya que esto es diferente dependiendo de la cobertura de su seguro. Sin embargo, es posible que podamos encontrar un medicamento sustituto a Audiological scientist un formulario para que el seguro cubra el medicamento que se considera necesario.   Si se requiere una autorizacin previa para que su compaa de seguros [place] su medicamento, por favor permtanos de 1 a 2 das hbiles para completar [address]  Los precios de los medicamentos varan con frecuencia dependiendo del Environmental consultant de dnde se surte la receta y alguna farmacias pueden ofrecer precios ms baratos.  El sitio web www.goodrx.com tiene cupones para medicamentos de Health and safety inspector. Los precios aqu no tienen en cuenta lo que podra costar con la ayuda del seguro (puede ser ms barato con su seguro), pero el sitio web puede darle el precio si no utiliz Tourist information centre manager.  -  Puede imprimir el cupn correspondiente y llevarlo con su receta a la farmacia.  - Tambin puede pasar por nuestra oficina durante el horario de atencin regular y Education officer, museum una tarjeta de cupones de GoodRx.  - Si necesita que su receta se enve electrnicamente a una farmacia diferente, informe a nuestra oficina a travs de MyChart de [place] o por telfono llamando al [phone] y presione la opcin 4.

## 2024-03-12 ENCOUNTER — Ambulatory Visit: Admitting: Podiatry

## 2024-03-12 ENCOUNTER — Ambulatory Visit (INDEPENDENT_AMBULATORY_CARE_PROVIDER_SITE_OTHER)

## 2024-03-12 ENCOUNTER — Encounter: Payer: Self-pay | Admitting: Podiatry

## 2024-03-12 DIAGNOSIS — M7751 Other enthesopathy of right foot: Secondary | ICD-10-CM

## 2024-03-12 DIAGNOSIS — M7752 Other enthesopathy of left foot: Secondary | ICD-10-CM | POA: Diagnosis not present

## 2024-03-12 NOTE — Progress Notes (Signed)
 Subjective:  Patient ID: Ryan Blevins, male    DOB: 1951-04-04,  MRN: [id number] HPI Chief Complaint  Patient presents with   Foot Pain    Plantar forefoot left and 1st MPJ right - history of gout, used to be an active runner and feels like he has messed up his feet throughout the years, toenail damage   New Patient (Initial Visit)    73 y.o. male presents with the above complaint.   ROS: Denies fever chills nausea mobic muscle aches pains calf pain back pain chest pain shortness of breath.  Past Medical History:  Diagnosis Date   Actinic keratosis 04/18/2018   Left nasal ala. Hypertrophic   Basal cell carcinoma 02/25/2010   Left med. pretibial. Superficial with focal infiltration.   Basal cell carcinoma 04/29/2013   Right superior helix. Excised: 06/12/2013   Basal cell carcinoma 11/09/2020   left medial upper eyelid - MOHs   Basal cell carcinoma 06/08/2023   right ear middle helix, EDC   Depression    Dysplastic nevus 08/19/2015   Left med medial calf. DN with severe atypia, lateral margin involved. Excised: 09/01/2015, margins free.   Hemorrhoid    Hyperlipidemia    Hypertension    Hyperuricemia    Low testosterone 2011   Prostate cancer (HCC) 2011   per Dr. Alline.  Seed implant per Dr. Jason   Skin cancer    Squamous cell carcinoma of skin 11/24/2021   Right Ear at the superior antihelix - tx with ED&C   Squamous cell carcinoma of skin 01/24/2022   right hand dorsum, EDC   Syncope    Wears hearing aid in both ears    Past Surgical History:  Procedure Laterality Date   CATARACT EXTRACTION W/PHACO Left 03/28/2022   Procedure: CATARACT EXTRACTION PHACO AND INTRAOCULAR LENS PLACEMENT (IOC) LEFT eyhance toric lens 4.89 00:43.1;  Surgeon: Myrna Adine Anes, MD;  Location: [facility];  Service: Ophthalmology;  Laterality: Left;   CATARACT EXTRACTION W/PHACO Right 07/11/2022   Procedure: CATARACT EXTRACTION PHACO AND INTRAOCULAR LENS PLACEMENT (IOC)  RIGHT;  Surgeon: Myrna Adine Anes, MD;  [facility];  Service: Ophthalmology;  Laterality: Right;  6.78 0:52.8   COLONOSCOPY  02/20/2009   Dr Dessa   COLONOSCOPY WITH PROPOFOL  N/A 10/24/2018   Procedure: COLONOSCOPY WITH PROPOFOL ;  Surgeon: Dessa Reyes ORN, MD;  Location: [facility];  Service: Endoscopy;  Laterality: N/A;   COLONOSCOPY WITH PROPOFOL  N/A 11/10/2021   Procedure: COLONOSCOPY WITH PROPOFOL ;  Surgeon: Dessa Reyes ORN, MD;  Location: [facility] END[facility]COPY;  Service: Endoscopy;  Laterality: N/A;    Current Outpatient Medications:    Apremilast (OTEZLA) 30 MG TABS, Take 1 tablet (30 mg total) by mouth 2 (two) times daily., Disp: 60 tablet, Rfl: 6   ASPIRIN 81 PO, Take by mouth daily., Disp: , Rfl:    cetirizine (ZYRTEC) 10 MG tablet, Take 10 mg by mouth daily., Disp: , Rfl:    Clobetasol  Propionate 0.05 % shampoo, Apply 0.5 % topically., Disp: , Rfl:    colchicine  0.6 MG tablet, Take 1 tablet (0.6 mg total) by mouth daily as needed., Disp: 30 tablet, Rfl: 1   fluticasone  (FLONASE ) 50 MCG/ACT nasal spray, SHAKE LIQUID AND USE 2 SPRAYS IN EACH NOSTRIL DAILY, Disp: 48 g, Rfl: 3   Halobetasol  Propionate 0.05 % FOAM, Apply to aa's scalp QD PRN. Avoid applying to face, groin, and axilla. Use as directed. Long-term use can cause thinning of the skin., Disp: 50 g, Rfl:  3   ketoconazole  (NIZORAL ) 2 % shampoo, Apply 1 Application topically 3 (three) times a week. Wash scalp 3 times weekly, let sit for 5 minutes and rinse out, Disp: 120 mL, Rfl: 6   lisinopril  (ZESTRIL ) 30 MG tablet, Take 1 tablet (30 mg total) by mouth daily., Disp: 90 tablet, Rfl: 3   Roflumilast  (ZORYVE ) 0.3 % CREA, Apply 1 Application topically as directed. Apply to aa scalp 3 days a week at bedtime Tuesday, Thursday and Saturday, Disp: 60 g, Rfl: 5   Roflumilast , Antiseborrheic, (ZORYVE ) 0.3 % FOAM, Apply 1 Application topically at bedtime., Disp: 60 g, Rfl: 5   rosuvastatin  (CRESTOR ) 10 MG tablet, TAKE  ONE TABLET BY MOUTH DAILY, Disp: 90 tablet, Rfl: 3   sildenafil  (REVATIO ) 20 MG tablet, Take 4-5 tablets (80-100 mg total) by mouth daily as needed., Disp: 50 tablet, Rfl: 12   tamsulosin  (FLOMAX ) 0.4 MG CAPS capsule, TAKE 1 CAPSULE BY MOUTH DAILY, Disp: 90 capsule, Rfl: 3   Tapinarof  (VTAMA ) 1 % CREA, Apply to aa's psoriasis QD PRN., Disp: 60 g, Rfl: 5   venlafaxine  XR (EFFEXOR -XR) 75 MG 24 hr capsule, Take 2 capsules (150 mg total) by mouth daily., Disp: 180 capsule, Rfl: 3  Allergies  Allergen Reactions   Atorvastatin     REACTION: but tolerant of crestor - this is the only statin he tolerates   Bupropion Hcl     REACTION: mood changes   Codeine Nausea And Vomiting   Review of Systems Objective:  There were no vitals filed for this visit.  General: Well developed, nourished, in no acute distress, alert and oriented x3   Dermatological: Skin is warm, dry and supple bilateral. Nails x 10 are well maintained; remaining integument appears unremarkable at this time. There are no open sores, no preulcerative lesions, no rash or signs of infection present.  Vascular: Dorsalis Pedis artery and Posterior Tibial artery pedal pulses are 2/4 bilateral with immedate capillary fill time. Pedal hair growth present. No varicosities and no lower extremity edema present bilateral.   Neruologic: Grossly intact via light touch bilateral. Vibratory intact via tuning fork bilateral. Protective threshold with Semmes Wienstein monofilament intact to all pedal sites bilateral. Patellar and Achilles deep tendon reflexes 2+ bilateral. No Babinski or clonus noted bilateral.   Musculoskeletal: No gross boney pedal deformities bilateral. No pain, crepitus, or limitation noted with foot and ankle range of motion bilateral. Muscular strength 5/5 in all groups tested bilateral.  Gait: Unassisted, Nonantalgic.    Radiographs:   Radiographs taken today demonstrate osseously mature individual with good bone  mineralization.  Rectus foot type.  Mild hallux abductovalgus deformity of the right foot.  Bipartite or fractured sesamoid tibial border right.  There is also some cystic changes along the medial aspect of the hypertrophic medial condyle of the first metatarsals bilaterally consistent with Martell sign from gout infections.  Assessment & Plan:   Assessment: Metatarsalgia osteoarthritis gouty arthritis first metatarsal phalangeal joint bilateral metatarsalgia lesser metatarsals.  Plan: Recommended orthotics.     Jeshua Ransford T. McDonald, [place]

## 2024-04-02 ENCOUNTER — Ambulatory Visit: Admitting: Dermatology

## 2024-04-02 ENCOUNTER — Telehealth: Payer: Self-pay

## 2024-04-02 ENCOUNTER — Encounter: Payer: Self-pay | Admitting: Dermatology

## 2024-04-02 DIAGNOSIS — L409 Psoriasis, unspecified: Secondary | ICD-10-CM | POA: Diagnosis not present

## 2024-04-02 DIAGNOSIS — L0291 Cutaneous abscess, unspecified: Secondary | ICD-10-CM

## 2024-04-02 DIAGNOSIS — L03011 Cellulitis of right finger: Secondary | ICD-10-CM

## 2024-04-02 DIAGNOSIS — Z79899 Other long term (current) drug therapy: Secondary | ICD-10-CM

## 2024-04-02 DIAGNOSIS — Z7189 Other specified counseling: Secondary | ICD-10-CM

## 2024-04-02 DIAGNOSIS — L02511 Cutaneous abscess of right hand: Secondary | ICD-10-CM

## 2024-04-02 MED ORDER — DOXYCYCLINE MONOHYDRATE 100 MG PO CAPS
100.0000 mg | ORAL_CAPSULE | Freq: Two times a day (BID) | ORAL | 0 refills | Status: AC
Start: 2024-04-02 — End: 2024-04-12

## 2024-04-02 MED ORDER — MUPIROCIN 2 % EX OINT: 1.0000 | TOPICAL_OINTMENT | Freq: Three times a day (TID) | CUTANEOUS | 1 refills | Status: AC

## 2024-04-02 NOTE — Telephone Encounter (Signed)
 Advised pt that Dr. Hester wanted him to start Mupirocin  ointment tid to R index finger and R 5th finger until they are healed.  Advised pt I sent prescription to [facility] on [address] in [place]

## 2024-04-02 NOTE — Progress Notes (Signed)
 Follow-Up Visit   Subjective  Ryan Blevins is a 73 y.o. male who presents for the following: check R 5th finger, ~36 hours, swelling, painful, started on R index finger 5 days ago and has improved, pt was at beach and had been eating crab legs,  has been soaking in salt water and applying peroxide, taking tylenol,pt would like to know if an alternative to Otezla  for Psoriasis too expensive  The following portions of the chart were reviewed this encounter and updated as appropriate: medications, allergies, medical history  Review of Systems:  No other skin or systemic complaints except as noted in HPI or Assessment and Plan.  Objective  Well appearing patient in no apparent distress; mood and affect are within normal limits.  A focused examination was performed of the following areas: Right hand  Relevant exam findings are noted in the Assessment and Plan.  R index finger, R 5th finger with Abscesses x 2 Pus pockets in parts of nail folds, erythema and edema of R index and R 5th fingers  Assessment & Plan   PSORIASIS scalp Exam: Well-demarcated erythematous papules/plaques with silvery scale, guttate pink scaly papules.  10% BSA. Chronic and persistent condition with duration or expected duration over one year. Condition is symptomatic / bothersome to patient. Not to goal. Psoriasis is a chronic non-curable, but treatable genetic/hereditary disease that may have other systemic features affecting other organ systems such as joints (Psoriatic Arthritis). It is associated with an increased risk of inflammatory bowel disease, heart disease, non-alcoholic fatty liver disease, and depression.  Treatments include light and laser treatments; topical medications; and systemic medications including oral and injectables.  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 Do not lay down for 30 minutes after taking. Be cautious with sun exposure and use good sun protection while on this medication. Pregnant women should not take this medication.   Incision and Drainage - R index finger, R 5th finger with Abscesses x 2 Location: R 5th finger, R index finger  Informed Consent: Discussed risks (permanent scarring, light or dark discoloration, infection, pain, bleeding, bruising, redness, damage to adjacent structures, and recurrence of the lesion) and benefits of the procedure, as well as the alternatives.  Informed consent was obtained.  Preparation: The area was prepped with alcohol.  Anesthesia: no anesthesia  Procedure Details: An incision was made overlying the lesion. The lesion drained pus and blood.  A small amount of fluid was drained.    Antibiotic ointment and a sterile pressure dressing were applied. The patient tolerated procedure well.  Total number of lesions drained: 2  Plan: The patient was instructed on post-op care. Recommend OTC analgesia as needed for pain.   Aerobic culture - R index finger, R 5th finger with Abscesses x 2  Aerobic culture - R index finger, R 5th finger with Abscesses x 2  Return for as scheduled.  I, Sonya Hupman, RMA, am acting as  scribe for Alm Rhyme, MD .   Documentation: I have reviewed the above documentation for accuracy and completeness, and I agree with the above.  Alm Rhyme, MD

## 2024-04-02 NOTE — Patient Instructions (Signed)

## 2024-04-05 ENCOUNTER — Ambulatory Visit

## 2024-04-05 LAB — AEROBIC CULTURE

## 2024-04-05 NOTE — Progress Notes (Signed)
Patient was present and evaluated for Custom molded foot orthotics. Patient will benefit from CFO's to provide total contact to BIL MLA's helping to balance and distribute body weight more evenly across BIL feet helping to reduce plantar pressure and pain. Orthotic will also encourage FF / RF alignment  Patient was scanned today and will return for fitting upon receipt  Addison Bailey Cped, CFo, CFm

## 2024-04-08 ENCOUNTER — Ambulatory Visit: Payer: Self-pay | Admitting: Dermatology

## 2024-04-08 DIAGNOSIS — A400 Sepsis due to streptococcus, group A: Secondary | ICD-10-CM

## 2024-04-08 MED ORDER — PENICILLIN V POTASSIUM 500 MG PO TABS: 500.0000 mg | ORAL_TABLET | Freq: Two times a day (BID) | ORAL | 0 refills | Status: AC

## 2024-04-08 NOTE — Telephone Encounter (Addendum)
 Called and discussed results with patient. Patient states he is improving and doing much better on current treatment. Patient states he has not allergies to penicillins or amoxicillin . He would like to try stronger treatment.  Patient advised to stop oral Doxycycline . Start Penicillin  VK 500 mg 1 po bid for 1 week disp #14. Continue topical Mupirocin .  Rx sent to [facility] in [place]  Patient verbalized understanding and denied further questions.   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 Bets treatment is Penicillin  or Amoxicillin .  Pt does not have these listed as allergies, but please ask him> May need to change treatment to PCN or Amoxicillin .  Let me know about above answers to questions. ----- Message ----- From: Interface, [facility] Lab Results In Sent: 04/05/2024   7:36 AM EDT To: Alm JAYSON Rhyme, MD

## 2024-04-11 NOTE — Progress Notes
 Date: 04/15/2024  Name: Daryl Graves   MRN#: [id number]   DOB: May 21, 1951   Age: 73 y.o.  ___________________________________________________________    Dr. Shlomo Sang, DO  Rozelle Boas, PA-C     The patient was seen at the [facility] office by Rozelle Boas, PA-C.    CHIEF COMPLAINT: Left knee pain.    INTERIM HISTORY: The patient is a 73 y.o. male that comes to our office for orthopaedic evaluation of his ongoing symptoms. The patient states that the pain has been ongoing for the last {}.     Past Medical History:   Past Medical History:   Diagnosis Date    Arthritis     Coccidioidomycosis     Diabetes mellitus (HCC/RAF)     Peripheral vascular disease        Past Surgical History:   Past Surgical History:   Procedure Laterality Date    bilateral knee replacements      [id number] R2010    bilateral thumb sx      left knee sx      left shoulder sx      [place]    right shoulder sx      with hamilton       Medications:   Current Outpatient Medications   Medication Sig    atorvastatin 10 mg tablet TAKE 1 TABLET BY MOUTH AT BEDTIME    azaTHIOprine 50 mg tablet TAKE TWO TABLETS BY MOUTH EVERY DAY    CELECOXIB  200 mg capsule TAKE 1 CAPSULE BY MOUTH TWO TIMES DAILY.    finasteride 5 mg tablet TAKE 1 TABLET BY MOUTH EVERY DAY    LUMIGAN 0.01 % ophthalmic solution INSTILL 1 DROP IN EACH EYE EVERY EVENING    mesalamine (LIALDA) 1.2 g DR tablet TAKE 4 TABLETS BY MOUTH DAILY    tamsulosin 0.4 mg capsule TAKE ONE CAPSULE BY MOUTH TWO TIMES A DAY     No current facility-administered medications for this visit.       Allergies:   Allergies   Allergen Reactions    Ibuprofen Other (See Comments)     Soars in mouth       Social history:   Social History     Tobacco Use    Smoking status: Never    Smokeless tobacco: Never   Substance Use Topics    Alcohol use: Not Currently       Family history:   Family History   Problem Relation Age of Onset    Cancer Father        REVIEW OF SYSTEMS: The 14-point review of systems as documented today in the medical record is remarkable for the positive orthopedic problems discussed above and their relevance was considered with respect to Constitutional, ENT, Cardiovascular, GU, Skin, Neurologic, Endocrine, Hematologic, Psychiatric, Gastrointestinal, Respiratory, Eyes and Allergic/Immunologic systems.    PHYSICAL EXAM:   Constitutional: Daryl Graves is a 74 y.o. male in no acute distress.   There were no vitals filed for this visit. There is no height or weight on file to calculate BMI.  Psyc: The patient is alert and oriented x3 with a normal mood and affect.   HENT: AT/NC; hearing intact   Eyes: PERRLA. Extra-ocular movements are intact.   Skin: Normal temperature. No gross abnormality.   Lymphatic: No induration or enlargement.   Extremities: {Extremities:2020::''No swelling, pretibial edema, venous stasis ulcers, or varicosities''}. No calf tenderness. No clubbing. No Cyanosis.   Gait:  []  Antalgic [x]  Normal []  Trendelenburg   {  Varus/valgus thrust:2041::''No demonstrable instability''}.  Assistive devices: {Assistive Devices [id number]   Left Knee Exam:   Visual:  {Blank single:19197::''Healed arthroscopic portal incisions'',''A well healed midline incision is noted.'',''Well healed lateral surgical incisions are noted.'',''Surgical scars are noted.'',''No surgical incisions or scars are noted.''}  Erythema present:  []  Yes [x]  No   Bruising or ecchymosis: []  Yes [x]  No     Palpation:   Palpable masses present: []  Yes [x]  No   [x]  No effusion present   []  Small Effusion present []  Moderate Effusion present  []  Large effusion  present  Palpation reveals {Blank single:20442::''mild'',''moderate'',''severe'',''no''} tenderness over the {knee palpation:1995::''medial joint line''}    ROM:   Range of motion is {Blank single:20442::''-5'',''-3'',''3'',''5'',''10'',''0''}? extension to {Blank single:20442::''130'',''125'',''115'',''110'',''105'',''100'',''90'',''80'',''120''}? flexion with {Blank single:20442::''mild'',''moderate'',''severe'',''no''} pain. There is {Blank single:20442::''no crepitus'',''crepitus''} with ROM.   Guarding:   []  Yes [x]  No   The knee is {Blank single:20442::''unstable'',''stable''} with {Blank single:20442::''1+'',''2+'',''3+'',''no''} laxity with {Blank single:20442::''Varus'',''Valgus'',''Varus and Valgus''} stress at 10, 30, 90?.     Strength:  Muscle Strength of the knee flexors and extensors is 5/5. Patellar tracking is normal.     Special tests:   McMurray's test:   []  Positive [x]  Negative []  Not Tested  Reverse McMurray's test: []  Positive [x]  Negative []  Not Tested  Anterior drawer test:  []  Positive [x]  Negative []  Not Tested  Posterior drawer test:  []  Positive [x]  Negative []  Not Tested  Patellar apprehension test: []  Positive [x]  Negative []  Not Tested  Patellar grind test:  []  Positive [x]  Negative []  Not Tested     RADIOGRAPHS: 4 views of {Blank single:20442::''Bilateral knees'',''Left knee and 3 view(s) comparison of the opposite knee'',''Right knee and 3 view(s) comparison of the opposite knee''} were ordered, obtained and reviewed by me here at [facility] today and reviewed with the patient. They show:    Right Knee {Blank single:20442::''osteoarthritis with severity of joint space narrowing, subchondral sclerosis, and periarticular spurring in the following compartments'',''minimal degenerative changes'','' demonstrates a total knee arthroplasty in good position and alignment, no surrounding radiolucencies or signs of osteolysis, patella well aligned.'','' demonstrates a Uni knee arthroplasty in good position and alignment, no surrounding radiolucencies or signs of osteolysis, patella well aligned.''}:  Medial:   {Blank single:20442::''no'',''minimal'',''mild'',''moderate'',''moderately severe'',''severe''}    Lateral:   {Blank single:20442::''no'',''minimal'',''mild'',''moderate'',''moderately severe'',''severe''}    Patellofemoral:  {Blank single:20442::''no'',''minimal'',''mild'',''moderate'',''moderately severe'',''severe''}      Left Knee {Blank single:20442::''osteoarthritis with severity of joint space narrowing, subchondral sclerosis, and periarticular spurring in the following compartments'',''minimal degenerative changes'','' demonstrates a total knee arthroplasty in good position and alignment, no surrounding radiolucencies or signs of osteolysis, patella well aligned.'','' demonstrates a Uni knee arthroplasty in good position and alignment, no surrounding radiolucencies or signs of osteolysis, patella well aligned.''}:  Medial:   {Blank single:20442::''no'',''minimal'',''mild'',''moderate'',''moderately severe'',''severe''}    Lateral:   {Blank single:20442::''no'',''minimal'',''mild'',''moderate'',''moderately severe'',''severe''}    Patellofemoral: {Blank single:20442::''no'',''minimal'',''mild'',''moderate'',''moderately severe'',''severe''}       IMPRESSION:   {}    MDM: We had a lengthy discussion with the patient today regarding his pain.  I discussed with the patient there physical exam findings, imaging and radiographs, the above diagnoses, and the current risks and benefits of treatment options including activity modifications, oral and topical anti-inflammatories, weight loss, physical therapy, injections, bracing, and surgical interventions.    THE PATIENT HAS FAILED THE FOLLOWING PRIOR TREATMENT:  []  No previous treatment   []  Ice/Heat   []  Cane/Walker  []  NSAID   {NSAIDLIST:32528}  []  Physical therapy   []  Cortisone injection   {Cortisone:2051}  []   Viscous-supplementation injection   {Visco:44571}  []  Bracing   {Braces:44572}  []  Narcotic medication    The patient's subjective complaints and objective findings are consistent with the diagnoses above.    After our discussion today they would like to proceed with the following:    PLAN:    {TREATMENT OPTIONS:37269::''- Activity modifications'',''- heat and ice''}    Hopefully this treatment improves her pain and function well, if not we will consider further options which we discussed today.    Informative reading material from [facility] regarding hip osteoarthritis was given to the patient today.    They agreed with the plan above and had no further questions.    NEXT APPOINTMENT:  {Blank single:20442::''PRN'',''1 week'',''2 weeks'',''6 weeks'',''1 month'',''2 months'',''3 months'',''4 months'',''5 months'',''6 months'',''1 year''}.    Thank you for allowing me to participate in Branton Einstein Premo?s care. Please do not hesitate to contact me if you have any questions or concerns.    The patient was examined and evaluated by Rozelle Boas PA-C for Dr. Lyndle, D.O.      Rozelle D. Boas LOFTS, PA-C  Physician assistant for Dr. Lyndle, D.O.  [facility]   04/11/2024    RR:[id number], Jacquelyn Ask, MD

## 2024-04-15 ENCOUNTER — Ambulatory Visit: Payer: Commercial Managed Care - HMO | Attending: Medical

## 2024-05-06 ENCOUNTER — Ambulatory Visit (INDEPENDENT_AMBULATORY_CARE_PROVIDER_SITE_OTHER)

## 2024-05-06 DIAGNOSIS — M2142 Flat foot [pes planus] (acquired), left foot: Secondary | ICD-10-CM | POA: Diagnosis not present

## 2024-05-06 DIAGNOSIS — M7751 Other enthesopathy of right foot: Secondary | ICD-10-CM | POA: Diagnosis not present

## 2024-05-06 DIAGNOSIS — M2141 Flat foot [pes planus] (acquired), right foot: Secondary | ICD-10-CM | POA: Diagnosis not present

## 2024-05-06 DIAGNOSIS — M7752 Other enthesopathy of left foot: Secondary | ICD-10-CM

## 2024-05-06 NOTE — Progress Notes (Signed)
 Patient presents today to pick up custom molded foot orthotics, diagnosed with Capsulitis and Pes planus by Dr. Verta.   Orthotics were dispensed and fit was satisfactory. Reviewed instructions for break-in and wear. Written instructions given to patient.  Patient will follow up as needed.   Lolita Schultze CPed, CFo, CFm

## 2024-05-07 ENCOUNTER — Telehealth: Payer: Commercial Managed Care - HMO

## 2024-05-07 ENCOUNTER — Other Ambulatory Visit: Payer: Commercial Managed Care - HMO

## 2024-05-07 ENCOUNTER — Telehealth: Payer: PRIVATE HEALTH INSURANCE

## 2024-05-07 NOTE — Telephone Encounter
 Call Back Request      Reason for call back:   Patient called to schedule appt for removal cyst on left knee. Patient has had bilateral knee replacement. Patient seen at [facility].  Referred by DR Quintella Please advise  Any Symptoms:  [x]  Yes  []  No      If yes, what symptoms are you experiencing:    Duration of symptoms (how long):  left knee pain  Have you taken medication for symptoms (OTC or Rx):      If call was taken outside of clinic hours:    [] Patient or caller has been notified that this message was sent outside of normal clinic hours.     [] Patient or caller has been warm transferred to the physician's answering service. If applicable, patient or caller informed to please call us  back if symptoms progress.  Patient or caller has been notified of the turnaround time of 1-2 business day(s).

## 2024-05-07 NOTE — Telephone Encounter
 In addition to sending the patent a message via My Chart, I have left a VM message informing him of the scheduling error and appointment cancellation. Patient has a Hx of TKA, thus treatment is out of scope for Dr. Darcey.

## 2024-05-07 NOTE — Telephone Encounter
 Patient called back and I have transferred him to the [facility] for assistance.

## 2024-05-08 ENCOUNTER — Ambulatory Visit (INDEPENDENT_AMBULATORY_CARE_PROVIDER_SITE_OTHER): Admitting: Family Medicine

## 2024-05-08 ENCOUNTER — Encounter: Payer: Self-pay | Admitting: Family Medicine

## 2024-05-08 VITALS — BP 148/80 | HR 83 | Temp 98.0°F | Ht 71.5 in | Wt 204.1 lb

## 2024-05-08 DIAGNOSIS — R21 Rash and other nonspecific skin eruption: Secondary | ICD-10-CM | POA: Diagnosis not present

## 2024-05-08 DIAGNOSIS — K644 Residual hemorrhoidal skin tags: Secondary | ICD-10-CM | POA: Diagnosis not present

## 2024-05-08 MED ORDER — HYDROCORTISONE 2.5 % EX CREA: TOPICAL_CREAM | Freq: Two times a day (BID) | CUTANEOUS | 2 refills | Status: AC

## 2024-05-08 MED ORDER — HYDROCORTISONE ACETATE 25 MG RE SUPP: 25.0000 mg | Freq: Two times a day (BID) | RECTAL | 2 refills | Status: AC

## 2024-05-08 NOTE — Progress Notes (Signed)
 Tanasha Menees T. Leylany Nored, MD, CAQ Sports Medicine [facility] at [facility] [address] [place], [address]  Phone: [phone]  FAX: [phone]  Ryan Blevins - 73 y.o. male  MRN [id number]  Date of Birth: 1951-05-20  Date: 05/08/2024  PCP: Cleatus Arlyss RAMAN, MD  Referral: Cleatus Arlyss RAMAN, MD  Chief Complaint  Patient presents with   Hemorrhoids   Subjective:   Ryan Blevins is a 73 y.o. very pleasant male patient with Body mass index is 28.07 kg/m. who presents with the following:  Discussed the use of AI scribe software for clinical note transcription with the patient, who gave verbal consent to proceed.  External irritation  History of Present Illness Ryan Blevins is a 73 year old male who presents with hemorrhoids causing itching and soreness.  He has been experiencing hemorrhoids for the past two months, which began after an episode of severe constipation. He attributes the onset to straining during bowel movements. His symptoms include significant itching, soreness, and a sensation of rawness, described as feeling like he is 'sitting on my raw butt.' These symptoms have been persistent and bothersome, particularly as he is planning to fly to [place]  next week, a four-hour flight.  He has used both topical creams and suppositories, specifically mentioning Preparation H, which has provided only minimal relief. The hemorrhoids were protruding but have since retracted. However, he has experienced bleeding over the past three to four days, with the most recent episode occurring yesterday. He is concerned about the irritation and skin discomfort being the most bothersome aspect of his condition.  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   Review of Systems is noted in the HPI, as appropriate  Objective:   BP (!) 148/80   Pulse 83    Temp 98 F (36.7 C) (Temporal)   Ht 5' 11.5 (1.816 m)   Wt 204 lb 2 oz (92.6 kg)   SpO2 98%   BMI 28.07 kg/m   GEN: No acute distress; alert,appropriate. PULM: Breathing comfortably in no respiratory distress PSYCH: Normally interactive.   GU/rectal.  Small ext hemorrhoid, with extensive skin irritation and redness  Laboratory and Imaging Data:  Results   Assessment and Plan:     ICD-10-CM   1. External hemorrhoid  K64.4     2. Rash  R21       Assessment and Plan Assessment & Plan Hemorrhoids with perianal irritation Combination of internal and external hemorrhoids with significant perianal irritation. Symptoms include itching, soreness, and recent onset of bleeding. Current over-the-counter treatments provide minimal relief.   Discussed potential for banding if hemorrhoids become persistently painful and swollen. - Prescribe hydrocortisone  suppositories for internal hemorrhoids. - Prescribe topical hydrocortisone  cream for external perianal irritation.     Medication Management during today's office visit: Meds ordered this encounter  Medications   hydrocortisone  (ANUSOL -HC) 25 MG suppository    Sig: Place 1 suppository (25 mg total) rectally 2 (two) times daily.    Dispense:  12 suppository    Refill:  2   hydrocortisone  2.5 % cream    Sig: Apply topically 2 (two) times daily.    Dispense:  30 g    Refill:  2   There are no discontinued medications.  Orders placed today for conditions managed today: No orders of the defined types were placed in this encounter.  Disposition: No follow-ups on file.  [employer] speech-to-text software was used for transcription in this dictation.  Possible transcriptional errors can occur using Animal nutritionist.   Signed,  Jacques DASEN. Shanyce Daris, MD   Outpatient Encounter Medications as of 05/08/2024  Medication Sig   Apremilast  (OTEZLA ) 30 MG TABS Take 1 tablet (30 mg total) by mouth 2 (two) times daily.    ASPIRIN 81 PO Take by mouth daily.   cetirizine (ZYRTEC) 10 MG tablet Take 10 mg by mouth daily.   Clobetasol  Propionate 0.05 % shampoo Apply 0.5 % topically.   colchicine  0.6 MG tablet Take 1 tablet (0.6 mg total) by mouth daily as needed.   fluticasone  (FLONASE ) 50 MCG/ACT nasal spray SHAKE LIQUID AND USE 2 SPRAYS IN EACH NOSTRIL DAILY   Halobetasol  Propionate 0.05 % FOAM Apply to aa's scalp QD PRN. Avoid applying to face, groin, and axilla. Use as directed. Long-term use can cause thinning of the skin.   hydrocortisone  (ANUSOL -HC) 25 MG suppository Place 1 suppository (25 mg total) rectally 2 (two) times daily.   hydrocortisone  2.5 % cream Apply topically 2 (two) times daily.   ketoconazole  (NIZORAL ) 2 % shampoo Apply 1 Application topically 3 (three) times a week. Wash scalp 3 times weekly, let sit for 5 minutes and rinse out   lisinopril  (ZESTRIL ) 30 MG tablet Take 1 tablet (30 mg total) by mouth daily.   mupirocin  ointment (BACTROBAN ) 2 % Apply 1 Application topically 3 (three) times daily. Tid to affected fingers   Roflumilast  (ZORYVE ) 0.3 % CREA Apply 1 Application topically as directed. Apply to aa scalp 3 days a week at bedtime Tuesday, Thursday and Saturday   Roflumilast , Antiseborrheic, (ZORYVE ) 0.3 % FOAM Apply 1 Application topically at bedtime.   rosuvastatin  (CRESTOR ) 10 MG tablet TAKE ONE TABLET BY MOUTH DAILY   sildenafil  (REVATIO ) 20 MG tablet Take 4-5 tablets (80-100 mg total) by mouth daily as needed.   tamsulosin  (FLOMAX ) 0.4 MG CAPS capsule TAKE 1 CAPSULE BY MOUTH DAILY   Tapinarof  (VTAMA ) 1 % CREA Apply to aa's psoriasis QD PRN.   venlafaxine  XR (EFFEXOR -XR) 75 MG 24 hr capsule Take 2 capsules (150 mg total) by mouth daily.   No facility-administered encounter medications on file as of 05/08/2024.

## 2024-05-09 NOTE — Telephone Encounter
 Call Back Request      Reason for call back: Patient would like to know if referral and medical records were received via fax from referring doctors office so they can proceed with scheduling with an ortho joint specialist please call patient to confirm.   Patient has seen Dr. Lyndle in [place] [phone]    Any Symptoms:  []  Yes  [x]  No      If yes, what symptoms are you experiencing:    Duration of symptoms (how long):    Have you taken medication for symptoms (OTC or Rx):      If call was taken outside of clinic hours:    [x] Patient or caller has been notified that this message was sent outside of normal clinic hours.     [] Patient or caller has been warm transferred to the physician's answering service. If applicable, patient or caller informed to please call us  back if symptoms progress.  Patient or caller has been notified of the turnaround time of 1-2 business day(s).

## 2024-05-10 ENCOUNTER — Ambulatory Visit: Admitting: Family Medicine

## 2024-05-10 NOTE — Telephone Encounter
Forwarded by: Soraya Paquette D Jayelyn Barno

## 2024-05-10 NOTE — Telephone Encounter
 Forwarded by: Nikolis Berent      Hi Jen,     Can you please review chart.    Thank you    Almarie

## 2024-05-13 NOTE — Progress Notes
 Date: 05/16/2024  Name: Daryl Graves   MRN#: [id number]   DOB: Jun 01, 1951   Age: 73 y.o.      Dr. Shlomo Sang, DO      The patient was seen at the [facility] office.    CHIEF COMPLAINT: Status-post open left knee ganglion cyst removal/Suspected aseptic knee infection    INTERIM HISTORY: Daryl Graves presents  year post-operatively from a open left knee ganglion cyst removal. Since the patient's last office visit, his symptoms have worsened in severity. He denotes presenting to the emergency room at [facility] secondary to his ganglion cyst returning and his knee becoming infected. He states his cyst burst approximately 3-4 weeks. Patient was on Keflex for approximately 4 weeks.  He reminds me that he underwent surgical intervention with Dr. Alicia, including a muscle flap implantation of the left knee. He denies any feelings of mechanical locking or catching. However, he continues to have difficulty with terminal flexion. He denies any fevers, chills, or night sweats at this time. The patient is accompanied by his wife on today's visit.      PHYSICAL EXAM:  Constitutional: Daryl Graves is a 73 y.o. male in no acute distress.   Psyc: he is alert and oriented x3 with a normal mood and affect.   HENT: AT/NC; hearing intact  Eyes: PERRLA. Extra-ocular movements are intact.  Skin: Normal temperature. There are no rashes, ulcerations, or lesions.  Lymphatic: No induration or enlargement.  Extremities: No swelling or calf tenderness. No clubbing. No cyanosis.  Gait:  []  Antalgic [x]  Normal []  Trendelenburg   Assistive devices: none    Left knee: Wound dehiscence is noted.   There is evidence of drainage. Possible signs or symptoms of infection. There is no knee joint swelling.  A no knee joint effusion is present. Palpation of the knee reveals no tenderness. There is no calf tenderness with palpation. Negative Homan's sign.    Range of motion is 3? extension to 70? flexion with moderate  pain. Knee is stable with +2 laxity with varus/valgus stress at 0,30, and 90 degree. Normal patellar tracking. Muscle Strength of the knee is 5/5 with respect to flexors and extensors.         RADIOGRAPHS: Three views of the left knee and 3 view(s) comparison of the opposite knee were ordered, obtained and reviewed by me here at [facility] today and reviewed with the patient. Radiographs demonstrate a TKA in good position and alignment.  There is optimal implant cement-bone interface without radiolucencies. There is no evidence of loosening or other abnormalities. The patella is well located.       MRI: MRI of the left knee was performed at [facility] on December 13, 2021. Study shows:  There is no interval change from the prior most recent study of July 2022.  There is a total knee arthroplasty in place with no subluxation fracture or loosening. There is still focal areas of interosseous cystic change along the posterior lateral and medial femoral condyle.  Complete stability to the greater than 5 cm cystic mass deep within the subcutaneous areas along the lateral joint.     IMPRESSION:  1.  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 Lenor  3.  Status-post open left knee ganglion cyst removal DOS 12/24/2020   4.  Status post left knee surgery with muscle flap implantation with Dr. Alicia  5.  Impending arthrofibrosis    PLAN: We had a lengthy discussion with the patient today regarding his condition.There is evidence drainage from muscle flap site of repair, likely a result of ganglion cyst rupture concerning for infection.     A aspiration was recommended to the patient. This aspiration is both diagnostic and therapeutic. The risks associated with the injection were discussed with the patient and they have elected to proceed. Aspiration failed to yield any aspirate or fluid. This is a positive prognostic indicator. As such, we will also request labwork, CRP and ESR levels, for further laboratory analysis of infection. In addition, we will also request authorization for an MRI with MARS of the left knee to assess for  any other abnormality.    The patient is instructed to use ice, heat, and OTC pain medications as necessary to control any pain or swelling.  If the patient notes any increased pain or swelling they should contact the office immediately. The patient may continue activities as tolerated.    The patient is to gradually resume his activity. he should begin with 10% of his normal activities and increase this by 10% per episode that is well tolerated. If the patient develops pain he is to return to the level where they had no pain for 2-3 days before increasing activity again.     Medical management is initiated in office today. A prescription of:  Requested Prescriptions     Signed Prescriptions Disp Refills    ALPRAZolam  0.5 mg tablet 2 tablet 0     Sig: Take one pill one hour before MRI. If needed may have another. Do not drive. Have driver arranged        This prescription was sent electronically to the pharmacy.  Medication counseling was performed.    FOLLOW-UP: Return via telemedicine encounter after laboratory work.     Thank you for allowing me to participate in Daryl Graves?s care. Please do not hesitate to contact me if you have any questions or concerns.           Shlomo Sang, D.O.  Adult Reconstruction Specialist  ORTHOPEDIC SURGERY  05/16/2024

## 2024-05-13 NOTE — Telephone Encounter
 Patient is scheduled to be seen 05/16/2024 with [facility] in [place].      Dr. Chrystine first availability is in October 2025.

## 2024-05-14 NOTE — Telephone Encounter
 Call Back Request      Reason for call back: Patient is following up on scheduling appt, did inform and offer next available, however patient will wait for other providers to review hoping they may be able to see him sooner. Please advise.    Any Symptoms:  []  Yes  []  No      If yes, what symptoms are you experiencing:    Duration of symptoms (how long):    Have you taken medication for symptoms (OTC or Rx):      If call was taken outside of clinic hours:    [] Patient or caller has been notified that this message was sent outside of normal clinic hours.     [] Patient or caller has been warm transferred to the physician's answering service. If applicable, patient or caller informed to please call us  back if symptoms progress.  Patient or caller has been notified of the turnaround time of 1-2 business day(s).

## 2024-05-14 NOTE — Telephone Encounter
 Reply by: Delon Coon Reesha Debes    Hi Tiff,     We can see at our next available however, his current authorization is for [facility] not our department so he would need to obtain from PCP or referring physician prior to being seen as we do not submit auth's for new patients.

## 2024-05-15 NOTE — Telephone Encounter
 Call Back Request      Reason for call back:   Patient calling in requesting update on encounter in messages below.-  Was advised New Authorization for our department was sent over by his insurance plan sometime last week 08/18-08/27.    Please upload auth in system and call patient and assist with scheduling for the soonest availability with any provider, thank you.  CBN: [phone]  Any Symptoms:  []  Yes  [x]  No      If yes, what symptoms are you experiencing:    Duration of symptoms (how long):    Have you taken medication for symptoms (OTC or Rx):      If call was taken outside of clinic hours:    [] Patient or caller has been notified that this message was sent outside of normal clinic hours.     [] Patient or caller has been warm transferred to the physician's answering service. If applicable, patient or caller informed to please call us  back if symptoms progress.  Patient or caller has been notified of the turnaround time of 1-2 business day(s).

## 2024-05-16 ENCOUNTER — Ambulatory Visit: Payer: Commercial Managed Care - HMO | Attending: Sports Medicine

## 2024-05-16 ENCOUNTER — Ambulatory Visit: Payer: Commercial Managed Care - HMO | Attending: Plastic and Reconstructive Surgery

## 2024-05-16 ENCOUNTER — Ambulatory Visit: Payer: Commercial Managed Care - HMO

## 2024-05-16 DIAGNOSIS — M25862 Other specified joint disorders, left knee: Secondary | ICD-10-CM

## 2024-05-16 MED ORDER — ALPRAZOLAM 0.5 MG PO TABS
ORAL_TABLET | ORAL | 0 refills | 10.00000 days | Status: AC
Start: 2024-05-16 — End: ?

## 2024-05-16 NOTE — Telephone Encounter
 Forwarded by: Deanna Artis

## 2024-05-17 NOTE — Telephone Encounter
 SCHEDULED

## 2024-05-17 NOTE — Telephone Encounter
 Reply by: Delon Coon Denetta Fei    Thank you Tiff, please schedule next available.

## 2024-05-22 ENCOUNTER — Telehealth: Payer: PRIVATE HEALTH INSURANCE | Attending: Medical

## 2024-05-22 ENCOUNTER — Ambulatory Visit: Payer: PRIVATE HEALTH INSURANCE | Attending: Sports Medicine

## 2024-05-22 DIAGNOSIS — M25862 Other specified joint disorders, left knee: Principal | ICD-10-CM

## 2024-05-22 MED ORDER — DOXYCYCLINE HYCLATE 100 MG PO TABS
100 mg | ORAL_TABLET | Freq: Two times a day (BID) | ORAL | 0 refills | 14.00000 days | Status: AC
Start: 2024-05-22 — End: ?

## 2024-05-22 NOTE — Telephone Encounter
 Date: 05/22/2024  Name: Daryl Graves   MRN#: [id number]   DOB: 12-Aug-1951   Age: 73 y.o.      Dr. Shlomo Sang, DO  Rozelle Boas, PA-C      Telephone encounter by Rozelle Boas, PA-C.    CHIEF COMPLAINT: Status-post open left knee ganglion cyst removal/Suspected aseptic knee infection    INTERIM HISTORY: Daryl Graves presents 1 year post-operatively from a open left knee ganglion cyst removal. Since the patient's last office visit, his symptoms have worsened in severity. He has had symptoms of infection for about 4-5 weeks. He denotes presenting to the emergency room at [facility] secondary to his ganglion cyst returning and his knee becoming infected. Patient was on Keflex for approximately 4 weeks.  He reminds me that he underwent surgical intervention with Dr. Alicia, including a muscle flap implantation of the left knee. He denies any feelings of mechanical locking or catching. However, he continues to have difficulty with terminal flexion. He denies any fevers, chills, or night sweats at this time more nausea or vomiting. Patient notes drainage from the knee and that his knee pain is worsening.    Today's phone call is to review labs.     PHYSICAL EXAM as of 05/16/24:  Constitutional: Daryl Graves is a 73 y.o. male in no acute distress.   Psyc: he is alert and oriented x3 with a normal mood and affect.   HENT: AT/NC; hearing intact  Eyes: PERRLA. Extra-ocular movements are intact.  Skin: Normal temperature. There are no rashes, ulcerations, or lesions.  Lymphatic: No induration or enlargement.  Extremities: No swelling or calf tenderness. No clubbing. No cyanosis.  Gait:  []  Antalgic [x]  Normal []  Trendelenburg   Assistive devices: none    Left knee: Wound dehiscence is noted.   There is evidence of drainage. Possible signs or symptoms of infection. There is no knee joint swelling.  A no knee joint effusion is present. Palpation of the knee reveals no tenderness. There is no calf tenderness with palpation. Negative Homan's sign.    Range of motion is 3? extension to 70? flexion with moderate  pain. Knee is stable with +2 laxity with varus/valgus stress at 0,30, and 90 degree. Normal patellar tracking. Muscle Strength of the knee is 5/5 with respect to flexors and extensors.       RADIOGRAPHS: Three views of the left knee and 3 view(s) comparison of the opposite knee were reviewed by me here at [facility] today and reviewed with the patient. Radiographs demonstrate a TKA in good position and alignment.  There is optimal implant cement-bone interface without radiolucencies. There is no evidence of loosening or other abnormalities. The patella is well located.       MRI: MRI of the left knee was performed at [facility] on December 13, 2021. Study shows:  There is no interval change from the prior most recent study of July 2022.  There is a total knee arthroplasty in place with no subluxation fracture or loosening. There is still focal areas of interosseous cystic change along the posterior lateral and medial femoral condyle.  Complete stability to the greater than 5 cm cystic mass deep within the subcutaneous areas along the lateral joint.    LABS: labs as of 05/17/24  Sedimentation rate: 17  CRP: 15    IMPRESSION:  1.  Suspected subacute knee infection with draining sinus  2. 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 Lenor  3.  Status-post open left knee ganglion cyst removal DOS 12/24/2020   4.  Status post left knee surgery with muscle flap implantation with Dr. Alicia  5.  Impending arthrofibrosis    PLAN: We had a lengthy discussion with the patient today regarding his condition.  The patient may indeed likely have a septic knee given his sed rate and CRP. At this point I will give the patient an oral antibiotic doxycycline  100 mg twice daily to prevent worsening infection or even sepsis.  We can consider revision surgery the next visit, or aspirating the knee again potentially to acquire synovial joint fluid to confirm infection.  He understands that if he has any symptoms of fever, nausea or vomiting, chills, night sweats, he should go to the emergency department as infection of his knee could lead to sepsis and cause these symptoms.    FOLLOW-UP: 2 weeks     Thank you for allowing me to participate in Rollan Roger Baity?s care. Please do not hesitate to contact me if you have any questions or concerns.      Clotilda Hafer D. Krystal LOFTS, PA-C  Physician assistant for Shlomo Sang, D.O.  05/22/2024

## 2024-05-23 NOTE — Progress Notes
 Date: 05/23/2024  Name: Daryl Graves   MRN#: [id number]   DOB: [birth year]   Age: 73 y.o.      Dr. Shlomo Sang, DO  Rozelle Boas, [place]      CHIEF COMPLAINT: Status-post open left knee ganglion cyst removal/Suspected aseptic knee infection    INTERIM HISTORY: Daryl Graves presents 1 year post-operatively from a open left knee ganglion cyst removal. Since the patient's last office visit, his symptoms have worsened in severity. He has had symptoms of infection for about 4-5 weeks. He denotes presenting to the emergency room at [facility] secondary to his ganglion cyst returning and his knee becoming infected. Patient was on Keflex for approximately 4 weeks.  He reminds me that he underwent surgical intervention with Dr. Alicia, including a muscle flap implantation of the left knee. He denies any feelings of mechanical locking or catching. However, he continues to have difficulty with terminal flexion. He denies any fevers, chills, or night sweats at this time more nausea or vomiting. Patient notes drainage from the knee and that his knee pain is worsening.    PHYSICAL EXAM:  Constitutional: Daryl Graves is a 73 y.o. male in no acute distress.   Psyc: he is alert and oriented x3 with a normal mood and affect.   HENT: AT/NC; hearing intact  Eyes: PERRLA. Extra-ocular movements are intact.  Skin: Normal temperature. There are no rashes, ulcerations, or lesions.  Lymphatic: No induration or enlargement.  Extremities: No swelling or calf tenderness. No clubbing. No cyanosis.  Gait:  []  Antalgic [x]  Normal []  Trendelenburg   Assistive devices: none    Left knee: Wound dehiscence is noted.   There is evidence of drainage. Possible signs or symptoms of infection. There is no knee joint swelling.  A no knee joint effusion is present. Palpation of the knee reveals no tenderness. There is no calf tenderness with palpation. Negative Homan's sign.    Range of motion is 3? extension to 70? flexion with moderate  pain. Knee is stable with +2 laxity with varus/valgus stress at 0,30, and 90 degree. Normal patellar tracking. Muscle Strength of the knee is 5/5 with respect to flexors and extensors.       RADIOGRAPHS: Three views of the left knee and 3 view(s) comparison of the opposite knee were reviewed by me here at [facility]: [facility] today and reviewed with the patient. Radiographs demonstrate a TKA in good position and alignment.  There is optimal implant cement-bone interface without radiolucencies. There is no evidence of loosening or other abnormalities. The patella is well located.       MRI: MRI of the left knee was performed at [facility] on December 13, 2021. Study shows:  There is no interval change from the prior most recent study of July 2022.  There is a total knee arthroplasty in place with no subluxation fracture or loosening. There is still focal areas of interosseous cystic change along the posterior lateral and medial femoral condyle.  Complete stability to the greater than 5 cm cystic mass deep within the subcutaneous areas along the lateral joint.    LABS: labs as of 05/17/24  Sedimentation rate: 17  CRP: 15    IMPRESSION:  1.  Suspected subacute knee infection with draining sinus  2. 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 Lenor  3.  Status-post open left knee ganglion cyst removal DOS 12/24/2020   4.  Status post left knee surgery  with muscle flap implantation with Dr. Alicia  5.  Impending arthrofibrosis    PLAN: We had a lengthy discussion with the patient today regarding his condition.  The patient may indeed likely have a septic knee given his sed rate and CRP. At this point I will give the patient an oral antibiotic doxycycline  100 mg twice daily to prevent worsening infection or even sepsis.  We can consider revision surgery the next visit, or aspirating the knee again potentially to acquire synovial joint fluid to confirm infection.  He understands that if he has any symptoms of fever, nausea or vomiting, chills, night sweats, he should go to the emergency department as infection of his knee could lead to sepsis and cause these symptoms.    FOLLOW-UP: 2 weeks     Thank you for allowing me to participate in Daryl Graves care. Please do not hesitate to contact me if you have any questions or concerns.         Shlomo Sang, D.O.  Adult Reconstruction Specialist  ORTHOPEDIC SURGERY  05/23/2024

## 2024-05-28 ENCOUNTER — Other Ambulatory Visit: Payer: PRIVATE HEALTH INSURANCE

## 2024-05-28 ENCOUNTER — Inpatient Hospital Stay: Payer: PRIVATE HEALTH INSURANCE

## 2024-05-28 ENCOUNTER — Other Ambulatory Visit: Payer: Commercial Managed Care - HMO

## 2024-05-28 ENCOUNTER — Ambulatory Visit: Payer: Commercial Managed Care - HMO

## 2024-05-28 ENCOUNTER — Ambulatory Visit: Payer: PRIVATE HEALTH INSURANCE | Attending: Medical

## 2024-05-28 DIAGNOSIS — Z96659 Presence of unspecified artificial knee joint: Secondary | ICD-10-CM

## 2024-05-28 DIAGNOSIS — T84018A Broken internal joint prosthesis, other site, initial encounter: Principal | ICD-10-CM

## 2024-05-28 NOTE — Progress Notes
 CC pain in left artificial knee joint     HPI pt is a 73 y.o. male  c/o LEFT knee pain for several weeks.  Patient had a left knee replacement done in 2009.  Patient with past medical history of ulcerative colitis or remains on medication for that.  Patient also with diabetes mellitus type 2.  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  Patient has continued to have increased swelling of the knee and has had a draining cyst for least 6 weeks now.  Patient has had some low-grade fevers occasionally and chronic pain in the knee.  Patient also difficulty walking on it as well.  The knee is very stiff unusual only a few degrees without significant severe pain.  Patient denies any back issues or paresthesias dysesthesias distally.    Past Medical History:   Diagnosis Date    Arthritis     Coccidioidomycosis     Diabetes mellitus (HCC/RAF)     Peripheral vascular disease       Past Surgical History:   Procedure Laterality Date    bilateral knee replacements      [id number] [id number]    bilateral thumb sx      left knee sx      left shoulder sx      copolla    right shoulder sx      with [facility]ilton      Ibuprofen   Outpatient Medications Prior to Visit   Medication Sig    ALPRAZolam  0.5 mg tablet Take one pill one hour before MRI. If needed may have another. Do not drive. Have driver arranged    atorvastatin 10 mg tablet TAKE 1 TABLET BY MOUTH AT BEDTIME    azaTHIOprine 50 mg tablet TAKE TWO TABLETS BY MOUTH EVERY DAY    CELECOXIB  200 mg capsule TAKE 1 CAPSULE BY MOUTH TWO TIMES DAILY.    doxycycline  hyclate 100 mg tablet Take 1 tablet (100 mg total) by mouth two (2) times daily.    finasteride 5 mg tablet TAKE 1 TABLET BY MOUTH EVERY DAY    LUMIGAN 0.01 % ophthalmic solution INSTILL 1 DROP IN EACH EYE EVERY EVENING    mesalamine (LIALDA) 1.2 g DR tablet TAKE 4 TABLETS BY MOUTH DAILY    tamsulosin 0.4 mg capsule TAKE ONE CAPSULE BY MOUTH TWO TIMES A DAY     No facility-administered medications prior to visit.        There were no vitals filed for this visit.     P.E.    Gen - NAD, well developed , well nourished, in no apparent distress   HEENT NCAT  EOMI, MM moist, sclera non-icteric  NECK - FROM, supple, no JVD, no thyromegally  CV - RRR, pal puls, no C.C.E  LUNGD - good air intake, no wheezing  Neuro/Psych  CN 2-12 intact B/L, A&Ox 3  SKIN - WNL - no erythema, rash  GI ABD no protruberant, NT, ND    B/L UE - nl exam - FROM, pt NVI to motor and sensation  B/L hips - equal ROM to int/external,      LLE  nl exam - pt NV to motor/Sensation, +EHL/FHL, knee want to touch inflamed swollen with draining sinus laterally.  Range of motion approximately 10? 0-10.  Well-healed anterior knee scar with a draining sinus along the lateral incision from the  previous lateral gastroc flap    RLE  ROS   12 point comprehensive ROS filled out by pt - see in chart    Imaging ; bilateral total knee replacements.  Small amounts of heterotopic calcifications scattered in her around the left knee.  No obvious signs of osteolysis or loosening or subsidence.  The left knee appears to have a broken poly medially with metal possibly touching metal.      A/P  73 year old male past medical history of bilateral total knees with pain swelling in the left knee for several months now.  Patient also with draining sinus for 6 weeks which is a major criteria for infection risks and factors include diabetes mellitus as well as ulcerative colitis on medication.      Discussed with the patient while this is possible an effusion from or pain from the ulcer colitis and/or possibly the broken poly spacer, more than likely this is a chronic infection.    At the time of surgery discussed that I will try to replace the poly if able to.  Initially though I will obtain an open biopsy looking for acute inflammation and obviously infection.  If there is an infection I have discussed with the patient that this will need to be treated in the multi staged fashion with removal of the total knee cement I and D followed by placement of a antibiotic cement spacer with subsequent additional surgery later on once the infection is cleared.      Did discuss with the patient that with these chronic knee infections that sometimes fusion and/or amputation can be the end result although typically treating infections often can have a fairly good success rate in the high 80s to 90s%.    At this point the knee while sterilely prepared had an 18 gauge needle introduced into the knee joint with only possibly 1 cc of fluid being removed which was clear.  This is likely the lidocaine  added to the knee to help with the aspiration.  This will still be sent for Synovasure testing.

## 2024-05-29 DIAGNOSIS — M65321 Trigger finger, right index finger: Secondary | ICD-10-CM

## 2024-05-29 DIAGNOSIS — M65342 Trigger finger, left ring finger: Secondary | ICD-10-CM

## 2024-05-29 DIAGNOSIS — G5601 Carpal tunnel syndrome, right upper limb: Secondary | ICD-10-CM

## 2024-05-29 DIAGNOSIS — G5602 Carpal tunnel syndrome, left upper limb: Principal | ICD-10-CM

## 2024-05-29 NOTE — Progress Notes
 INTERIM HISTORY:     The patient is seen in follow-up after nerve conduction test EMG.    The patient reports      []  {Desc; mild/moderate/severe:10411} pain   []  Pain is located   []  Numbness and tingling  []  Triggering   []  Weakness   []  Stiffness  []  Swelling  []  Attending OT   []  Using a splint      REFERRING PHYSICIAN: None; Self-Referred    HISTORY OF PRESENT ILLNESS: Daryl Graves is a 73 y.o. right-hand-dominant male who  presents today for an orthopedic evaluation of their right hand. The patient complains of triggering at the right index finger. The patient also complains of locking at the left ring finger which he has to manually unlock. The patient also complains of numbness and tingling at the bilateral radial 4 digits. Symptoms began without specific trauma or injury approximately 6 months ago. His pain is sharp in quality. It is a 8/10 in severity and intermittent in frequency. Symptoms have been worsening over the passage of time. Symptoms are alleviated with rest and aggravated with use.  The patient reports limitations to opening water bottles and making a fist.     The patient denies numbness and tingling and neck pain except as noted above.     Prior treatment has included:   [x]   No previous treatment   []   Ice/Heat   []   NSAID's  []   Physical therapy   []   Cortisone injection   []   Bracing  []   Pain medication   []   Surgery    PAST MEDICAL HISTORY:   Past Medical History:   Diagnosis Date    Arthritis     Coccidioidomycosis     Diabetes mellitus (HCC/RAF)     Peripheral vascular disease        MEDICATIONS:   Current Outpatient Medications:     ALPRAZolam  0.5 mg tablet    atorvastatin 10 mg tablet    azaTHIOprine 50 mg tablet    CELECOXIB  200 mg capsule    doxycycline  hyclate 100 mg tablet    finasteride 5 mg tablet    LUMIGAN 0.01 % ophthalmic solution    mesalamine (LIALDA) 1.2 g DR tablet    tamsulosin 0.4 mg capsule    ALLERGIES:   Allergies   Allergen Reactions    Ibuprofen Other (See Comments)     Soars in mouth       PAST SURGICAL/HOSPITALIZATION HISTORY:   Past Surgical History:   Procedure Laterality Date    bilateral knee replacements      [id number]    bilateral thumb sx      left knee sx      left shoulder sx      [place]    right shoulder sx      with [facility]       SOCIAL HISTORY:   Social History     Tobacco Use    Smoking status: Never    Smokeless tobacco: Never   Substance Use Topics    Alcohol use: Not Currently       FAMILY HISTORY:   Family History       Relation Problem Comments    Mother (Deceased)        Father (Deceased)    Cancer                REVIEW OF SYSTEMS:        05/10/2024  1:42 PM   Review of Systems   Fevers No   Unusual fatigue No   Very sudden vision change No   Eye pain or irritation No   Runny nose No   Sore throat No   Chest pain No   Swelling of legs/ankles No   Cough No   Shortness of breath No   Nausea No   Vomiting No   Abdominal pain No   Diarrhea No   Constipation No   Genital lesions No   Unusual discharge No   Pain on urination No   Body aches No   Joint aches Yes   Skin lesions Yes   Rash No   Weakness No   Numbness Yes   Depression No   Anxiety No   Weight change No   Hair loss No   Unusually frequent urination No   Bleeding No   Swollen lymph nodes (glands) No   Allergies No   Unusually frequent infections No         VITALS:   There were no vitals filed for this visit.      BMI: There is no height or weight on file to calculate BMI.    PHYSICAL EXAMINATION:   GENERAL: No acute distress, pleasant  HEENT: Anicteric, normocephalic, normal extraocular movements  NECK: Normal range of motion  PSYCHIATRIC: Normal mood, normal affect  RESPIRATIONS: Normal, unlabored  SKIN: Normal color, tone, and turgor  NEUROLOGIC: Cranial nerves II-XII grossly intact  MUSCULOSKELETAL: Normal gross motion of lower extremities    EXTREMITY:     Right Hand    Thumb: []  Crepitus, []  A1 Tender, []  A1 Nodule, []  A1 Ganglion, []  Triggering, []  No Interphalangeal joint hyperextension, []  No Interphalangeal joint motion    Index finger: []  Crepitus, []  A1 Tender, []  A1 Nodule, []  A1 Ganglion, []  Triggering, []  EDC Subluxation, []  Dupuytren's Nodule, []  Extensor Lag PIP, Tip to DPC 0 cm    Middle finger: [x]  Crepitus, []  A1 Tender, []  A1 Nodule, []  A1 Ganglion, [x]  Triggering, []  EDC Subluxation, []  Dupuytren's Nodule, []  Extensor Lag PIP, Tip to DPC 0 cm    Ring finger: []  Crepitus, []  A1 Tender, []  A1 Nodule, []  A1 Ganglion, []  Triggering, []  EDC Subluxation, []  Dupuytren's Nodule, []  Extensor Lag PIP, Tip to DPC 0 cm    Small finger: []  Crepitus, []  A1 Tender, []  A1 Nodule, []  A1 Ganglion, []  Triggering, []  EDC Subluxation, []  Dupuytren's Nodule, []  Extensor Lag PIP, Tip to [facility] 0 cm     Right carpal tunnel exam     []  Tinel's  [x]  Median nerve compression test to the radial 4 digits  [x]  Phalen's  []  Thenar atrophy     Right cubital tunnel exam     []  Tinel's  []  Ulnar nerve subluxation  [x]  Ulnar nerve compression test to the thumb   [x]  Elbow flexion test  [x]  Interosseous atrophy  []  Weakness interosseous muscle  []  Wartenberg's sign  []  Froment sign  []  Hypothenar atrophy  [x]  Normal interosseous function with index finger adduction and middle finger abduction  []  Claw hand deformity     Left Hand    Thumb: []  Crepitus, []  A1 Tender, []  A1 Nodule, []  A1 Ganglion, []  Triggering, []  No Interphalangeal joint hyperextension, []  No Interphalangeal joint motion    Index finger: []  Crepitus, []  A1 Tender, []  A1 Nodule, []  A1 Ganglion, []  Triggering, []  EDC Subluxation, []  Dupuytren's Nodule, []  Extensor Lag PIP, Tip to  DPC 0 cm    Middle finger: []  Crepitus, []  A1 Tender, []  A1 Nodule, []  A1 Ganglion, []  Triggering, []  EDC Subluxation, []  Dupuytren's Nodule, []  Extensor Lag PIP, Tip to DPC 0 cm    Ring finger: [x]  Crepitus, []  A1 Tender, []  A1 Nodule, []  A1 Ganglion, []  Triggering, []  EDC Subluxation, []  Dupuytren's Nodule, []  Extensor Lag PIP, Tip to DPC 0 cm    Small finger: []  Crepitus, []  A1 Tender, []  A1 Nodule, []  A1 Ganglion, []  Triggering, []  EDC Subluxation, []  Dupuytren's Nodule, []  Extensor Lag PIP, Tip to [facility] 0 cm     Left carpal tunnel exam     []  Tinel's  [x]  Median nerve compression test to the entire hand  [x]  Phalen's  []  Thenar atrophy    Left cubital tunnel exam     []  Tinel's  []  Ulnar nerve subluxation  [x]  Ulnar nerve compression test to the thumb  [x]  Elbow flexion test  []  Interosseous atrophy  []  Weakness interosseous muscle  []  Wartenberg's sign  []  Froment sign  []  Hypothenar atrophy  [x]  Normal interosseous function with index finger adduction and middle finger abduction  []  Claw hand deformity     DIAGNOSTIC STUDIES:     AP oblique lateral  view x-rays of the right hand ordered on 01/26/2024 in the office were reviewed with the patient. X-rays reveal     [x]  No acute fractures or dislocations  [x]  Degenerative joint disease trapezial metacarpal joint  [x]  Joint space narrowing trapezial metacarpal joint  [x]  Osteophyte trapezial metacarpal joint  []  Lateral subluxation of the first ray  []  Degenerative joint disease scaphoid trapezial trapezoid joint  []  Degenerative joint disease thumb metacarpophalangeal joint  []  Degenerative joint disease thumb interphalangeal joint    []  Degenerative joint disease metacarpophalangeal joint  [x]  Degenerative joint disease proximal interphalangeal joint  [x]  Degenerative joint disease distal interphalangeal joint  []  Osteophyte     []  Degenerative joint disease radiocarpal joint  []  Degenerative joint disease distal radioulnar joint  []  Ulnar neutral variance  []  Ulnar positive variance  []  Ulnar negative variance    AP oblique lateral  view x-rays of the left hand ordered on 01/26/2024 in the office were reviewed with the patient. X-rays reveal     [x]  No acute fractures or dislocations  [x]  Degenerative joint disease trapezial metacarpal joint  [x]  Joint space narrowing trapezial metacarpal joint  [x]  Osteophyte trapezial metacarpal joint  []  Lateral subluxation of the first ray  []  Degenerative joint disease scaphoid trapezial trapezoid joint  []  Degenerative joint disease thumb metacarpophalangeal joint  []  Degenerative joint disease thumb interphalangeal joint    []  Degenerative joint disease metacarpophalangeal joint  []  Degenerative joint disease proximal interphalangeal joint  []  Degenerative joint disease distal interphalangeal joint  []  Osteophyte     []  Ulnar neutral variance  []  Ulnar positive variance  []  Ulnar negative variance     Nerve conduction test EMG performed by Dr. Charlie Riis on 04/01/2024 was reviewed with the patient. The results are noted below.           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 The  nerve conduction study EMG demonstrates right moderate carpal tunnel syndrome. ***.     FOLLOW UP: {Blank single:19197::''1 Tzzx'',''7 Weeks'',''2 Weeks postop'',''3 Tzzxd'',''8 Month'',''6 Tzzxd'',''7 Months'',''3 Months'',''4 Months'',''6 Months'',''1 Year'',''PRN'',''PRN per patient request'',''Preop  appointment'',''Postop  appointment'',''After  MRI'',''After  CT  scan'',''After STAT MRI'',''After STAT CT scan'',''After  MRI  and  CT  scan'',''After  studies'',''After  nerve  conduction  test  EMG'',''After  authorization  for  cortisone  injection'',''After  authorization  for  US   guided  cortisone  injection'',''After  NCS/EMG  report  obtained'',''After  authorization  for  collagenase  obtained'',''For  Telehealth  appointment''}      I personally performed the services described in this documentation.  All medical record entries made by the scribe were at my direction and in my presence.  I have reviewed the chart and agreed that the record is complete and accurate.        Calhoun Ka, MD  Hand Surgery    CC: Quintella Jacquelyn Ask, MD Quintella Jacquelyn Tajique*

## 2024-05-30 ENCOUNTER — Ambulatory Visit: Payer: Commercial Managed Care - HMO | Attending: Plastic and Reconstructive Surgery

## 2024-05-31 ENCOUNTER — Other Ambulatory Visit: Payer: PRIVATE HEALTH INSURANCE

## 2024-06-03 ENCOUNTER — Other Ambulatory Visit: Payer: Commercial Managed Care - HMO

## 2024-06-04 ENCOUNTER — Other Ambulatory Visit: Payer: PRIVATE HEALTH INSURANCE

## 2024-06-04 ENCOUNTER — Ambulatory Visit: Payer: PRIVATE HEALTH INSURANCE

## 2024-06-05 ENCOUNTER — Telehealth: Payer: PRIVATE HEALTH INSURANCE

## 2024-06-05 NOTE — Telephone Encounter
 Call Back Request      Reason for call back: Pt called requesting to speak to Delon to ask about Hotels in the area to stay prior to his surgery.     Thank you     Any Symptoms:  []  Yes  [x]  No      If yes, what symptoms are you experiencing:    Duration of symptoms (how long):    Have you taken medication for symptoms (OTC or Rx):      If call was taken outside of clinic hours:    [] Patient or caller has been notified that this message was sent outside of normal clinic hours.     [] Patient or caller has been warm transferred to the physician's answering service. If applicable, patient or caller informed to please call us  back if symptoms progress.  Patient or caller has been notified of the turnaround time of 1-2 business day(s).

## 2024-06-07 ENCOUNTER — Other Ambulatory Visit: Payer: PRIVATE HEALTH INSURANCE

## 2024-06-11 ENCOUNTER — Ambulatory Visit: Payer: PRIVATE HEALTH INSURANCE

## 2024-06-11 DIAGNOSIS — M25512 Pain in left shoulder: Principal | ICD-10-CM

## 2024-06-11 NOTE — Progress Notes
 Date: 06/11/2024  Name: Daryl Graves   MRN#: [id number]   DOB: 1951-01-08   Age: 73 y.o.      Oneil L. Tiny, MD  Tita Dutton PA-C      CHIEF COMPLAINT: Left shoulder pain    REFERRING PHYSICIAN: Quintella Jacquelyn Ask, MD    INTERIM HISTORY: The patient is a 73 y.o. year old Male who presents to the office for re-evaluation of his left shoulder. Please recall the patient denies any injury to the left shoulder, however, notes persistent pain for approximately several years. Patient reports undergoing a left shoulder rotator cuff repair with [facility] joint resection with Dr. Ashley in the 1990's. Most recently, he denotes pain down the anterolateral aspect of the left shoulder, and difficulty with sleeping on his left side. Please recall the patient has attempted and failed conservative management consisting of anti-inflammatory medications, multiple cortisone injections, bracing, home exercises, and formal physical therapy for the past 3 years without alleviation of his symptoms.    A corticosteroid injection was administered on 12/20/2023. Patient notes 90% relief of their symptoms. Since last visit, the patient's symptoms have remained unchanged. He continues to have benefit from previous injection. Currently, the patient reports minimal pain. States his symptoms are tolerable. The patient continues to have increased discomfort with heavy lifting, pushing, pulling, and overhead activities.      REVIEW OF SYSTEMS: The 14-point review of systems as documented today in the medical record is remarkable for the positive orthopedic problems discussed above and their relevance was considered with respect to Constitutional, ENT, Cardiovascular, GU, Skin, Neurologic, Endocrine, Hematologic, Psychiatric, Gastrointestinal, Respiratory, Eyes and Allergic/Immunologic systems.    Physical Examination:  There were no vitals taken for this visit.    General: The patient is well-developed, well-nourished, and in no acute distress.  Body habitus is normal.  Patient is oriented ?3, to place, time and person.  Judgment, mood and affect are appropriate.  External exam of eyes, ears, nose and mouth reveal no deformities, scars or lesions.  Respirations are regular and unlabored.  Respiratory effort is normal with no evidence of abnormal intercostal retraction, or excessive use of accessory muscles.  Extraocular movements are intact, pupils are symmetric.  No conjunctivitis or icterus is present.  Skin appears to be normal without rashes, lesions, or ulcers.  Pulses regular.  No cyanosis, clubbing or edema is evident.  Patient has no movement disorder or loss of sensation except as described below.  Gait and station are within normal limits except as described below.  No amputations are fixed deformities are evident.    Left Shoulder:   Inspection: No swelling is noted.  No scars are noted.  No atrophy of surrounding muscle structures was appreciated.  There is no deformity over the A/C joint.    Palpation:   Pain with palpation   []  None [x]  Mild  []  Moderate    []  Severe   anterior aspect of the shoulder.     Range of motion exam:  Forward flexion 170?  Abduction 160?   []   with   []   without pain.    External rotation: 60    Internal rotation: T12      Crepitus is [x]  Present  []  Not present with shoulder range of motion.  Scapular motion is normal.      Manual muscle strength testing:   Supraspinatus (abduction in the scapular plane): 5/5    Pain: []  None [x]  Mild  []  Moderate    []   Severe   Infraspinatus (external rotation, arm at side): 5/5   Pain:  [x]  None []  Mild  []  Moderate    []  Severe   Subscapularis(resisted belly press):  5/5     Pain: [x]  None []  Mild  []  Moderate    []  Severe     Provocative Tests:   Hawkins Test:     []  Positive []  Negative [x]  Not Tested  Cross arm adduction test:  []  Positive []  Negative [x]  Not Tested  Speeds test:   []  Positive []  Negative [x]  Not Tested  O'Brien's test:  []  Positive []  Negative [x]  Not Tested  Apprehension/relocation:    []  Positive []  Negative [x]  Not Tested  Shoulder joint instability was not noted.  Sulcus sign was negative.  Drop arm is not present.    Neurologic: No gross motor or sensory deficits    Vascular: Distal pulses are intact    Skin: No rashes or lesions    X-rays: No new images taken today.Previous images reviewed.     Impression:   1. Left shoulder rotator cuff tendonitis versus recurrent full thickness rotator cuff tear  2. S/P left shoulder rotator cuff repair, decompression and AC joint resection with Dr. Ashley  3. Likely ruptured long head of the biceps, right shoulder  4. Status post right shoulder status post operative arthroscopy, subacromial decompression, distal clavicle excision, rotator cuff repair DOS July 09, 2015, Dr. Emmitt  5. DM II - A1c 5.7    Medical decision making: We had a lengthy discussion with the patient today regarding his condition. I discussed with the patient their physical exam findings, radiographs and imaging, above diagnoses, and current treatment options along with their risks and benefits.     Again, her subjective complaints and objective findings are consistent with rotator cuff tendonitis of the left shoulder. We discussed the treatment options including anti-inflammatory medications, injections, physical therapy, bracing, and surgical interventions. A corticosteroid injection was administered on 12/20/2023. Patient notes 90% relief of their symptoms. Since last visit, the patient reports his symptoms have remained unchanged. He continues to have relief from previous cortisone injection.  Currently the patient states his symptoms are minimal.  Considering continued relief of his symptoms, we will continue with conservative treatment. Our recommendation is the patient continue with a home exercise program for ROM and strengthening exercises.    If patient's symptoms exacerbate, we may consider an MRI arthrogram of the left shoulder to elucidate for a recurrent rotator cuff tear(s) vs repeat CSI.      The patient is instructed to use ice, heat, and OTC pain medications as necessary to control any pain or swelling.  If the patient notes any increased pain or swelling they should contact the office immediately. The patient may continue activities as tolerated.     All of the patient's questions and concerns were addressed.    Follow-up: The patient will follow up in 4-6 months, or sooner if necessary.    The patient was examined and evaluated by Contrell Ballentine PA-C, for Oneil Bigness M.D. The evaluation and plan was reviewed and approved by Oneil Bigness M.D        Shavonda Wiedman PA-C for  Oneil Bigness M.D   Orthopedic Sports Medicine      CC: Quintella Jacquelyn Ask, MD

## 2024-06-17 ENCOUNTER — Ambulatory Visit: Payer: MEDICARE

## 2024-06-17 ENCOUNTER — Ambulatory Visit: Payer: Commercial Managed Care - HMO | Attending: Medical

## 2024-06-19 ENCOUNTER — Ambulatory Visit: Payer: Commercial Managed Care - HMO

## 2024-06-19 ENCOUNTER — Ambulatory Visit: Payer: MEDICARE

## 2024-06-19 LAB — Glucose,POC: GLUCOSE,POC: 165 mg/dL — ABNORMAL HIGH (ref 65–99)

## 2024-06-19 MED ADMIN — PROCHLORPERAZINE EDISYLATE 10 MG/2ML IJ SOLN: INTRAVENOUS | @ 22:00:00 | Stop: 2024-06-19 | NDC 25021079002

## 2024-06-19 MED ADMIN — FENTANYL CITRATE (PF) 50 MCG/ML IJ SOLN (MULTI GPI): 25 ug | INTRAVENOUS | @ 23:00:00 | Stop: 2024-06-20 | NDC 00409909412

## 2024-06-19 MED ADMIN — CEFEPIME 1 G IN SWFI IVP: 1 g | INTRAVENOUS | @ 19:00:00 | Stop: 2024-06-20 | NDC 00409956610

## 2024-06-19 MED ADMIN — PROPOFOL 200 MG/20ML IV EMUL (ANES): INTRAVENOUS | @ 19:00:00 | Stop: 2024-06-19

## 2024-06-19 MED ADMIN — DEXAMETHASONE SODIUM PHOSPHATE 4 MG/ML IJ SOLN: INTRAVENOUS | @ 19:00:00 | Stop: 2024-06-19 | NDC 67457042312

## 2024-06-19 MED ADMIN — PROPOFOL 200 MG/20ML IV EMUL: INTRAVENOUS | @ 19:00:00 | Stop: 2024-06-19 | NDC 63323026929

## 2024-06-19 MED ADMIN — KETOROLAC TROMETHAMINE 30 MG/ML IJ SOLN: @ 20:00:00 | Stop: 2024-06-20 | NDC 25021070101

## 2024-06-19 MED ADMIN — BUPIVACAINE-EPINEPHRINE (PF) 0.5% -1:200000 IJ SOLN: @ 20:00:00 | Stop: 2024-06-20 | NDC 63323046237

## 2024-06-19 MED ADMIN — TRANEXAMIC ACID 1000 MG/10ML IV SOLN: INTRAVENOUS | @ 21:00:00 | Stop: 2024-06-19 | NDC 23155016641

## 2024-06-19 MED ADMIN — BUPIVACAINE IN DEXTROSE 0.75-8.25 % IT SOLN: INTRATHECAL | @ 19:00:00 | Stop: 2024-06-19 | NDC 00409176110

## 2024-06-19 MED ADMIN — VANCOMYCIN 1 GM/200 ML RTU: INTRAVENOUS | @ 19:00:00 | Stop: 2024-06-19 | NDC 00338355248

## 2024-06-19 MED ADMIN — ACETAMINOPHEN 10 MG/ML IV SOLN: INTRAVENOUS | @ 21:00:00 | Stop: 2024-06-19 | NDC 63323043400

## 2024-06-19 MED ADMIN — HYDROGEN PEROXIDE 3 % EX SOLN: @ 20:00:00 | Stop: 2024-06-20

## 2024-06-19 MED ADMIN — ONDANSETRON HCL 4 MG/2ML IJ SOLN: INTRAVENOUS | @ 22:00:00 | Stop: 2024-06-19 | NDC 60505613005

## 2024-06-19 MED ADMIN — HYDROMORPHONE HCL 1 MG/ML IJ SOLN: .3 mg | INTRAVENOUS | @ 23:00:00 | Stop: 2024-06-20 | NDC 00409426411

## 2024-06-19 MED ADMIN — LACTATED RINGERS IV SOLN: INTRAVENOUS | @ 19:00:00 | Stop: 2024-06-19 | NDC 00338011704

## 2024-06-19 MED ADMIN — POVIDONE-IODINE 10 % EX SOLN: @ 20:00:00 | Stop: 2024-06-20 | NDC 00904110309

## 2024-06-19 MED ADMIN — MIDAZOLAM HCL 10 MG/10ML IJ SOLN: INTRAVENOUS | @ 19:00:00 | Stop: 2024-06-19 | NDC 00409258705

## 2024-06-19 MED ADMIN — VANCOMYCIN HCL 1000 MG TOPICAL: @ 20:00:00 | Stop: 2024-06-20

## 2024-06-19 MED ADMIN — ROPIVACAINE HCL 5 MG/ML IJ SOLN: PERINEURAL | @ 22:00:00 | Stop: 2024-06-19 | NDC 70069006401

## 2024-06-19 MED ADMIN — PROPOFOL 200 MG/20ML IV EMUL (ANES): INTRAVENOUS | @ 22:00:00 | Stop: 2024-06-19

## 2024-06-19 NOTE — H&P
 UPDATED H&P REQUIREMENT    WHAT IS THE STATUS OF THE PATIENT'S MOST CURRENT HISTORY AND PHYSICAL?   - The most current H&P is >24 hours and <30 days, and having examined the patient, I attest that there have been no changes. (This suffices as an update to the H&P).      REFER TO MEDICAL STAFF POLICIES REGARDING PRE-PROCEDURE HISTORY AND PHYSICAL EXAMINATION AND UPDATED H&P REQUIREMENTS BELOW:    Tanda Corrente [facility] Medical Staff Policy 200 - For Patients Undergoing Procedures Requiring Moderate or Deep Sedation, General Anesthesia or Regional Anesthesia    Contents of a History and Physical Examination (H&P):    The H&P shall consist of chief complaint, history of present illness, allergies and medications, relevant social and family history, past medical history, review of systems and physical examination, and assessment and plan appropriate to the patient's age.    For Patients Undergoing Procedures Requiring Moderate or Deep Sedation, General Anesthesia or Regional Anesthesia:    1. An H&P shall be performed within 24 hours prior to the procedure by a qualified member of the medical staff or designee with appropriate privileges, except as noted in item 2 below.    2. If a complete history and physical was performed within thirty (30) calendar days prior to the patient?s admission to the Medical Center for elective surgery, a member of the medical staff assumes the responsibility for the accuracy of the clinical information and will need to document in the medical record within twenty-four (24) hours of admission and prior to surgery or major invasive procedure, that they either attest that the history and physical has been reviewed and accepted, or document an update of the original history and physical relevant to the patient's current clinical status.    3. Providing an H&P for patients undergoing surgery under local anesthesia is at the discretion of the Attending Physician.     4. When a procedure is performed by a dentist, podiatrist or other practitioner who is not privileged to perform an H&P, the anesthesiologist?s assessment immediately prior to the procedure will constitute the 24 hour re-assessment.The dentist, podiatrist or other practitioner who is not privileged to perform an H&P will document the history and physical relevant to the procedure.    5. If the H&P and the written informed consent for the surgery or procedure are not recorded in the patient's medical record prior to surgery, the operation shall not be performed unless the attending physician states in writing that such a delay could lead to an adverse event or irreversible damage to the patient.    6. The above requirements shall not preclude the rendering of emergency medical or surgical care to a patient in dire circumstances.

## 2024-06-19 NOTE — Op Note
 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 After adequate IV sedation was obtained, the patient's leg was prepped and draped in the usual sterile fashion. The previous incision was utilized and extended proximally and distally with any of the draining sinuses  being ellipsed out. Deep tissue planes were then raised medial and laterally, and a standard medial parapatellar arthrotomy was performed. Significant purulent material was expressed from the knee at this point, approximately 50 cc.    At this point, then, the proximal tibia was skeletonized. The patella was everted and multiple soft tissue cultures, 6 in total, were then taken. Antibiotics were given at this point. Please note, then, that the patella was everted, and the poly component removed, along with any cement  .    A complete capsulectomy was then performed using the Bovie electrocautery. This tissue was necrotic, nonviable, present upon admission. Total volume was approximately 10 x 10 x 10 cm.      At this point, then, the femoral component was seen to be paritally loose as well as the tibial component. These were then easily expacted after the bone cement interface was disrupted with a reciprocating saw,  and  the proximal tibia and distal femur and passed off the field.    The remaining portion of the posterior capsule was completely removed using the Bovie electrocautery until healthy viable tissue was obtained medially, the vastus medialis and laterally as well, the femoral component proximally, the posterior capsule and the knee joint itself.    Once this was then done, using the bur the patella was then burred out until healthy viable bone was seen.  Attention was drawn to the distal femur and proximal tibia, and both of them were curetted out and burred out until all healthy bone remained. The cement pedicles were removed in both cases.    A significant amount of fibrotic material was then removed at this point also and passed off the field. A burr was then used on the distal femur and proximal tibia to remove any nonviable bone until healthy bleeding bone was obtained.    At this point Hydrogen peroxide was then placed in the leg, approximately 100 cc, and let incubate for 3 minutes   Reverse curettes were used in the canal to remove any fibrotic material. Burs were then used on the distal femur and proximal tibia to remove any non-healthy-looking bone. Once this was done, hydrogen peroxide was then again added to the joint, .  This was evacuated, and 200 c of betadine was then placed in the joint for 3 minutes,  evacuated,  and then  irrigated with 3 L of duobiotic solution; . At this point, then, gowns, gloves and drapes were changed to the field as well as a new stockinette.    The hydrogen peroxide was then removed. The leg was irrigated with approximately 150 cc of Betadine. This was then set for 5 minutes. This was removed, and the leg was irrigated again with another 3 L of saline, hydrogen peroxide, Betadine and then again another 3 L of saline, hydrogen peroxide and Betadine.   Once this was all completed all remaining tissues examined and noted to be either healthy/bleeding and/or contractile. Cement was then mixed; approximately 4 bags were mixed of antibiotic high-viscosity cement along with 8 g of vancomycin, and at this point articulating cement antibiotic spacers were made for the proximal tibia and distal femur. These were let to harden in place loosely to the bone so as not to adhere to it. Once cement was fully hardened, the deep capsule was closed using multiple #1 Monocryl sutures. The skin was then closed using multiple nylon sutures 2-0 and vertical mattresses. The total length of the incision was approximately 30 cm.   At the end of the case, all sponge, instrument and needle counts were correct. The patient tolerated the procedure well.   Please note, second generation cephalosporin antibiotics were not given prior to incision due to the presence of infection. Routine stop orders will not be given after 24 hours due to the presence of infection. DVT prophylaxis maintained consists of foot pumps during surgery.       At the end of the case all sponge instrument needle counts were correct with the patient tolerated the procedure well and was taken awake and alert to recovery in good condition.  I the attending was present throughout the entire case and did perform the case.      Please note 2nd generation cephalosporin antibiotics were given within 1 hour of cut time prior to incision no further antibiotics were given.  Patient was maintained on DVT prophylaxis consisting of foot pumps during the case.    Estimated blood loss for the procedure 350    Complications none.      Specimens multiple

## 2024-06-19 NOTE — Op Note
 ----------------------------------------------------------------------------------------------------------------------  (THE FOLLOWING IS FOR NURSING REFERENCE AND HAS BEEN PULLED FROM THE CURRENT CHART. 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]  Failed total knee arthroplasty, subsequent encounter [U15.981I, Z96.659]  Treatment Team         Provider   Role Specialty Contact     Parmekar, Ollis SAUNDERS., MD      1st Provider Contact Internal Medicine  [id number]   [phone]       Maurene Gwenn SQUIBB., MD      Attending Orthopaedic Surgery  [phone]   [phone]       Maire Lauraine Gunner, OT      Occupational Therapist -- --     Joints, Orthopaedics -      Team --  [phone]   [phone]       Waymond Prentice Quarry, PT      Physical Therapist -- --     Debby Lauraine Eulalio Mariel, PT      Physical Therapist --  [phone]              __________________________________________________________________________________  Anesthesia Procedures  AN SPINAL ORDERABLE [place]  INJECT ANES AGENT AND/OR STERIOID;FEMORAL NERVE W/IMAGE  AN PERI ORDERABLE [place]  __________________________________________________________________________________    History  Past Medical History:   Diagnosis Date    Arthritis     Bone spur of femur     right    Coccidioidomycosis     Diabetes mellitus (HCC/RAF)     Hyperlipidemia     Peripheral vascular disease      Past Surgical History:   Procedure Laterality Date    bilateral knee replacements      lL2009 R2010    bilateral thumb sx      EYE SURGERY Left     left knee sx      left shoulder sx      copolla    right shoulder sx      with hamilton    VASECTOMY       __________________________________________________________________________________    Labs  Recent Labs     06/19/24  1450   GLUCOSEPOC 165* __________________________________________________________________________________    Vital Signs  Last Recorded Vital Signs:    06/19/24 1645   BP: 127/59   Pulse: 80   Resp: 17   Temp:    SpO2: 94%     @LASTETCO2 @  Temp Readings from Last 1 Encounters:   06/19/24 36.7 ?C (98 ?F) (Temporal)       Pain Information (Last Filed)       Score Location Comments Edu?      3 None None None          __________________________________________________________________________________    Intake and Output  No intake/output data recorded.  I/O this shift:  In: 1220 [P.O.:220; I.V.:800; Other:200]  Out: 995 [Urine:600; Drains:45; Blood:350]     __________________________________________________________________________________    IV Drips/Fluids/PCA:    0.9% NaCl/KCl 20 mEq       __________________________________________________________________________________    Lines, Drains, and Airways  Peripheral IV 20 G Anterior;Right Wrist (Active)       Negative Pressure Wound Therapy Thigh (Active)       Surgical Drain Anterior;Left Knee Bulb (Active)     __________________________________________________________________________________    ----------------------------------------------------------------------------------------------------------------------      PACU NursingTransfer Note  5:08 PM, 06/19/2024  Isolation? No    Antibiotics in OR:  Last Antibiotic (last 24 hours) Showing orders from other encounters      Date/Time Action Medication Dose    06/19/24 1242 Given   [MIXED IN CEMENT]    vancomycin topical powder 8 g    06/19/24 1208 New Bag/ Syringe/ Cartridge    cefepime 1 g in sterile water PF 10.5 mL IV injection 1 g    06/19/24 1142 Given    vancomycin 1 g in dextrose 200 mL IVPB RTU 1 g          Last Antiemetic:   Med Administrations and Associated Flowsheet Values (last 4 hours) Showing orders from other encounters      Date/Time Action Medication Dose    06/19/24 1430 Given    ondansetron 4 mg/2 mL inj 4 mg 06/19/24 1427 Given    prochlorperazine 10 mg/2 mL inj 10 mg          Acetaminophen  given @:  Med Administrations and Associated Flowsheet Values (last 24 hours) Showing orders from other encounters      Date/Time Action Medication Dose    06/19/24 1358 Given    acetaminophen  IV inj 1,000 mg          Last pain medication:   Pain Meds (last 4 hours) Showing orders from other encounters      Date/Time Action Medication Dose    06/19/24 1551 Given    fentaNYL (PF) 50 mcg/mL inj 25 mcg 25 mcg    06/19/24 1541 Given    HYDROmorphone 1 mg/mL inj 0.3 mg 0.3 mg    06/19/24 1437 Given    ROPivacaine 0.5% inj 20 mL    06/19/24 1358 Given    acetaminophen  IV inj 1,000 mg          Txp Anti-rejection medication:  Anti-rejection meds. (last 24 hours) Showing orders from other encounters      Date/Time Action Medication Dose    06/19/24 1153 Given    dexAMETHasone 4 mg/mL inj 8 mg            Does the patient use prescription pain medication at home (prior to admission)?SABRASABRASABRAUnknown    Pain level: Acceptable for patient? Yes    Difficult Airway? No    Respiratory is at baseline? Yes    Has the patient received Flumazenil or Narcan in PACU? No  (if ''Yes'', must be at least 45 minutes prior to transfer)     Aldrete: 10    Level of Consciousness:  Awake, Alert, Oriented x four    Cardiac Rhythm?  Normal Sinus    Surgical Site: Intact and Dry or with Minimal Drainage  Lines, Drains, and Airways       None                  Diet: Clear Liquids    Tolerating liquids: Yes    Activity: MAE: Has not ambulated    Voided:  straight cath     Significant Other Location:      Will Transport to: Floor                       With: RN    Continuity of Care Issues from OR or PACU:   None

## 2024-06-19 NOTE — Consults
 Patient Name: Daryl Graves MRN: [id number]   Age: 73 y.o.  Date of Birth: May 06, 1951   Date of Service: 06/19/2024     Requesting Consultant:    Reason for Consult: Medical Co-Management    Chief Complaint: Lt knee pain    HPI:  Patient is a 73 y.o. 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 Cyran today   . I have been consulted postop cor medical comanagement. Currently other than left knee pain, pt has no other complaints.    Past Medical History:  Past Medical History:   Diagnosis Date    Arthritis     Bone spur of femur     right    BPH (benign prostatic hyperplasia)     Coccidioidomycosis     Diabetes mellitus (HCC/RAF)     Hyperlipidemia     Ulcerative colitis (HCC/RAF)         Past Surgical History:  Past Surgical History:   Procedure Laterality Date    bilateral knee replacements      lL2009 R2010    bilateral thumb sx      CATARACT EXTRACTION Left     HIP SURGERY Right     for bone spur    left knee sx Left     for ganglion cyst    left shoulder sx      copolla    right shoulder sx      with hamilton    VASECTOMY          Social History:  Social History     Tobacco Use    Smoking status: Never    Smokeless tobacco: Never   Substance and Sexual Activity    Alcohol use: Not Currently    Drug use: Never        Allergies:  Allergies   Allergen Reactions    Ibuprofen Other (See Comments)     Sores in mouth        Home Medications:  Prior to Admission medications   Medication Sig Start Date End Date Taking? Authorizing Provider   azaTHIOprine 50 mg tablet TAKE TWO TABLETS BY MOUTH EVERY DAY 12/02/20  Yes [provider]   empagliflozin (JARDIANCE) 10 mg tablet Take 1 tablet (10 mg total) by mouth every morning.   Yes PROVIDER, HISTORICAL   LUMIGAN 0.01 % ophthalmic solution Place 1 drop into the right eye every other day. 01/11/21  Yes PROVIDER, HISTORICAL   mesalamine (LIALDA) 1.2 g DR tablet TAKE 4 TABLETS BY MOUTH DAILY 12/14/20  Yes [provider]   ALPRAZolam  0.5 mg tablet Take one pill one hour before MRI. If needed may have another. Do not drive. Have driver arranged 1/71/74   Saied, Fadi, DO   ASHWAGANDHA PO Take 1,000 capsules by mouth daily.    PROVIDER, HISTORICAL   aspirin 81 mg EC tablet Take 1 tablet (81 mg total) by mouth daily.    PROVIDER, HISTORICAL   atorvastatin 10 mg tablet TAKE 1 TABLET BY MOUTH AT BEDTIME 11/30/20   [provider]   CELECOXIB  200 mg capsule TAKE 1 CAPSULE BY MOUTH TWO TIMES DAILY. 05/01/23   Todd, Landon D., PA-C   Cetirizine HCl (ZYRTEC ALLERGY PO) Take 1 tablet by mouth daily.    PROVIDER, HISTORICAL   Cholecalciferol (VITAMIN D) 50 mcg (2000 units) CAPS Take 1 capsule (50 mcg total)  by mouth daily.    PROVIDER, HISTORICAL   CINNAMON PO Take 2,000 mg by mouth daily.    PROVIDER, HISTORICAL   Coenzyme Q10 (COQ-10 PO) Take 1 tablet by mouth daily.    PROVIDER, HISTORICAL   cyanocobalamin 1000 mcg tablet Take 5 tablets (5,000 mcg total) by mouth daily.    PROVIDER, HISTORICAL   doxycycline  hyclate 100 mg tablet Take 1 tablet (100 mg total) by mouth two (2) times daily. 05/22/24   Todd, Landon D., PA-C   finasteride 5 mg tablet TAKE 1 TABLET BY MOUTH EVERY DAY 12/02/20   [provider]   fish oil 1000 mg capsule Take 1 capsule (1 g total) by mouth three (3) times daily.    PROVIDER, HISTORICAL   magnesium oxide 400 mg tablet Take 1 tablet (400 mg total) by mouth every other day.    PROVIDER, HISTORICAL   Multiple Vitamins-Minerals (PRESERVISION AREDS 2 PO) Take 2 tablets by mouth daily.    PROVIDER, HISTORICAL   multivitamin tablet Take 1 tablet by mouth daily.    PROVIDER, HISTORICAL   Semaglutide (OZEMPIC, 2 MG/DOSE, SC) Inject 2 mg under the skin once a week.    PROVIDER, HISTORICAL   tamsulosin 0.4 mg capsule TAKE ONE CAPSULE BY MOUTH TWO TIMES A DAY 11/02/20   [provider]   Zinc 50 MG TABS Take 50 mg by mouth every other day.    PROVIDER, HISTORICAL       Ambulatory Status: Walker    Review Of Systems:  Ophthalmic ROS: negative for - blurry vision or double vision  ENT ROS: negative for - epistaxis or nasal discharge  Endocrine ROS: negative for - polydipsia/polyuria or temperature intolerance  Respiratory ROS: negative for - cough, hemoptysis, or shortness of breath  Cardiovascular ROS: negative for - chest pain, orthopnea, PND  Gastrointestinal ROS: negative for - abdominal pain or nausea/vomiting  Musculoskeleta: Lt knee pain  Neurological ROS: negative for - headaches, seizures, or focal weakness       Physical Examination:    Vital Signs: BP 127/59  ~ Pulse 78  ~ Temp 36.7 ?C (98 ?F) (Temporal)  ~ Resp 13  ~ Ht 1.854 m (6' 1'')  ~ Wt (!) 106.8 kg (235 lb 6.4 oz)  ~ SpO2 93%  ~ BMI 31.06 kg/m?     I/Os:   Intake/Output Summary (Last 24 hours) at 06/19/2024 1725  Last data filed at 06/19/2024 1638  Gross per 24 hour   Intake 1220 ml   Output 995 ml   Net 225 ml        General appearance: Alert, oriented, no acute distress    HEENT: Normal, Atraumatic, normocephalic, PERRLA     Neck:   no carotid bruit and no JVD    Cardiovascular: Regular rate and rhythm and no murmurs     Lungs:   clear to auscultation bilaterally    Abdomen: abdomen is soft without significant tenderness, masses, organomegaly or guarding    Extremities:   No pedal edema  Dressing Lt knee     Musculoskeletal:  Decreased ROM left knee    Neuro/CNS:   AAOx3, Cranial nerves II to XII WNLs, No motor defeciets, Sensory exam normal      Assessment:  #Prosthetic joint infection Lt knee  #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

## 2024-06-19 NOTE — Progress Notes
 06/19/24 1559   Time Calculation   Patient not seen due to Clarifying orders  (No PT eval and treat orders as of 1600. Check tomorrow.)   Chart accessed for Treatment scheduling or assignment     Prentice Cheadle, PT

## 2024-06-19 NOTE — Progress Notes
 06/19/24 1605   Time Calculation   Patient not seen due to Clarifying orders  (No OT order for eval as of 16:05. Will check tomorrow.)

## 2024-06-20 ENCOUNTER — Other Ambulatory Visit: Payer: PRIVATE HEALTH INSURANCE

## 2024-06-20 LAB — Glucose,POC
GLUCOSE,POC: 172 mg/dL — ABNORMAL HIGH (ref 65–99)
GLUCOSE,POC: 210 mg/dL — ABNORMAL HIGH (ref 65–99)
GLUCOSE,POC: 141 mg/dL — ABNORMAL HIGH (ref 65–99)

## 2024-06-20 LAB — Bacterial Culture-Gm Stain
GRAM STAIN (GENERAL): NONE SEEN
GRAM STAIN (GENERAL): NONE SEEN
GRAM STAIN (GENERAL): NONE SEEN
GRAM STAIN (GENERAL): NONE SEEN
GRAM STAIN (GENERAL): NONE SEEN

## 2024-06-20 LAB — Differential Automated: ABSOLUTE BASO COUNT: <0.03 x10E3/uL (ref 0.00–0.10)

## 2024-06-20 LAB — Fungal Culture
FUNGAL CULTURE: NEGATIVE
FUNGAL CULTURE: NEGATIVE
FUNGAL CULTURE: NEGATIVE
FUNGAL CULTURE: NEGATIVE

## 2024-06-20 LAB — Albumin: ALBUMIN: 2.7 g/dL — ABNORMAL LOW (ref 3.4–5.0)

## 2024-06-20 LAB — Basic Metabolic Panel: CHLORIDE: 107 mmol/L (ref 98–107)

## 2024-06-20 LAB — C-Reactive Protein: C-REACTIVE PROTEIN: 5.2 mg/dL — ABNORMAL HIGH (ref <0.3)

## 2024-06-20 LAB — Sedimentation Rate, Erythrocyte: SEDIMENTATION RATE, ERYTHROCYTE: 32 mm/h — ABNORMAL HIGH (ref <=12)

## 2024-06-20 LAB — CBC: MEAN CORPUSCULAR VOLUME: 89.3 fL (ref 79.3–98.6)

## 2024-06-20 MED ADMIN — HYDROMORPHONE HCL 1 MG/ML IJ SOLN: .5 mg | INTRAVENOUS | @ 18:00:00 | Stop: 2024-06-25 | NDC 00409426411

## 2024-06-20 MED ADMIN — SODIUM CHLORIDE 0.9 %/20 MEQ KCL: 125 mL/h | INTRAVENOUS | @ 17:00:00 | Stop: 2024-06-20

## 2024-06-20 MED ADMIN — OXYCODONE HCL 5 MG PO TABS: 10 mg | ORAL | @ 16:00:00 | Stop: 2024-06-25 | NDC 00406055262

## 2024-06-20 MED ADMIN — CEFEPIME 2 G IN SWFI IVP: 2 g | INTRAVENOUS | @ 01:00:00 | Stop: 2024-06-20 | NDC 00409973501

## 2024-06-20 MED ADMIN — DOCUSATE SODIUM 100 MG PO CAPS: 100 mg | ORAL | @ 16:00:00 | Stop: 2024-06-25 | NDC 00904718361

## 2024-06-20 MED ADMIN — INSULIN ASPART 100 UNIT/ML IJ SOLN: 10 mL | SUBCUTANEOUS | @ 02:00:00 | Stop: 2024-06-25 | NDC 00169750111

## 2024-06-20 MED ADMIN — PNEUMOCOCCAL 20-VAL CONJ VACC 0.5 ML IM SUSY: .5 mL | INTRAMUSCULAR | Stop: 2024-06-25

## 2024-06-20 MED ADMIN — OXYCODONE HCL 5 MG PO TABS: 5 mg | ORAL | @ 08:00:00 | Stop: 2024-06-25 | NDC 00406055262

## 2024-06-20 MED ADMIN — VANCOMYCIN 1 G IN NS IVPB MB: 1 g | INTRAVENOUS | @ 07:00:00 | Stop: 2024-06-20 | NDC 67457034001

## 2024-06-20 MED ADMIN — DOCUSATE SODIUM 100 MG PO CAPS: 100 mg | ORAL | @ 04:00:00 | Stop: 2024-06-25 | NDC 00904728080

## 2024-06-20 MED ADMIN — HYDROMORPHONE HCL 1 MG/ML IJ SOLN: .5 mg | INTRAVENOUS | @ 02:00:00 | Stop: 2024-06-25 | NDC 00409426411

## 2024-06-20 MED ADMIN — SODIUM CHLORIDE 0.9 %/20 MEQ KCL: 125 mL/h | INTRAVENOUS | @ 11:00:00 | Stop: 2024-06-20 | NDC 00338069104

## 2024-06-20 MED ADMIN — INSULIN ASPART 100 UNIT/ML IJ SOLN: 10 mL | SUBCUTANEOUS | @ 04:00:00 | Stop: 2024-06-25 | NDC 00169750111

## 2024-06-20 MED ADMIN — OXYCODONE HCL 5 MG PO TABS: 10 mg | ORAL | @ 01:00:00 | Stop: 2024-06-25 | NDC 00406055262

## 2024-06-20 MED ADMIN — SODIUM CHLORIDE 0.9 %/20 MEQ KCL: 125 mL/h | INTRAVENOUS | @ 01:00:00 | Stop: 2024-06-20 | NDC 00338069104

## 2024-06-20 MED ADMIN — CEFEPIME 2 G IN SWFI IVP: 2 g | INTRAVENOUS | @ 16:00:00 | Stop: 2024-06-20 | NDC 00409973501

## 2024-06-20 MED ADMIN — CEFEPIME 2 G IN SWFI IVP: 2 g | INTRAVENOUS | @ 09:00:00 | Stop: 2024-06-20 | NDC 00409973501

## 2024-06-20 NOTE — Progress Notes
 Pharmaceutical Services - Vancomycin Dosing (Initial)    Patient Name: Daryl Graves  MRN: [id number]  Ht 1.854 m (6' 1'')  ~ Wt (!) 106.8 kg  ~ BMI 31.06 kg/m?      Vancomycin per Pharmacy Consult Order (From admission, onward)       Start     Ordered    06/20/24 1639  vancomycin per pharmacy  Per Protocol        Question Answer Comment   Indication Bone and/or Joint Infection    Has patient received a dose of this drug within the past 72 hours? No    Is patient ESRD on HD? No        06/20/24 1640                    Vancomycin Administration  Med Administrations and Associated Flowsheet Values (last 24 hours)  Vancomycin administration      Date/Time Action Medication Dose Rate    06/19/24 2350 New Bag/ Syringe/ Cartridge    vancomycin 1 g in sodium chloride 250 mL IVPB MB 1 g 250 mL/hr            No results found for: ''VANCOTROUGH'', ''VANCORAND''    Recent Labs   Lab 06/20/24  0608   WBC 6.73   BUN 18   CREAT 0.60*       Microbiology Data  Recent Results (from the past week)   Fungal Culture, Tissue    Collection Time: 06/19/24  1:46 PM    Specimen: Knee, Left; Tissue   Result Value Ref Range    Fungal Culture Negative To Date    Bacterial Culture-Gm Stain, Tissue    Collection Time: 06/19/24  1:46 PM    Specimen: Knee, Left; Tissue   Result Value Ref Range    Bacterial Aerobic Culture Rare Staphylococcus lugdunensis (AA)     Gram Stain No bacteria seen.    Fungal Culture, Tissue    Collection Time: 06/19/24  1:47 PM    Specimen: Knee, Left; Tissue   Result Value Ref Range    Fungal Culture Negative To Date    Bacterial Culture-Gm Stain, Tissue    Collection Time: 06/19/24  1:47 PM    Specimen: Knee, Left; Tissue   Result Value Ref Range    Bacterial Aerobic Culture Negative To Date     Gram Stain No bacteria seen.     Gram Stain Few WBC    Fungal Culture, Tissue    Collection Time: 06/19/24  1:47 PM    Specimen: Knee, Left; Tissue   Result Value Ref Range    Fungal Culture Negative To Date    Bacterial Culture-Gm Stain, Tissue    Collection Time: 06/19/24  1:47 PM    Specimen: Knee, Left; Tissue   Result Value Ref Range    Bacterial Aerobic Culture Negative To Date     Gram Stain No bacteria seen.    Fungal Culture, Tissue    Collection Time: 06/19/24  1:47 PM    Specimen: Knee, Left; Tissue   Result Value Ref Range    Fungal Culture Negative To Date    Bacterial Culture-Gm Stain, Tissue    Collection Time: 06/19/24  1:47 PM    Specimen: Knee, Left; Tissue   Result Value Ref Range    Bacterial Aerobic Culture Negative To Date     Gram Stain No bacteria seen.        MRSA Nares: N/A  Assessment    For stable renal function goal AUC = 400-600; Trough-based monitoring will be conducted for patients with unstable renal function, intermittent hemodialysis, and ECMO.     Indication: Bone and/or Joint Infection  Goal: AUC 400-600 mg*hr/L  PK Parameters: Vd 63.45 L, T1/2 7.2 hours, CL 6.11 L/hr  This patient is receiving renal replacement therapy: None  This patient is receiving ECMO: No    Received 1000mg  on 06/19/24 @ 1142 and @ 2350    Plan  Give loading dose vancomycin 2000mg  IV x1 now. Then start vancomycin 1000 mg IV Q8h.   On this regimen, estimated 24-hour AUC is 521 mg*h/L. This corresponds to an estimated trough of 11 mcg/mL predicted by [employer].    Pharmacy will continue to monitor the patient's clinical progress. The next vancomycin level is scheduled on 06/21/24 at 1700.      Rybak MJ, Ladora PARAS, Lodise TP, et al. Therapeutic monitoring of vancomycin for serious methicillin-resistant staphylococcus aureus infections: a revised consensus guideline and review by the ASHP, IDSA, PIDS, and SIDP. Amer J of Health-System Pharm. 2020;77(11):835-864.    Corean Caldron Krista Shams, PharmD, 06/20/2024, 5:15 PM

## 2024-06-20 NOTE — Consults
 TITLE:  INFECTIOUS DISEASE CONSULTATION.    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    HISTORY OF PRESENT ILLNESS:  This is a 73 year old gentleman with past medical history of diabetes, hypertension, hyperlipidemia, history of ulcerative colitis, benign prostatic hyperplasia, history of pulmonary coccidioidomycosis, degenerative joint disease, status post bilateral total knee replacement. Currently with evidence of left knee pain with effusion. During course of hospital stay, the patient remained afebrile but hypotensive, responded to IV fluids. The patient also with no evidence of leukocytosis, but found to have evidence of anemia. The patient did undergo arthrocentesis of the left knee and he did have a history of septic arthritis with draining infected sinus that failed oral antibiotic as outpatient. The patient currently postop status post stage I revision of the left knee with extraction of infected hardware and placement of antibiotic cement as well as a JP drain placement. Postop course has been uncomplicated. The patient was initially started on IV vancomycin and cefepime and currently patient on IV cefepime. Infectious Disease consulted for further management. Surgical culture came back positive for Staph lugdunensis. Patient denies any other symptoms.    PAST MEDICAL HISTORY:  Diabetes, hypertension, hyperlipidemia, history of coccidial mycosis pulmonary, degenerate joint disease, status post bilateral total knee replacement. History of ulcerative colitis.    PAST SURGICAL HISTORY:  History of bilateral total knee replacement for osteoarthritis, history of cataract extraction, hip surgery.    SOCIAL HISTORY:  Denies alcohol, tobacco.    FAMILY HISTORY:  Noncontributory.    ALLERGIES:  No known drug allergies.    MEDICATION:  IV cefepime.    VITALS:  Temperature of 36.5, has remained afebrile, heart rate of 70, blood pressure is 83/54, repeat blood pressure is 147/65, O2 saturation 98% on room air.    PHYSICAL EXAMINATION:  General: Awake, alert, not in acute distress. HEENT: PERRLA. Cardiovascular: Regular rate and S1, S2. No murmur. Chest: Clear to auscultation. Abdomen: Soft. Extremities: Left knee JP drain in place. Decreased range of motion. Surgical dressing in place.    LABORATORY DATA:  CBC: White blood cell count of 6.73 with no evidence of bandemia. Hemoglobin of 9.8, hematocrit 29.9, with platelets of 160. Chemistry: Sodium 135, potassium 4.3, chloride 107, bicarb of 27, BUN of 18, creatinine 0.6, and glucose of 156 and surgical cultures are consistent with Staph lugdunensis. Repeat cultures are still pending.    IMAGING STUDIES:  On the 15th of September the patient had MRI of the left knee; images are not uploaded. X-ray of the left knee, again, done but imaging is not noted.    DISCUSSION:  This is a 73 year old gentleman with a septic arthritis of the left knee complicated by evidence of prosthetic joint infection, status post stage 1 of incision, debridement of the left knee with extraction of the infected hardware and placement of antibiotic impregnated cement.  Surgical cultures are Staph lugdunensis. Recommend start patient on IV vancomycin pending final culture results and change IV cefepime to IV ceftriaxone 2 g daily pending final susceptibility. Patient to follow up ESR CRP, hydration, supportive therapy.    ASSESSMENT:  1. Septic arthritis of the left knee complicated by evidence of prosthetic joint infection.  2. Failing outpatient oral antibiotic complicated by evidence of left knee fistula.  3. Degenerative joint disease, osteoarthritis.  4. Postoperative status post stage 1 debridement of prostatic knee as well as extraction of the  infected hardware, with antibiotic impregnated cement placement.    PLAN:  1. Recommend to discontinue IV cefepime, start patient on 2 g IV ceftriaxone.  2. Start patient on IV vancomycin pending final culture results.   3. Continue hydration supportive therapy.   4. Follow up ESR, CRP.     Thank you for consultation request. Infectious Disease consult will follow up as necessary. This case was extensively discussed with the patient at bedside as well as the primary team and the nurse.      Cletis Smolder, MD [phone])        MB/MODL CONF#: [id number]  D: 06/20/2024 17:22:36 T: 06/20/2024 18:43:21 DOCUMENT: [id number]

## 2024-06-20 NOTE — Other
 Neuro: Within Defined Limits   BMAT: Level 2 - [place]  Assistive Device: Youth worker (TAP)    Vitals:    Temp:  [36.5 ?C (97.7 ?F)-37 ?C (98.6 ?F)] 37 ?C (98.6 ?F)  Heart Rate:  [70-82] 72  Resp:  [18] 18  BP: (114-147)/(59-75) 124/59  NBP Mean:  [80-94] 80  SpO2:  [90 %-97 %] 96 %  Cardiac:     Ectopy:   Respiratory:           Pain:    Last BM: 06/19/24  GU: Voiding  Skin: Intact except incision  Lines/Tubes/Drains:   Peripheral IV 20 G Anterior;Right Wrist (1)  Negative Pressure Wound Therapy Thigh  Surgical Drain Anterior;Left Knee Bulb (1)         Expected Discharge Disposition: Home w/Home Health     Expected Discharge Date: 06/20/2024   Patient's Clinical Goal:   Clinical Goal(s) for the Shift: VSS  Identify possible barriers to advancing the care plan:   Stability of the patient: Moderately Stable - low risk of patient condition declining or worsening   Progression of Patient's Clinical Goal:   Patient is alert and oriented x4 vitals are stable and afebrile. Patient is toe touch weight baring. IV Vanco given. PRN pain meds given. Dressing is dry and intact. JP draining serosanguineous fluids. He is free from falls and injury throughout shift. All care need met. Continuing with plan of care

## 2024-06-20 NOTE — Other
 Patient's Clinical Goal:   Clinical Goal(s) for the Shift: Ensure adequate pain control; VSS; fall precautions; JP drain care  Identify possible barriers to advancing the care plan:   Stability of the patient: Moderately Stable - low risk of patient condition declining or worsening   Progression of Patient's Clinical Goal:   Patient remained on stable condition. A/O x4; VSS on room air. Pain tolerated with PRN oxycodone. IV fluids continued; IV antibiotics administered as ordered. Surgical wound dressing remained dry and intact; JP drain with sanguinous output noted; total 180 mL overnight. Good urine output noted throughout the shift. Hygiene, repositioning and peri care rendered. Kept comfortable and needs attended. To follow up orders for PT/OT. For continuity of care.

## 2024-06-20 NOTE — Progress Notes
 Patient Name: Daryl Graves MRN: [id number]   Age: 73 y.o.  Date of Birth: Jul 30, 1951   Date of Service: 06/20/2024     Chief Complaint: Lt knee pain    Physical Examination:    Vital Signs: BP 134/73 (Patient Position: Lying)  ~ Pulse 74  ~ Temp 36.9 ?C (98.4 ?F) (Oral)  ~ Resp 18  ~ Ht 1.854 m (6' 1'')  ~ Wt (!) 106.8 kg (235 lb 6.4 oz)  ~ SpO2 94%  ~ BMI 31.06 kg/m?     I/Os:   Intake/Output Summary (Last 24 hours) at 06/20/2024 1137  Last data filed at 06/20/2024 0600  Gross per 24 hour   Intake 2940.83 ml   Output 1990 ml   Net 950.83 ml        General appearance: Alert, oriented, no acute distress  HEENT: Normal, Atraumatic, normocephalic, PERRLA   Neck:   no carotid bruit and no JVD  Cardiovascular: Regular rate and rhythm and no murmurs   Lungs:   clear to auscultation bilaterally  Abdomen: abdomen is soft without significant tenderness, masses, organomegaly or guarding  Extremities:   No pedal edema  Dressing Lt knee  Neuro/CNS:   AAOx3, Cranial nerves II to XII WNLs, No motor defeciets, Sensory exam normal    Current Medications:   Current Facility-Administered Medications   Medication Dose Route Frequency    acetaminophen  tab 650 mg  650 mg Oral Q6H PRN    Or    acetaminophen  650 mg supp 650 mg  650 mg Rectal Q6H PRN    aluminum-magnesium hydroxide-simethicone 400-400-40 mg/5 mL susp 30 mL  30 mL Oral Q6H PRN    bisacodyl EC tab 5 mg  5 mg Oral Daily PRN    calcium gluconate 2 g in sodium chloride 100 mL IVPB RTU  2 g Intravenous PRN    cefepime 2 g in sterile water PF 12.5 mL IV injection  2 g Intravenous Q8H    insulin aspart (NovoLOG) 100 units/mL inj vial   Subcutaneous QAC & QHS PRN    And    dextrose 50% inj 25 g  25 g IV Push PRN    docusate cap 100 mg  100 mg Oral BID    hydrALAZINE tab 25 mg  25 mg Oral Q6H PRN    oxyCODONE tab 5 mg  5 mg Oral Q4H PRN    Or    HYDROmorphone 1 mg/mL inj 0.5 mg  0.5 mg IV Push Q4H PRN    magnesium hydroxide 400 mg/5 mL susp 30 mL  30 mL Oral Daily PRN    magnesium sulfate 2 g in water for injection 50 mL RTU  2 g Intravenous PRN    Or    magnesium sulfate 4 g in water for injection 100 mL RTU  4 g Intravenous PRN    naloxone 0.4 mg/mL inj 0.1 mg  0.1 mg IV Push Q5 min PRN    ondansetron tab 4 mg  4 mg Oral Q8H PRN    Or    ondansetron 4 mg/2 mL inj 4 mg  4 mg Intravenous Q8H PRN    oxyCODONE tab 5 mg  5 mg Oral Q4H PRN    Or    oxyCODONE tab 10 mg  10 mg Oral Q4H PRN    pneumococcal 20-valent vaccine (Prevnar 20) inj 0.5 mL  0.5 mL Intramuscular Once    potassium chloride 10 mEq in water for injection 100 mL IVPB RTU  10 mEq Intravenous PRN    Or    potassium chloride 40 mEq/500 mL IVPB RTU (peripheral) (batched)  40 mEq Intravenous PRN    Or    potassium chloride 30 mEq/500 mL IVPB RTU (peripheral) (batched)  30 mEq Intravenous PRN    Or    potassium chloride 40 mEq/500 mL IVPB RTU (peripheral) (batched)  40 mEq Intravenous PRN    potassium chloride ER tab 20 mEq  20 mEq Oral PRN    Or    potassium chloride ER tab 40 mEq  40 mEq Oral PRN    Or    potassium chloride ER tab 60 mEq  60 mEq Oral PRN    Or    potassium chloride ER tab 80 mEq  80 mEq Oral PRN    rivaroxaban tab 10 mg  10 mg Oral Q24H    senna tab 1 tablet  1 tablet Oral QHS PRN    temazepam cap 7.5 mg  7.5 mg Oral QHS PRN        (click to expand/collapse)   Labs:   Recent Results (from the past week)   Type and screen (Admission On Day Of Surgery: Nurse Protocol)    Collection Time: 06/19/24  9:00 AM   Result Value Ref Range    Antibody Screen Negative     ABORh A Positive    Check type (Admission On Day Of Surgery: Nurse Protocol)    Collection Time: 06/19/24  9:04 AM   Result Value Ref Range    Blood Type Confirmed     ABO/Rh Type A Positive    Fungal Culture, Tissue    Collection Time: 06/19/24  1:46 PM    Specimen: Knee, Left; Tissue   Result Value Ref Range    Fungal Culture Negative To Date    Bacterial Culture-Gm Stain, Tissue    Collection Time: 06/19/24  1:46 PM    Specimen: Knee, Left; Tissue   Result Value Ref Range    Gram Stain No bacteria seen.    Fungal Culture, Tissue    Collection Time: 06/19/24  1:47 PM    Specimen: Knee, Left; Tissue   Result Value Ref Range    Fungal Culture Negative To Date    Bacterial Culture-Gm Stain, Tissue    Collection Time: 06/19/24  1:47 PM    Specimen: Knee, Left; Tissue   Result Value Ref Range    Gram Stain No bacteria seen.     Gram Stain Few WBC    Fungal Culture, Tissue    Collection Time: 06/19/24  1:47 PM    Specimen: Knee, Left; Tissue   Result Value Ref Range    Fungal Culture Negative To Date    Bacterial Culture-Gm Stain, Tissue    Collection Time: 06/19/24  1:47 PM    Specimen: Knee, Left; Tissue   Result Value Ref Range    Gram Stain No bacteria seen.    Fungal Culture, Tissue    Collection Time: 06/19/24  1:47 PM    Specimen: Knee, Left; Tissue   Result Value Ref Range    Fungal Culture Negative To Date    Bacterial Culture-Gm Stain, Tissue    Collection Time: 06/19/24  1:47 PM    Specimen: Knee, Left; Tissue   Result Value Ref Range    Gram Stain No bacteria seen.    POCT glucose    Collection Time: 06/19/24  2:50 PM   Result Value Ref Range    Glucose, Point  of Care 165 (H) 65 - 99 mg/dL   POCT glucose    Collection Time: 06/19/24  6:33 PM   Result Value Ref Range    Glucose, Point of Care 172 (H) 65 - 99 mg/dL   POCT glucose    Collection Time: 06/19/24  9:11 PM   Result Value Ref Range    Glucose, Point of Care 210 (H) 65 - 99 mg/dL   C-reactive protein    Collection Time: 06/20/24  6:08 AM   Result Value Ref Range    C-Reactive Protein 5.2 (H) <0.3 mg/dL   Sedimentation rate, westergren    Collection Time: 06/20/24  6:08 AM   Result Value Ref Range    Sedimentation rate, Erythrocyte 32 (H) <=12 mm/hr   Albumin    Collection Time: 06/20/24  6:08 AM   Result Value Ref Range    Albumin 2.7 (L) 3.4 - 5.0 g/dL   Basic Metabolic Panel    Collection Time: 06/20/24  6:08 AM   Result Value Ref Range    Sodium 137 136 - 145 mmol/L    Potassium 4.0 3.5 - 5.1 mmol/L Chloride 107 98 - 107 mmol/L    Total CO2 27 21 - 32 mmol/L    Anion Gap 3 (L) 8 - 19 mmol/L    Glucose 156 (H) 74 - 106 mg/dL    Creatinine 9.39 (L) 0.70 - 1.30 mg/dL    Estimated GFR >10 See GFR Additional Information mL/min/1.68m2    GFR Additional Information      Urea Nitrogen 18 7 - 18 mg/dL    Calcium 9.3 8.5 - 89.8 mg/dL   CBC    Collection Time: 06/20/24  6:08 AM   Result Value Ref Range    White Blood Cell Count 6.73 4.16 - 9.95 x10E3/uL    Red Blood Cell Count 3.35 (L) 4.41 - 5.95 x10E6/uL    Hemoglobin 9.8 (L) 13.5 - 17.1 g/dL    Hematocrit 70.0 (L) 38.5 - 52.0 %    Mean Corpuscular Volume 89.3 79.3 - 98.6 fL    Mean Corpuscular Hemoglobin 29.3 26.4 - 33.4 pg    MCH Concentration 32.8 31.5 - 35.5 g/dL    Red Cell Distribution Width-SD 46.6 36.9 - 48.3 fL    Red Cell Distribution Width-CV 14.3 11.1 - 15.5 %    Platelet Count, Auto 160 143 - 398 x10E3/uL    Mean Platelet Volume 8.8 (L) 9.3 - 13.0 fL    Nucleated RBC%, automated 0.0 No Ref. Range %    Absolute Nucleated RBC Count 0.00 0.00 - 0.00 x10E3/uL   Differential, Automated    Collection Time: 06/20/24  6:08 AM   Result Value Ref Range    Neutrophil Percent, Auto 71.4 No Ref. Range %    Lymphocyte Percent, Auto 16.8 No Ref. Range %    Monocyte Percent, Auto 11.0 No Ref. Range %    Eosinophil Percent, Auto 0.3 No Ref. Range %    Basophil Percent, Auto 0.1 No Ref. Range %    Immature Granulocytes% 0.4 No Reference Range %    Absolute Neut Count 4.80 1.80 - 6.90 x10E3/uL    Absolute Lymphocyte Count 1.13 (L) 1.30 - 3.40 x10E3/uL    Absolute Mono Count 0.74 0.20 - 0.80 x10E3/uL    Absolute Eos Count <0.03 0.00 - 0.50 x10E3/uL    Absolute Baso Count <0.03 0.00 - 0.10 x10E3/uL    Absolute Immature Gran Count 0.03 0.00 - 0.04 x10E3/uL  POCT glucose    Collection Time: 06/20/24  7:29 AM   Result Value Ref Range    Glucose, Point of Care 141 (H) 65 - 99 mg/dL      Assessment:  #Prosthetic joint infection Lt knee  #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  #DM2  #Hyperlipidemia  #BPH  #Ulcerative colitis      Plan:  POD1  Postop care per ortho: pain control, PT/OT, DVT prophylaxis  Empiric antibiotics in the form of IV vancomycin and cefepime pending intra-op cultures  ID consult pending  Cont to hold jardiance; cover with SSI  Cont atorvastatin  Hold azathioprine; cont mesalamine  PRN hydralazine for adequate BP control  Medically stable; will cont to follow                 Ollis FABIENE Grew, MD

## 2024-06-20 NOTE — Consults
 Physical Therapy Evaluation      PATIENT: Daryl Graves  MRN: [id number]  DOB: Aug 24, 1951    ADMIT DATE: 06/19/2024       Date of Evaluation: 06/20/2024    Problems: Active Problems:    * No active hospital problems. *       Past Medical History:   Diagnosis Date    Arthritis     Bone spur of femur     right    BPH (benign prostatic hyperplasia)     Coccidioidomycosis     Diabetes mellitus (HCC/RAF)     Hyperlipidemia     Ulcerative colitis (HCC/RAF)     Past Surgical History:   Procedure Laterality Date    bilateral knee replacements      [id number]    bilateral thumb sx      CATARACT EXTRACTION Left     HIP SURGERY Right     for bone spur    left knee sx Left     for ganglion cyst    left shoulder sx      [facility]    right shoulder sx      with [facility]    VASECTOMY          Relevant Hospital Course: is a 73 y.o. 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 Cyran. TTWB on LLE, with JP dain.      PT assessment: MD order received, chart reviewed, pt cleared by RN for eval and tx. Pt received in highfowler, spouse present. Provided PLOF and living situation. JP drain on L knee in place, pt aware of WB restriction on LLE (TTWB). 3/10 pain L knee at rest. Resting BP at 105/72mmHg, 96% on RA. Max A to don footwear. CGA to sit EOB, LLE assist. Good sitting balance, denied dizziness, CGA with gown. Seated BP at 111/45mmHg. Pt attempted to stand without AD, non-compliant with WB restriction on LLE. Instructed to sit on the bed, reeducated, FWW provided and pt instructed in UE/LE placement with STS for safety. Min x2 A with STS with FWW, and short distance mobility within the room, compliant with WB restriction throughout. Discussed home setup and recommendations on DME to increase safety in ADLs, pt declined commode. Pt left in comfortable position, call light within reach, all needs met. RN notified of pt status.      To optimize functional outcomes, and provide the most effective care to this patient, co-treatment by both a skilled PT and OT is medically necessary. The patient presents with complex needs that span the distinct, but complementary, domains of each discipline provided at the same time. This includes impairments not limited to mobility, balance, strength, endurance, and functional independence.     Patient Stated Goal: imrpove strength, gait     Living Arrangements   Type of Home: House  Home Layout: One level  Bathroom Shower/Tub: Tub/shower unit, Pension scheme manager: Midwife: None  Bathroom Accessibility: Accessible, Accessible via walker  Home Equipment: Front wheeled walker, [employer]    Prior Level of Function   Level of Independence: Independent  Lives With: Spouse  Support Available: Family  ADL Assistance: Independent  Vocation: Retired  Vision: Within Systems developer  Hearing: Within Education administrator: Drives Self    Precautions   Precautions: Fall risk;Wound (L knee)  Orthotic: None  Current Activity Order:  Activity as tolerated  Weight Bearing Status: Touch Down/Toe Touch Weight Bearing (L LE)    GENERAL EVALUATION   Position: In bed;Family/CG present (wife in the room)  Lines/devices Drains: IV/PICC;Cardiac Monitor;JP Drain/s     Bed Mobility   Supine Scooting: Not Performed  Rolling: Not Performed  Supine to Sit: Contact Guard Assist  Sit to Supine: Contact Guard Assist    Functional Mobility   Sit to Stand: Minimum Assist;Second Person Assist;Assistive Device (Comment) (With FWW L LE TTWB)  Ambulation: Minimum Assist;Second Person Assist  Ambulation Distance (Feet): 10'  Gait Pattern: Decreased pace;Decreased stride length (TTWB LLE)  Assistive Device: Automotive engineer: Not Performed  Stairs: Not Performed     Balance   Sitting - Static: Good  Sitting - Dynamic: Good  Standing - Static: Fair;with UE support  Standing - Dynamic: Fair;with UE support                        UE Assessment   R UE Assessment:  (grossly 3+/5)  L UE Assessment: Within functional limits     LE Assessment   RLE Assessment: Within Functional Limits                 LLE Assessment:  (limtied L knee AROM lacking > 5 degrees of extension; flexion grossly to 60 deg.)                 Sensation   Sensation: Impaired (reports numbness bilateral plantar feet)    Cognition   Cognition: Within Defined Limits  Safety Awareness: Impulsive;Fair awareness of safety precautions (intially impulsive and stands prior to PT instruction> stands with WB on L LE despite education for TTWB and prior verbal confirmation and understanding by pt. however, he does adhere to TTWB prec the rest of the session)  Barriers to Learning: None    Neurological Evaluation (if indicated)   Neuro Deficits: No              Pain Assessment   Patient complains of pain: Yes  Pain Quality: Aching  Pain Scale Used: Numeric Pain Scale  Pain Intensity: 3/10;at rest;7/10;with activity  Pain Location: Left;Knee  Action Taken: Nursing notified;Positioning                                  Patient Status   Activity Tolerance: Fair  Oxygen Needs: Room Air  Response to Treatment: Tolerated treatment well;Vital signs stable;Fatigued;Pain;with activity;Resolved with rest;Nursing notified  Compliance with Precautions: Fair  Call light in reach: Yes  Presentation post treatment: In bed;On cardiac monitor;Lines/drains intact;Family/CG present    Interdisciplinary Communication   Interdisciplinary Communication: Nurse;Occupational Therapist    ASSESSMENT   Rehab Potential: Good  Inpatient Recommendation: PT treatment  Problem List: Decreased bed mobility;Decreased transfers;Decreased gait;Decreased strength;Decreased endurance;Decreased range of motion;Decreased activity tolerance;Impaired balance;Fall risk;Pain limiting function;Decreased knowledge of precautions;Decreased knowledge of exercise program;Need for caregiver/family education;Discharge needs  Present During Evaluation: Limitation of activities due to disability (Z73.6)  Treatment Plan: Bed mobility training;Transfer training;Gait training;Therapeutic exercise;Range of motion;Coordinate with nurse to pre-medicate patient as needed;Balance training;Patient and/or family education;Discharge planning;Education on precautions;Home program;Pre-gait training  Frequency: 5-7 x/week  Visits per day: Daily to BID  Duration (days): 30  Progress Note Due Date: 06/27/24    Goals Discussed With: Patient;Family    Short Term Goals to be achieved in: 7 days  Pt will perform  bed mobility: independently  Pt will perform supine to sit: independently  Pt will transfer to/from bed/chair: independently  Pt will ambulate: 51-100 feet;with FWW;with stand by assist         PT Recommendations   Discharge Recommendation: Physical Therapy;Would benefit from continued therapy;Would benefit from intensive therapy upon discharge;Able to tolerate 3+ hours of therapy per day  Discharge concerns: Requires supervision for mobility;Requires supervision for self care;Requires assistance for mobility;Requires assistance for self care  Discharge Equipment Recommended: Defer to discharge facility;If patient dc home instead of rehab as recommended;Walker;Tub Bench;Commode  Comments: currently recommending high intensity therapy due to patient requiring assist for mobility and ADLs with risk for falls; patient's wife states worry about caring for husband at home; to be further assessed -- may tolerate low intensity therapy with family assist with proper DME and further caregiver training.    AM-PAC   AM-PAC Basic Mobility Raw Score: 17  AM-PAC Basic Mobility t-Scale Score: 39.67  AM-PAC Basic Mobility CMS 0-100% Score: 43.83 %        Evaluation Completed by: Lauraine Eulalio Mariel Debby, PT,  06/20/2024

## 2024-06-20 NOTE — Consults
 Occupational Therapy Evaluation      PATIENT: Daryl Graves  MRN: [id number]  DOB: [birth year]    ADMIT DATE: 06/19/2024       Date of Evaluation: 06/20/2024    Problems: Active Problems:    * No active hospital problems. *       Past Medical History:   Diagnosis Date    Arthritis     Bone spur of femur     right    BPH (benign prostatic hyperplasia)     Coccidioidomycosis     Diabetes mellitus (HCC/RAF)     Hyperlipidemia     Ulcerative colitis (HCC/RAF)     Past Surgical History:   Procedure Laterality Date    bilateral knee replacements      lL2009 R2010    bilateral thumb sx      CATARACT EXTRACTION Left     HIP SURGERY Right     for bone spur    left knee sx Left     for ganglion cyst    left shoulder sx      copolla    right shoulder sx      with hamilton    VASECTOMY          Relevant Hospital Course: is a 73 y.o. 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 Cyran. TTWB on LLE, with JP dain.     OT assessment: MD order received, chart reviewed, pt cleared by RN for eval and tx. Pt received in highfowler, spouse present. Provided PLOF and living situation. JP drain on L knee in place, pt aware of WB restriction on LLE. 3/10 pain L knee at rest. Resting BP at 105/43mmHg, 96% on RA. Max A to don footwear. CGA to sit EOB, LLE assist. Good sitting balance, denied dizziness, CGA with gown. Seated BP at 111/67mmHg. Pt attempted to stand without AD, non-compliant with WB restriction on LLE. Instructed to sit on the bed, reeducated, FWW provided and pt instructed in UE/LE placement with STS for safety. Min x2 A with STS with FWW, and short distance mobility within the room, compliant with WB restriction throughout. Discussed home setup and recommendations on DME to increase safety in ADLs, pt declined commode. Pt left in comfortable position, call light within reach, all needs met. RN notified of pt status.    To optimize functional outcomes, and provide the most effective care to this patient, co-treatment by both a skilled PT and OT is medically necessary. The patient presents with complex needs that span the distinct, but complementary, domains of each discipline provided at the same time. This includes impairments not limited to mobility, balance, strength, endurance, and functional independence.      Patient Stated Goal:  To go home     Living Arrangements   Type of Home: House  Home Layout: One level  Bathroom Shower/Tub: Tub/shower unit, Pension scheme manager: Midwife: None  Home Equipment: Environmental consultant, Biomedical scientist    Prior Level of Function   Level of Independence: Independent  Lives With: Spouse  Support Available: Family  ADL Assistance: Independent  Vocation: Retired  Vision: Within Systems developer  Hearing: Within Education administrator: Drives Self    Precautions   Precautions: Fall risk;Wound (L knee)  Orthotic: None  Current Activity Order: Activity as tolerated  Weight Bearing Status: Touch Down/Toe Touch Weight  Bearing (LLE)    GENERAL EVALUATION   Position: In bed;Bed alarm on;SCD's;Family/CG present  Lines/devices Drains: IV/PICC;Cardiac Monitor;JP Drain/s    Bed Mobility   Rolling: Contact Guard Assist  Supine to Sit: Contact Guard Assist;Assist for LE(s)  Sit to Supine: Contact Guard Assist;Assist for LE(s)    Functional Transfers   Sit to Stand: Minimum Assist;Second Person Assist;Assistive Device (Comment)  Functional Mobility: Front wheeled walker;Minimum Assist    Activities of Daily Living (ADLs)   Eating Assistance: Setup  Eating Where Assessed: Bed level  Grooming Assistance: Setup  Grooming Deficit: Teeth care  Grooming Where Assessed: Bed level  Bathing Assistance: Maximum Assist  UB Dressing Assistance: Contact Guard Assist  LB Dressing Assistance: Maximum Assist  LB Dressing Deficit: Don/doff R sock;Don/doff L sock  Toileting Assistance: Moderate Assist    Balance   Sitting - Static: Good  Sitting - Dynamic: Fair     RUE Assessment   RUE Assessment: Exceptions to [facility]        R Shoulder Flexion: 3+/5  R Shoulder Extension: 3+/5  R Shoulder ABduction: 3+/5  R Shoulder Internal Rotation: 3+/5  R Shoulder External Rotation: 3+/5  R Shoulder Horizontal ABduction: 3+/5  R Shoulder Horizontal ADduction: 3+/5  R Elbow Flexion: 3+/5  R Elbow Extension: 3+/5  R Forearm Pronation: 3+/5  R Forearm Supination: 3+/5  R Wrist Flexion: 3+/5  R Wrist Extension: 3+/5  R Wrist Radial Deviation: 3+/5  R Wrist Ulnar Deviation: 3+/5     LUE Assessment   LUE Assessment: Within Functional Limits       Hand Function   Gross Grasp: Functional  Coordination: Functional    Edema   Edema: LLE    Sensation   Sensation: Impaired  Light Touch: Impaired;BLE (numbness bottom of B feet)    Cognition   Cognition: Within Defined Limits    Vision   Current Vision: No visual deficits     Neurological Evaluation (if indicated)   Neuro Deficits: No     Pain Assessment   Patient complains of pain: Yes  Pain Quality: Aching  Pain Scale Used: Numeric Pain Scale  Pain Intensity: 3/10;7/10 (3/10 at rest, increasing to 7/10 with movement)  Pain Location: Left;Knee  Action Taken: Nursing notified;Patient premedicated;Positioning    Patient Status   Activity Tolerance: Good  Oxygen Needs: Room Air  Response to Treatment: Tolerated treatment well;Vital signs stable;with activity;Asymptomatic;Nursing notified  Compliance with Precautions: Fair  Call light in reach: Yes  Presentation post treatment: In bed;Lines/drains intact    Interdisciplinary Communication   Interdisciplinary Communication: Nurse;Physical Therapist    ASSESSMENT   Rehab Potential: Good  Inpatient Recommendation: OT treatment  Problem List: Pain limiting ADLs;Decreased functional transfers;Decreased self care skills;Decreased sensation;Decreased UE strength;Discharge needs;Decreased functional balance;Impaired functional endurance  Present During Evaluation: Limitation of activities due to disability (Z73.6)  Treatment Plan: ADL training;Patient and/or family education;Therapeutic exercise;Discharge planning;Training on use of assistive devices;Home program;Functional transfer training;Functional balance activities  Frequency: 3-5 x/week  Duration (days): 30  Progress Note Due Date: 06/27/24    Goals Discussed With: Patient;Family    Short Term Goals to be achieved in: 7 days  Pt will toilet self: with minimum assist  Pt will dress lower body: with moderate assist  Pt will perform: stand step transfer;to/from toilet;with contact guard assist    Long Term Goals to be achieved in: 30 days  Pt will toilet self: with contact guard assist  Pt will dress lower body: with minimum assist  Pt  will perform: stand step transfer;to/from toilet;with stand by assist    OT Recommendations   Discharge Recommendation: Occupational Therapy;Would benefit from continued therapy (mod intensity)  Discharge concerns: Requires assistance for mobility;Requires assistance for self care  Discharge Equipment Recommended: Defer to discharge facility  Comments: may benefit from tub bench/shower chair, and 3-in-1 commode    AM-PAC   AM-PAC Daily Activity Raw Score: 15  AM-PAC Daily Activity t-Scale Score: 34.69  AM-PAC Daily Activity CMS 0-100% Score: 56.46 %  AM-PAC Daily Activity CMS 'G Code' Modifier: CK      Evaluation Completed by: Maudie Erika Skelton, OT,  06/20/2024

## 2024-06-20 NOTE — Progress Notes
 [facility] - Admission Medication Reconciliation Note      Patient Name: Daryl Graves  Medical Record Number: [id number]  Admit date: 06/19/2024 3:05 PM    Age: 73 y.o.  Sex: male  Allergies:   Allergies   Allergen Reactions    Ibuprofen Other (See Comments)     Sores in mouth     Height:   Most recent documented height   06/19/24 1.854 m (6' 1'')     Actual Weight:   Most recent documented weight   06/19/24 (!) 106.8 kg   01/26/24 (!) 113.4 kg     Diagnosis: The patient is currently admitted with the following concerns/issues: Active Problems:    * No active hospital problems. *      Reported Medication History   I spoke with the patient at bedside and used Surescripts (outpatient Rx fill history database) to update the home medication list for this hospital admission.    PTA Medication List (discrepancies are noted)   Medications Prior to Admission   Medication Sig Last Dose/Taking    aspirin 81 mg EC tablet Take 1 tablet (81 mg total) by mouth at bedtime. Taking    atorvastatin 10 mg tablet Take 1 tablet (10 mg total) by mouth at bedtime. Taking    azaTHIOprine 50 mg tablet Take 2 tablets (100 mg total) by mouth daily with dinner. 06/18/2024 at  6:00 PM    CELECOXIB  200 mg capsule TAKE 1 CAPSULE BY MOUTH TWO TIMES DAILY. Taking    empagliflozin (JARDIANCE) 10 mg tablet Take 1 tablet (10 mg total) by mouth every morning. Taking    LUMIGAN 0.01 % ophthalmic solution Place 1 drop into the right eye every other day. 06/19/2024 at  4:00 AM    mesalamine (LIALDA) 1.2 g DR tablet Take 4 tablets (4.8 g total) by mouth daily with dinner. 06/18/2024 at 10:00 AM    Semaglutide (OZEMPIC, 2 MG/DOSE, SC) Inject 2 mg under the skin once a week. 06/05/2024    silver sulfADIAZINE 1% cream Apply 1 Application topically two (2) times daily. Taking    ALPRAZolam  0.5 mg tablet Take one pill one hour before MRI. If needed may have another. Do not drive. Have driver arranged (Patient not taking: Reported on 06/20/2024) Not Taking    ASHWAGANDHA PO Take 1,000 capsules by mouth daily. 06/10/2024    cetirizine 10 mg tablet Take 1 tablet (10 mg total) by mouth daily. 06/10/2024    Cholecalciferol (VITAMIN D) 50 mcg (2000 units) CAPS Take 1 capsule (50 mcg total) by mouth daily. 06/10/2024    CINNAMON PO Take 2,000 mg by mouth daily. 06/10/2024    Coenzyme Q10 (COQ-10 PO) Take 1 tablet by mouth daily. 06/10/2024    cyanocobalamin 1000 mcg tablet Take 5 tablets (5,000 mcg total) by mouth daily. 06/10/2024    doxycycline  hyclate 100 mg tablet Take 1 tablet (100 mg total) by mouth two (2) times daily. (Patient not taking: Reported on 06/20/2024) Not Taking    finasteride 5 mg tablet TAKE 1 TABLET BY MOUTH EVERY DAY (Patient not taking: Reported on 06/20/2024) Not Taking    fish oil 1000 mg capsule Take 1 capsule (1 g total) by mouth daily. 06/10/2024    magnesium oxide 400 mg tablet Take 1 tablet (400 mg total) by mouth every other day. 06/10/2024    Misc Natural Products (PROSTATE HEALTH PO) Take 1 capsule by mouth daily.     Multiple Vitamins-Minerals (PRESERVISION AREDS 2 PO) Take 2  tablets by mouth daily. 06/10/2024    multivitamin tablet Take 1 tablet by mouth daily. 06/10/2024    tamsulosin 0.4 mg capsule TAKE ONE CAPSULE BY MOUTH TWO TIMES A DAY (Patient not taking: Reported on 06/20/2024) Not Taking    Zinc 50 MG TABS Take 50 mg by mouth every other day. 06/10/2024       Discharge Prescription Preference:   [facility] [place], [place] - [address] [place]  [address] [place] [address]      The patient's allergies and medications have been reviewed and updated.     The reconciliation of admission orders with PTA med list is complete. There are no issues requiring follow up at this time.    Please note, accuracy is based on the patient?s/family's/caregiver's ability and willingness to provide this information at the time of interview. This progress note represents our good faith effort to obtain the best possible medication history from all attainable sources at the time of reconciliation.    Abran Gavigan Kristy Younique Casad, PharmD, 06/20/2024, 1:29 PM

## 2024-06-20 NOTE — Consults
 IP CM ACTIVE DISCHARGE PLANNING  Department of Care Coordination      Admit Ijuz:[id number]  Anticipated Date of Discharge: 06/20/2024    Following FI:Rbmjw, Gwenn SQUIBB., MD    Today's Short Update   Will send order for fww when PT notes in CM to f/u in am.      Disposition   Expected Disposition: Home w/Home Health           Multidisciplinary Team Member Plan of Care   Interdisciplinary rounds were conducted with the multidisciplinary team including the clinical social worker and nurse case manager. The patient's plan of care and discharge plan were discussed and formulated based on the patient's specific needs.    Home Health Coordination Status   Order pending (1/7)          DME and/or RT Equipment Status   Order and attestation written (2/7)                    Charlie Satchel, RN CM  [facility]  Ph: [phone]  Fax [phone]

## 2024-06-20 NOTE — Consults
 CASE MANAGER ASSESSMENT    Admit Ijuz:[id number]    Date of Initial CM Assessment: 06/20/2024    Problems: Active Problems:    * No active hospital problems. *       Past Medical History:   Diagnosis Date    Arthritis     Bone spur of femur     right    BPH (benign prostatic hyperplasia)     Coccidioidomycosis     Diabetes mellitus (HCC/RAF)     Hyperlipidemia     Ulcerative colitis (HCC/RAF)     Past Surgical History:   Procedure Laterality Date    bilateral knee replacements      lL2009 R2010    bilateral thumb sx      CATARACT EXTRACTION Left     HIP SURGERY Right     for bone spur    left knee sx Left     for ganglion cyst    left shoulder sx      copolla    right shoulder sx      with hamilton    VASECTOMY          Primary Care Physician:Kothary, Hemmal Shashikant, MD  Phone:[phone]       NEEDS ASSESSMENT   Is the patient able to participate in the CM Initial Assessment at this time?: Yes  Information Obtained From: Patient  Level of Function Prior to Admit: Moderate Assist  Primary Living Situation: Lives w/Capable Spouse  Pre-admission Living Situation: Home/Apartment, 1-Story  Primary Support Systems: Spouse/significant other  Contact Name: Manuelita  Phone Number: [phone]  Does the patient have a Family/Support System member participating in Discharge Planning?: Yes  Bathroom on Main Floor: Yes  Stairs in Home: 0  Prior Treatments / Services: DME  DME Owned by Patient: Environmental consultant, Wheelchair  Who is your PCP?: Kothary, [employer]*  Do you have your Primary Care Doctor's office number?: Yes  Do you need information/education regarding your medical condition?: No               DISCHARGE ASSESSMENT   Projected Date of Discharge: 06/20/2024    Anticipated Complex D/C?: No  Projected Discharge to: Home  Discharge Address: [address]  [place] [address]  Projected Discharge Needs: Home Health  Support Identified at Discharge: Spouse  Name of Discharge Support Person: Manuelita  Phone Number: [phone]  Who is available to transport you upon discharge?: Family Transportation         SDOH Screening   Within the past 12 months, has a lack of transportation kept you from medical appointments, meetings, work, or from getting things needed for daily living? : No  How hard is it for you to pay for the very basics like food, housing, medical care, and heating?: Not hard at all  How hard is it for you to pay for prescriptions or medical bills?: Not hard at all  In the past 12 months has the electric, gas, oil, or water company threatened to shut off services in your home?: No  In the last 12 months, was there a time when you were not able to pay the mortgage or rent on time?: No  In the last 12 months, how many places have you lived?: 1  In the last 12 months, was there a time when you did not have a steady place to sleep or slept in a shelter (including now)?: No          Per pt, anticipate he  will need IV Abx/HH for 6 wk for dc.     Izetta Molly, BSN, RN, CCM  Department of Care Coordination     Care Coordination   Primary Team: Emergency Department   Cell: [phone]

## 2024-06-21 LAB — Magnesium: MAGNESIUM: 1.3 meq/L — ABNORMAL LOW (ref 1.5–2.0)

## 2024-06-21 LAB — Fungal Culture: FUNGAL CULTURE: NEGATIVE

## 2024-06-21 LAB — Anaerobic Culture
ANAEROBIC CULT-GM ST: NEGATIVE
ANAEROBIC CULT-GM ST: NEGATIVE
ANAEROBIC CULT-GM ST: NEGATIVE
ANAEROBIC CULT-GM ST: NEGATIVE

## 2024-06-21 LAB — MRSA Surveillance

## 2024-06-21 LAB — Basic Metabolic Panel: GLUCOSE: 151 mg/dL — ABNORMAL HIGH (ref 74–106)

## 2024-06-21 LAB — Glucose,POC
GLUCOSE,POC: 236 mg/dL — ABNORMAL HIGH (ref 65–99)
GLUCOSE,POC: 151 mg/dL — ABNORMAL HIGH (ref 65–99)
GLUCOSE,POC: 153 mg/dL — ABNORMAL HIGH (ref 65–99)

## 2024-06-21 LAB — Phosphorus: PHOSPHORUS: 2.7 mg/dL (ref 2.5–4.9)

## 2024-06-21 LAB — Bacterial Culture-Gm Stain: GRAM STAIN (GENERAL): NONE SEEN

## 2024-06-21 LAB — CBC: HEMOGLOBIN: 9.3 g/dL — ABNORMAL LOW (ref 13.5–17.1)

## 2024-06-21 LAB — Differential Automated: ABSOLUTE IMMATURE GRAN COUNT: <0.03 x10E3/uL (ref 0.00–0.04)

## 2024-06-21 MED ADMIN — INSULIN ASPART 100 UNIT/ML IJ SOLN: 10 mL | SUBCUTANEOUS | @ 05:00:00 | Stop: 2024-06-25

## 2024-06-21 MED ADMIN — DOCUSATE SODIUM 100 MG PO CAPS: 100 mg | ORAL | @ 17:00:00 | Stop: 2024-06-25 | NDC 00904728080

## 2024-06-21 MED ADMIN — MAGNESIUM SULFATE 4 GM/100ML IV SOLN: 4 g | INTRAVENOUS | @ 23:00:00 | Stop: 2024-06-25 | NDC 00264420654

## 2024-06-21 MED ADMIN — VANCOMYCIN 1 G IN NS IVPB MB: 1 g | INTRAVENOUS | @ 09:00:00 | Stop: 2024-06-22 | NDC 00338004902

## 2024-06-21 MED ADMIN — TEMAZEPAM 7.5 MG PO CAPS: 7.5 mg | ORAL | @ 10:00:00 | Stop: 2024-06-25 | NDC 68084054911

## 2024-06-21 MED ADMIN — VANCOMYCIN 1 G IN NS IVPB MB: 1 g | INTRAVENOUS | @ 01:00:00 | Stop: 2024-06-22 | NDC 00338004902

## 2024-06-21 MED ADMIN — OXYCODONE HCL 5 MG PO TABS: 5 mg | ORAL | @ 14:00:00 | Stop: 2024-06-25 | NDC 00406055262

## 2024-06-21 MED ADMIN — HYDROMORPHONE HCL 1 MG/ML IJ SOLN: .5 mg | INTRAVENOUS | @ 03:00:00 | Stop: 2024-06-25 | NDC 00409426411

## 2024-06-21 MED ADMIN — LACTULOSE 10 GM/15ML PO SOLN: 20 g | ORAL | @ 04:00:00 | Stop: 2024-06-25 | NDC 00116400530

## 2024-06-21 MED ADMIN — OXYCODONE HCL 5 MG PO TABS: 10 mg | ORAL | @ 01:00:00 | Stop: 2024-06-25 | NDC 00406055262

## 2024-06-21 MED ADMIN — VANCOMYCIN 1 G IN NS IVPB MB: 1 g | INTRAVENOUS | @ 17:00:00 | Stop: 2024-06-22 | NDC 00338004902

## 2024-06-21 MED ADMIN — CEFTRIAXONE 2 G IN SWFI IVP: 2 g | INTRAVENOUS | @ 01:00:00 | Stop: 2024-06-23 | NDC 00409733513

## 2024-06-21 MED ADMIN — RIVAROXABAN 20 MG PO TABS: 10 mg | ORAL | @ 01:00:00 | Stop: 2024-06-25 | NDC 50458057910

## 2024-06-21 MED ADMIN — DOCUSATE SODIUM 100 MG PO CAPS: 100 mg | ORAL | @ 04:00:00 | Stop: 2024-06-25 | NDC 00904718361

## 2024-06-21 MED ADMIN — INSULIN ASPART 100 UNIT/ML IJ SOLN: 10 mL | SUBCUTANEOUS | @ 01:00:00 | Stop: 2024-06-25 | NDC 00169750111

## 2024-06-21 NOTE — Consults
 IP CM ACTIVE DISCHARGE PLANNING  Department of Care Coordination      Admit Ijuz:[id number]  Anticipated Date of Discharge: 06/21/24    Following FI:Rbmjw, Gwenn SQUIBB., MD    Today's Short Update   I sent fww order to [employer] via parachute. CM to f/u.    12:34 PM  [employer] delivering FWW to bedside by 3pm today.   Disposition   Expected Disposition: Home w/Home Health  Discharge Address: [phone]  [place] [address]      FREEDOM OF CHOICE/PATIENT CHOICE   Treatment Preferences: Addressed  Discharge Goals: Addressed  Freedom of Choice: Home Health Agency, Durable Medical Equipment  Restricted Services Transfer: Educated and Provided, Patient Agreeable to Transfer  Restricted Services Date Provided: 06/21/24  Aidin Choice List: Provided to Pt/Family  Date Provided: 06/21/24        Multidisciplinary Team Member Plan of Care   Interdisciplinary rounds were conducted with the multidisciplinary team including the clinical social worker and nurse case manager. The patient's plan of care and discharge plan were discussed and formulated based on the patient's specific needs.    Home Health Coordination Status   Order pending (1/7)          DME and/or RT Equipment Status   Referral sent-out to providers (via [employer]) (3/7)                          Charlie Satchel, RN CM  [facility]  Ph: [phone]  Fax [phone]

## 2024-06-21 NOTE — Consults
 NUTRITION ASSESSMENT (Adult)    Admit Date: 06/19/2024     Date of Birth: Aug 23, 1951 Gender: male MRN: [id number]     Date of Assessment: 06/21/2024   Indication: Moderate Nutrition Risk Factors:  (knee infection)  Acuity Level: 1-No risk identified       Subjective: Pt reports good appetite, states started following very low carbohydrate diet PTA. Pt had some concerns regarding receiving carbohydrates with his meals. RD provided carbohydrate controlled diet education, pt verbalized understanding.     Past Medical History:   Diagnosis Date    Arthritis     Bone spur of femur     right    BPH (benign prostatic hyperplasia)     Coccidioidomycosis     Diabetes mellitus (HCC/RAF)     Hyperlipidemia     Ulcerative colitis (HCC/RAF)     Past Surgical History:   Procedure Laterality Date    bilateral knee replacements      lL2009 R2010    bilateral thumb sx      CATARACT EXTRACTION Left     HIP SURGERY Right     for bone spur    left knee sx Left     for ganglion cyst    left shoulder sx      copolla    right shoulder sx      with hamilton    VASECTOMY            Labs and Medications reviewed     Diet Info   Allergies:   Ibuprofen  Cultural/Ethnic/Religious/Other Food Preferences:  None     Nutrition Regimen PTA:  Carbohydrate controlled diet  Current diet order:     Diets/Supplements/Feeds   Diet    Diet carbohydrate controlled     Start Date/Time: 06/20/24 1650      Number of Occurrences: Until Specified     PO % consumed: 51 to 75%  Nutrition Support: none  Other caloric sources: none     Anthropometrics:   Height: 185.4 cm (6' 0.99'')  Admit Weight: (!) 106.8 kg (235 lb 6.4 oz) (06/19/24 0927)  Current Weight: 106.8 kg (235 lb 7.2 oz)  BMI: 31.07  IBW: 83 kg/184 lbs  IBW %: 128  Adj BW (if applicable): 86 kg  UBW:    UBW %:         Weight History   Wt Readings from Last 10 Encounters:   06/19/24 (!) 106.8 kg (235 lb 6.4 oz)   01/26/24 (!) 113.4 kg (250 lb)   01/11/24 (!) 115.2 kg (254 lb)   12/20/23 (!) 115.7 kg (255 lb) 10/27/22 (!) 115.2 kg (254 lb)   04/27/21 (!) 117.9 kg (259 lb 14.8 oz)   03/30/21 (!) 117.9 kg (259 lb 14.8 oz)   02/08/21 (!) 117.9 kg (259 lb 14.8 oz)   12/15/20 (!) 117.9 kg (260 lb)        Estimated Nutrition Needs   Based on wt: 83 kg  Calories: 25 - 30 cal/kg = 2075 - 2490 cal/day  Protein: 1 - 1.2 g pro/kg = 83 - 99.6 g pro/day     Diet Education   Teaching provided (Refer to Patient Education records)          Malnutrition Assessment   Malnutrition in the Context of: Chronic   Energy Intake: Does not meet criterion  Weight Loss: Does not meet criterion    Nutrition-Focused Physical Exam: 06/21/2024  No Indication for NFPE      Patient  meets criteria for: Patient does not meet criteria for Malnutrition at this time    Nutrition Assessment   Anthropometrics: BMI obese, pt reports intentional wt loss 5lb past 3 months    Nutrition and Tolerance: tolerating po diet well    GI:   Patient is not on bowel regimen. LBM 10/2    Labs: glucose elevated, Mg low    Micronutrients: none to review    Skin: Surgical wound L knee     Recommendations / Care Plan      Continue carbohydrate controlled  Monitor po intake for need of Boost glucose control    Author: Carlethia Mesquita, RDN    06/21/2024 1:45 PM       Communicated with RN  RD to monitor and follow-up per nutrition policy

## 2024-06-21 NOTE — Progress Notes
 POD#2 s/p  L knee explantation and ABX spacer  Pain well controlled, no BM.  Pt OOB to hallway unassisted  VSS AF, HGB 9,8->9,3  Cult + Staph Lug.      PE - LLE - bandage CDI  JP drain w/ SS drainage  +EHL/FHL    A/P await sensitivities, plan on DC after ID has made recs, and that home ABX therapy is set up

## 2024-06-21 NOTE — Other
 Patient's Clinical Goal:   Clinical Goal(s) for the Shift: Maintain adequate pain control  Identify possible barriers to advancing the care plan:   Stability of the patient: Moderately Stable - low risk of patient condition declining or worsening   Progression of Patient's Clinical Goal:   Neuro: Within Defined Limits   BMAT: Level 2 - [place]  Assistive Device: Turning and Positioning (TAP)    Vitals:    Temp:  [36.5 ?C (97.7 ?F)-37.1 ?C (98.8 ?F)] 36.7 ?C (98.1 ?F)  Heart Rate:  [72-77] 73  Resp:  [17-18] 17  BP: (112-143)/(59-73) 143/63  NBP Mean:  [78-94] 90  SpO2:  [93 %-96 %] 96 %  Cardiac:  Respiratory:  None (Room air)        Pain: Left;Knee  Last BM: 06/20/24  GU: Voiding  Skin: Intact except incision  Lines/Tubes/Drains:   Peripheral IV 20 G Anterior;Right Wrist (2)  Negative Pressure Wound Therapy Thigh  Surgical Drain Anterior;Left Knee Bulb (2)       Shift Events/Acute Changes:   No acute events this shift . Pain manangement w/ Dilaudid. Bowel reg given to promote BM. Hourly rounding completed, Call light in reach, bed low/locked; current pain regimen adequate to manage pain; Fall/safety precaution promoted throughout shift. POC to be endorsed to oncoming RN.     To Do/Pending:  PT/OT; Abx     Nursing Considerations for Discharge:  Homehealth     Expected Discharge Disposition: Home w/Home Health     Expected Discharge Date: 06/20/2024

## 2024-06-21 NOTE — Progress Notes
 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, procedure,,   I and D of left knee, extraction of infection prosthesis, and abx cement placement,   Events/Complaints: Overnight patient - no event overnight,     OBJECTIVE:  Vitals signs:   Temp:  [36.5 ?C (97.7 ?F)-37.3 ?C (99.1 ?F)] 37.3 ?C (99.1 ?F)  Heart Rate:  [70-74] 72  Resp:  [17-18] 17  BP: (112-143)/(59-73) 136/73  NBP Mean:  [78-94] 94  SpO2:  [93 %-96 %] 95 %      PHYSICAL EXAM:  General: WDWN, NAD; Awake, alert, grossly oriented, memory good, affect normal  HEENT: Sclerae anicteric, pupils symmetrical; oropharynx clear, mmm  NECK: No thyromegaly, no masses; JVD   PULM: normal resp effort, lungs ctab no w/r/r  CARDIOVASCULAR: rrr, no murmurs, S4, no edema  GI: soft, nontender, nondistended; no hsm, no masses  BACK: No CVAT, no suprapubic tenderness  SKIN:  without rashes/lesions, warm  PSYCH: Good judgement and insight  NEURO: No obvious focal weakness, normal muscle bulk throughout    LABORATORY DATA (REVIEWED):     Recent Labs     06/21/24  0539 06/20/24  0608   WBC 5.08 6.73   HGB 9.3* 9.8*   HCT 28.2* 29.9*   MCV 89.0 89.3   PLT 156 160     Recent Labs     06/21/24  0539 06/20/24  0608   NA 137 137   K 3.6 4.0   CL 106 107   CO2 30 27   BUN 11 18   CREAT 0.40* 0.60*   CALCIUM 8.9 9.3   MG 1.3*  --    PHOS 2.7  --      estimated creatinine clearance is 122.4 mL/min (A) (by C-G formula based on SCr of 0.4 mg/dL (L)).    Recent Labs     06/20/24  0608   ALBUMIN 2.7*       MICROBIOLOGY:  Recent Results (from the past 6 weeks)   MRSA Surveillance, Nares    Collection Time: 06/19/24  9:00 AM    Specimen: Nares; Respiratory, Upper   Result Value Ref Range    MRSA Surveillance       No Methicillin-resistant Staphylococcus aureus isolated.   Fungal Culture, Tissue    Collection Time: 06/19/24  1:46 PM    Specimen: Knee, Left; Tissue   Result Value Ref Range    Fungal Culture Negative To Date    Bacterial Culture-Gm Stain, Tissue    Collection Time: 06/19/24  1:46 PM    Specimen: Knee, Left; Tissue   Result Value Ref Range    Bacterial Aerobic Culture Rare Staphylococcus lugdunensis (AA)     Gram Stain No bacteria seen.    Bacterial Anaerobic Culture, Tissue    Collection Time: 06/19/24  1:46 PM    Specimen: Knee, Left; Tissue   Result Value Ref Range    Anaerobic Culture Negative To Date    Fungal Culture, Tissue    Collection Time: 06/19/24  1:47 PM    Specimen: Knee, Left; Tissue   Result Value Ref Range    Fungal Culture Negative To Date  Bacterial Culture-Gm Stain, Tissue    Collection Time: 06/19/24  1:47 PM    Specimen: Knee, Left; Tissue   Result Value Ref Range    Bacterial Aerobic Culture Rare Staphylococcus lugdunensis (AA)     Gram Stain No bacteria seen.     Gram Stain Few WBC    Bacterial Anaerobic Culture, Tissue    Collection Time: 06/19/24  1:47 PM    Specimen: Knee, Left; Tissue   Result Value Ref Range    Anaerobic Culture Negative To Date    Fungal Culture, Tissue    Collection Time: 06/19/24  1:47 PM    Specimen: Knee, Left; Tissue   Result Value Ref Range    Fungal Culture Negative To Date    Bacterial Culture-Gm Stain, Tissue    Collection Time: 06/19/24  1:47 PM    Specimen: Knee, Left; Tissue   Result Value Ref Range    Bacterial Aerobic Culture Few Staphylococcus lugdunensis (AA)     Gram Stain No bacteria seen.    Bacterial Anaerobic Culture, Tissue    Collection Time: 06/19/24  1:47 PM    Specimen: Knee, Left; Tissue   Result Value Ref Range    Anaerobic Culture Negative To Date    Fungal Culture, Tissue    Collection Time: 06/19/24  1:47 PM    Specimen: Knee, Left; Tissue   Result Value Ref Range    Fungal Culture Negative To Date    Bacterial Culture-Gm Stain, Tissue Collection Time: 06/19/24  1:47 PM    Specimen: Knee, Left; Tissue   Result Value Ref Range    Bacterial Aerobic Culture Negative To Date     Gram Stain No bacteria seen.    Bacterial Anaerobic Culture, Tissue    Collection Time: 06/19/24  1:47 PM    Specimen: Knee, Left; Tissue   Result Value Ref Range    Anaerobic Culture Negative To Date        MEDICATIONS:  cefTRIAXone in SWFI 100 mg/mL IV inj (from 2 g vial)  doxycycline  hyclate  vancomycin 1 g in sodium chloride 250 mL IVPB MB    RADIOLOGY:   No imaging has been resulted in the last 24 hours     ASSESSMENT /PLAN:  06/21/24  Stable, afebrile, on room air,   Post op s/p extraction of infected left knee hardware, and cement placement,   Surgical cx staph lugdunensis,, sensivity pending,   If oxacillin sensitive, --- pt can be transitioned to IV cefazoline 2g IV q8 for 6 weeks, discussed with [facility] microbiology, cx will finalize on 06/22/24  Hydration, supportive tx,   ID consult will fu,       Cletis Smolder, MD

## 2024-06-21 NOTE — Progress Notes
 Physical Therapy Treatment      PATIENT: Daryl Graves  MRN: [id number]    Treatment Date: 06/21/2024    Patient Presentation: Position: In bed;Family/CG present  Lines/devices Drains: IV/PICC;Cardiac Monitor;JP Drain/s    Pertinent Updates: Patient seen and was amenable to mobilize and ambulate with PT today. Patient aware of weight bearing status and was able to get up from bed at Min A x 1 with needing assistance on leg to move out of bed. Patient able to get up to stand CGA x 1 with FWW and demonstrated ability to ambulate following TTWB but patient only ambulated initially for 10'. Encouraged patient to ambulate more if he wanted to go home as patient does not want to go to rehab. Patient ambulated for 15' more feet and returned back to room and sat in bed. Leg thera ex AAROM on left leg but unable to bend knee much. Noted knee pain when flexing knee. Leg exercises reviewed and did while in bed, glut setting, quad setting, assisted heel slide, ankle pumps.    Precautions   Precautions: Fall risk;Wound  Orthotic: None  Current Activity Order: Activity as tolerated  Weight Bearing Status: Touch Down/Toe Touch Weight Bearing    Cognition   Cognition: Within Defined Limits  Safety Awareness: Fair awareness of safety precautions  Barriers to Learning: None    Bed Mobility   Supine to Sit: Minimum Assist;Assist for LE(s)  Sit to Supine: Stand by Assist    Functional Mobility   Sit to Stand: Contact Guard Assist  Ambulation: Soil scientist (Feet): 10' and 15'  Gait Pattern: Step to Gait  Assistive Device: Front wheeled walker     Balance   Sitting - Static: Good  Sitting - Dynamic: Good  Standing - Static: Fair;with UE support  Standing - Dynamic: Fair;with UE support    AM-PAC   AM-PAC Basic Mobility Raw Score: 18  AM-PAC Basic Mobility t-Scale Score: 41.05  AM-PAC Basic Mobility CMS 0-100% Score: 40.47 %    Neurological (if indicated)   Neuro Deficits: No    Exercises   Glut Sets: 10 Reps;Bilateral  Quad Sets: 10 Reps;Right  Ankle Pumps: 10 Reps;Bilateral;Active  Heel Slides: Active Assist;5 Reps;Right     Total Knee (if indicated)        Pain Assessment   Patient complains of pain: Yes  Pain Quality: Aching  Pain Scale Used: Numeric Pain Scale  Pain Intensity: at rest;with activity;4/10  Pain Location: Left;Knee  Action Taken: Patient premedicated    Patient Status   Activity Tolerance: Fair  Oxygen Needs: Room Air  Response to Treatment: Tolerated treatment well;Vital signs stable;Fatigued;Pain;with activity;Resolved with rest;Nursing notified  Compliance with Precautions: Fair  Call light in reach: Yes  Presentation post treatment: In bed;On cardiac monitor;Lines/drains intact;Family/CG present    Interdisciplinary Communication   Interdisciplinary Communication: Nurse    Treatment Plan   Continue PT Treatment Plan with Focus on: Bed mobility training;Transfer training;Gait training;Range of motion;Therapeutic exercise;Education on precautions;Patient/family/caregiver education and training    PT Recommendations   Discharge Recommendation: Physical Therapy;Would benefit from continued therapy;Would benefit from intensive therapy upon discharge;Able to tolerate 3+ hours of therapy per day  Discharge concerns: Requires supervision for mobility;Requires supervision for self care;Requires assistance for mobility;Requires assistance for self care  Discharge Equipment Recommended: Defer to discharge facility;If patient dc home instead of rehab as recommended;Walker;Tub Bench;Commode  Comments: currently recommending high intensity therapy due to patient requiring assist for mobility and ADLs with  risk for falls; patient's wife states worry about caring for husband at home; to be further assessed -- may tolerate low intensity therapy with family assist with proper DME and further caregiver training.    Treatment Completed by: Kynsleigh Westendorf Tiu Malachi Suderman, PT

## 2024-06-21 NOTE — Progress Notes
 Pharmaceutical Services - Vancomycin Dosing (Initial)    Patient Name: Daryl Graves  MRN: [id number]  Ht 1.854 m (6' 1'')  ~ Wt (!) 106.8 kg  ~ BMI 31.06 kg/m?      Vancomycin per Pharmacy Consult Order (From admission, onward)       Start     Ordered    06/20/24 1639  vancomycin per pharmacy  Per Protocol        Question Answer Comment   Indication Bone and/or Joint Infection    Has patient received a dose of this drug within the past 72 hours? No    Is patient ESRD on HD? No        06/20/24 1640                    Vancomycin Administration  Med Administrations and Associated Flowsheet Values (last 24 hours)  Vancomycin administration      Date/Time Action Medication Dose Rate    06/21/24 1748 New Bag/ Syringe/ Cartridge    vancomycin 1 g in sodium chloride 250 mL IVPB MB 1 g 250 mL/hr    06/21/24 9063 New Bag/ Syringe/ Cartridge    vancomycin 1 g in sodium chloride 250 mL IVPB MB 1 g 250 mL/hr    06/21/24 0150 New Bag/ Syringe/ Cartridge    vancomycin 1 g in sodium chloride 250 mL IVPB MB 1 g 250 mL/hr            Vancomycin,trough (mcg/mL)   Date/Time Value   06/21/2024 1716 8.2 (L)       Recent Labs   Lab 06/20/24  0608 06/21/24  0539   WBC 6.73 5.08   BUN 18 11   CREAT 0.60* 0.40*       Microbiology Data  Recent Results (from the past week)   MRSA Surveillance, Nares    Collection Time: 06/19/24  9:00 AM    Specimen: Nares; Respiratory, Upper   Result Value Ref Range    MRSA Surveillance       No Methicillin-resistant Staphylococcus aureus isolated.   Fungal Culture, Tissue    Collection Time: 06/19/24  1:46 PM    Specimen: Knee, Left; Tissue   Result Value Ref Range    Fungal Culture Negative To Date    Bacterial Culture-Gm Stain, Tissue    Collection Time: 06/19/24  1:46 PM    Specimen: Knee, Left; Tissue   Result Value Ref Range    Bacterial Aerobic Culture Rare Staphylococcus lugdunensis (AA)     Gram Stain No bacteria seen.    Bacterial Anaerobic Culture, Tissue    Collection Time: 06/19/24  1:46 PM Specimen: Knee, Left; Tissue   Result Value Ref Range    Anaerobic Culture Negative To Date    Fungal Culture, Tissue    Collection Time: 06/19/24  1:47 PM    Specimen: Knee, Left; Tissue   Result Value Ref Range    Fungal Culture Negative To Date    Bacterial Culture-Gm Stain, Tissue    Collection Time: 06/19/24  1:47 PM    Specimen: Knee, Left; Tissue   Result Value Ref Range    Bacterial Aerobic Culture Rare Staphylococcus lugdunensis (AA)     Gram Stain No bacteria seen.     Gram Stain Few WBC    Bacterial Anaerobic Culture, Tissue    Collection Time: 06/19/24  1:47 PM    Specimen: Knee, Left; Tissue   Result Value Ref Range    Anaerobic  Culture Negative To Date    Fungal Culture, Tissue    Collection Time: 06/19/24  1:47 PM    Specimen: Knee, Left; Tissue   Result Value Ref Range    Fungal Culture Negative To Date    Bacterial Culture-Gm Stain, Tissue    Collection Time: 06/19/24  1:47 PM    Specimen: Knee, Left; Tissue   Result Value Ref Range    Bacterial Aerobic Culture Few Staphylococcus lugdunensis (AA)     Gram Stain No bacteria seen.    Bacterial Anaerobic Culture, Tissue    Collection Time: 06/19/24  1:47 PM    Specimen: Knee, Left; Tissue   Result Value Ref Range    Anaerobic Culture Negative To Date    Fungal Culture, Tissue    Collection Time: 06/19/24  1:47 PM    Specimen: Knee, Left; Tissue   Result Value Ref Range    Fungal Culture Negative To Date    Bacterial Culture-Gm Stain, Tissue    Collection Time: 06/19/24  1:47 PM    Specimen: Knee, Left; Tissue   Result Value Ref Range    Bacterial Aerobic Culture Negative To Date     Gram Stain No bacteria seen.    Bacterial Anaerobic Culture, Tissue    Collection Time: 06/19/24  1:47 PM    Specimen: Knee, Left; Tissue   Result Value Ref Range    Anaerobic Culture Negative To Date        MRSA Nares: N/A     Assessment    Indication: Bone and/or Joint Infection  Goal: AUC 400-600 mg*hr/L  PK Parameters: Vd 61.5 L, T1/2 5.78 hours, CL 7.37 L/hr  This patient is receiving renal replacement therapy: None      Plan  Increase Vancomycin to 1250 mg IV Q8h.   On this regimen, estimated 24-hour AUC is 507 mg*h/L. This corresponds to an estimated trough of 10.61 mcg/mL predicted by [employer].    Next vancomycin level is scheduled on 06/23/24 at 1700.      Aisea Bouldin Vallorie Lacy Mort, PharmD, 06/21/2024, 7:59 PM

## 2024-06-21 NOTE — Progress Notes
 Patient Name: Daryl Graves MRN: [id number]   Age: 73 y.o.  Date of Birth: May 11, 1951   Date of Service: 06/21/2024     Chief Complaint: Lt knee pain    Physical Examination:    Vital Signs: BP 131/69 (Patient Position: Lying)  ~ Pulse 70  ~ Temp 37 ?C (98.6 ?F) (Oral)  ~ Resp 17  ~ Ht 1.854 m (6' 1'')  ~ Wt (!) 106.8 kg (235 lb 6.4 oz)  ~ SpO2 95%  ~ BMI 31.06 kg/m?     I/Os:   Intake/Output Summary (Last 24 hours) at 06/21/2024 1117  Last data filed at 06/21/2024 1000  Gross per 24 hour   Intake 570 ml   Output 662 ml   Net -92 ml        General appearance: Alert, oriented, no acute distress  HEENT: Normal, Atraumatic, normocephalic, PERRLA   Neck:   no carotid bruit and no JVD  Cardiovascular: Regular rate and rhythm and no murmurs   Lungs:   clear to auscultation bilaterally  Abdomen: abdomen is soft without significant tenderness, masses, organomegaly or guarding  Extremities:   No pedal edema  Dressing Lt knee  Neuro/CNS:   AAOx3, Cranial nerves II to XII WNLs, No motor defeciets, Sensory exam normal    Current Medications:   Current Facility-Administered Medications   Medication Dose Route Frequency    acetaminophen  tab 650 mg  650 mg Oral Q6H PRN    Or    acetaminophen  650 mg supp 650 mg  650 mg Rectal Q6H PRN    aluminum-magnesium hydroxide-simethicone 400-400-40 mg/5 mL susp 30 mL  30 mL Oral Q6H PRN    bisacodyl EC tab 5 mg  5 mg Oral Daily PRN    calcium gluconate 2 g in sodium chloride 100 mL IVPB RTU  2 g Intravenous PRN    cefTRIAXone 2 g in sterile water PF 20 mL IV injection  2 g IV Push Q24H    insulin aspart (NovoLOG) 100 units/mL inj vial   Subcutaneous QAC & QHS PRN    And    dextrose 50% inj 25 g  25 g IV Push PRN    docusate cap 100 mg  100 mg Oral BID    hydrALAZINE tab 25 mg  25 mg Oral Q6H PRN    oxyCODONE tab 5 mg  5 mg Oral Q4H PRN    Or    HYDROmorphone 1 mg/mL inj 0.5 mg  0.5 mg IV Push Q4H PRN    lactulose 10 g/15 mL soln 20 g  20 g Oral TID PRN    magnesium hydroxide 400 mg/5 mL susp 30 mL  30 mL Oral Daily PRN    magnesium sulfate 2 g in water for injection 50 mL RTU  2 g Intravenous PRN    Or    magnesium sulfate 4 g in water for injection 100 mL RTU  4 g Intravenous PRN    naloxone 0.4 mg/mL inj 0.1 mg  0.1 mg IV Push Q5 min PRN    ondansetron tab 4 mg  4 mg Oral Q8H PRN    Or    ondansetron 4 mg/2 mL inj 4 mg  4 mg Intravenous Q8H PRN    oxyCODONE tab 5 mg  5 mg Oral Q4H PRN    Or    oxyCODONE tab 10 mg  10 mg Oral Q4H PRN    pneumococcal 20-valent vaccine (Prevnar 20) inj 0.5 mL  0.5 mL  Intramuscular Once    potassium chloride 10 mEq in water for injection 100 mL IVPB RTU  10 mEq Intravenous PRN    Or    potassium chloride 40 mEq/500 mL IVPB RTU (peripheral) (batched)  40 mEq Intravenous PRN    Or    potassium chloride 30 mEq/500 mL IVPB RTU (peripheral) (batched)  30 mEq Intravenous PRN    Or    potassium chloride 40 mEq/500 mL IVPB RTU (peripheral) (batched)  40 mEq Intravenous PRN    potassium chloride ER tab 20 mEq  20 mEq Oral PRN    Or    potassium chloride ER tab 40 mEq  40 mEq Oral PRN    Or    potassium chloride ER tab 60 mEq  60 mEq Oral PRN    Or    potassium chloride ER tab 80 mEq  80 mEq Oral PRN    rivaroxaban tab 10 mg  10 mg Oral Q24H    senna tab 1 tablet  1 tablet Oral QHS PRN    temazepam cap 7.5 mg  7.5 mg Oral QHS PRN    vancomycin 1 g in sodium chloride 250 mL IVPB MB  1 g Intravenous Q8H    vancomycin per pharmacy   Does not apply Per Protocol        (click to expand/collapse)   Labs:   Recent Results (from the past week)   Type and screen (Admission On Day Of Surgery: Nurse Protocol)    Collection Time: 06/19/24  9:00 AM   Result Value Ref Range    Antibody Screen Negative     ABORh A Positive    MRSA Surveillance, Nares    Collection Time: 06/19/24  9:00 AM    Specimen: Nares; Respiratory, Upper   Result Value Ref Range    MRSA Surveillance       No Methicillin-resistant Staphylococcus aureus isolated.   Check type (Admission On Day Of Surgery: Nurse Protocol) Collection Time: 06/19/24  9:04 AM   Result Value Ref Range    Blood Type Confirmed     ABO/Rh Type A Positive    Fungal Culture, Tissue    Collection Time: 06/19/24  1:46 PM    Specimen: Knee, Left; Tissue   Result Value Ref Range    Fungal Culture Negative To Date    Bacterial Culture-Gm Stain, Tissue    Collection Time: 06/19/24  1:46 PM    Specimen: Knee, Left; Tissue   Result Value Ref Range    Bacterial Aerobic Culture Rare Staphylococcus lugdunensis (AA)     Gram Stain No bacteria seen.    Fungal Culture, Tissue    Collection Time: 06/19/24  1:47 PM    Specimen: Knee, Left; Tissue   Result Value Ref Range    Fungal Culture Negative To Date    Bacterial Culture-Gm Stain, Tissue    Collection Time: 06/19/24  1:47 PM    Specimen: Knee, Left; Tissue   Result Value Ref Range    Bacterial Aerobic Culture Rare Staphylococcus lugdunensis (AA)     Gram Stain No bacteria seen.     Gram Stain Few WBC    Fungal Culture, Tissue    Collection Time: 06/19/24  1:47 PM    Specimen: Knee, Left; Tissue   Result Value Ref Range    Fungal Culture Negative To Date    Bacterial Culture-Gm Stain, Tissue    Collection Time: 06/19/24  1:47 PM    Specimen: Knee, Left; Tissue  Result Value Ref Range    Bacterial Aerobic Culture Few Staphylococcus lugdunensis (AA)     Gram Stain No bacteria seen.    Fungal Culture, Tissue    Collection Time: 06/19/24  1:47 PM    Specimen: Knee, Left; Tissue   Result Value Ref Range    Fungal Culture Negative To Date    Bacterial Culture-Gm Stain, Tissue    Collection Time: 06/19/24  1:47 PM    Specimen: Knee, Left; Tissue   Result Value Ref Range    Bacterial Aerobic Culture Negative To Date     Gram Stain No bacteria seen.    POCT glucose    Collection Time: 06/19/24  2:50 PM   Result Value Ref Range    Glucose, Point of Care 165 (H) 65 - 99 mg/dL   POCT glucose    Collection Time: 06/19/24  6:33 PM   Result Value Ref Range    Glucose, Point of Care 172 (H) 65 - 99 mg/dL   POCT glucose    Collection Time: 06/19/24  9:11 PM   Result Value Ref Range    Glucose, Point of Care 210 (H) 65 - 99 mg/dL   C-reactive protein    Collection Time: 06/20/24  6:08 AM   Result Value Ref Range    C-Reactive Protein 5.2 (H) <0.3 mg/dL   Sedimentation rate, westergren    Collection Time: 06/20/24  6:08 AM   Result Value Ref Range    Sedimentation rate, Erythrocyte 32 (H) <=12 mm/hr   Albumin    Collection Time: 06/20/24  6:08 AM   Result Value Ref Range    Albumin 2.7 (L) 3.4 - 5.0 g/dL   Basic Metabolic Panel    Collection Time: 06/20/24  6:08 AM   Result Value Ref Range    Sodium 137 136 - 145 mmol/L    Potassium 4.0 3.5 - 5.1 mmol/L    Chloride 107 98 - 107 mmol/L    Total CO2 27 21 - 32 mmol/L    Anion Gap 3 (L) 8 - 19 mmol/L    Glucose 156 (H) 74 - 106 mg/dL    Creatinine 9.39 (L) 0.70 - 1.30 mg/dL    Estimated GFR >10 See GFR Additional Information mL/min/1.66m2    GFR Additional Information      Urea Nitrogen 18 7 - 18 mg/dL    Calcium 9.3 8.5 - 89.8 mg/dL   CBC    Collection Time: 06/20/24  6:08 AM   Result Value Ref Range    White Blood Cell Count 6.73 4.16 - 9.95 x10E3/uL    Red Blood Cell Count 3.35 (L) 4.41 - 5.95 x10E6/uL    Hemoglobin 9.8 (L) 13.5 - 17.1 g/dL    Hematocrit 70.0 (L) 38.5 - 52.0 %    Mean Corpuscular Volume 89.3 79.3 - 98.6 fL    Mean Corpuscular Hemoglobin 29.3 26.4 - 33.4 pg    MCH Concentration 32.8 31.5 - 35.5 g/dL    Red Cell Distribution Width-SD 46.6 36.9 - 48.3 fL    Red Cell Distribution Width-CV 14.3 11.1 - 15.5 %    Platelet Count, Auto 160 143 - 398 x10E3/uL    Mean Platelet Volume 8.8 (L) 9.3 - 13.0 fL    Nucleated RBC%, automated 0.0 No Ref. Range %    Absolute Nucleated RBC Count 0.00 0.00 - 0.00 x10E3/uL   Differential, Automated    Collection Time: 06/20/24  6:08 AM   Result Value Ref Range  Neutrophil Percent, Auto 71.4 No Ref. Range %    Lymphocyte Percent, Auto 16.8 No Ref. Range %    Monocyte Percent, Auto 11.0 No Ref. Range %    Eosinophil Percent, Auto 0.3 No Ref. Range % Basophil Percent, Auto 0.1 No Ref. Range %    Immature Granulocytes% 0.4 No Reference Range %    Absolute Neut Count 4.80 1.80 - 6.90 x10E3/uL    Absolute Lymphocyte Count 1.13 (L) 1.30 - 3.40 x10E3/uL    Absolute Mono Count 0.74 0.20 - 0.80 x10E3/uL    Absolute Eos Count <0.03 0.00 - 0.50 x10E3/uL    Absolute Baso Count <0.03 0.00 - 0.10 x10E3/uL    Absolute Immature Gran Count 0.03 0.00 - 0.04 x10E3/uL   POCT glucose    Collection Time: 06/20/24  7:29 AM   Result Value Ref Range    Glucose, Point of Care 141 (H) 65 - 99 mg/dL   POCT glucose    Collection Time: 06/20/24  5:30 PM   Result Value Ref Range    Glucose, Point of Care 236 (H) 65 - 99 mg/dL   POCT glucose    Collection Time: 06/20/24  9:27 PM   Result Value Ref Range    Glucose, Point of Care 151 (H) 65 - 99 mg/dL   Basic Metabolic Panel    Collection Time: 06/21/24  5:39 AM   Result Value Ref Range    Sodium 137 136 - 145 mmol/L    Potassium 3.6 3.5 - 5.1 mmol/L    Chloride 106 98 - 107 mmol/L    Total CO2 30 21 - 32 mmol/L    Anion Gap 1 (L) 8 - 19 mmol/L    Glucose 151 (H) 74 - 106 mg/dL    Creatinine 9.59 (L) 0.70 - 1.30 mg/dL    Estimated GFR >10 See GFR Additional Information mL/min/1.69m2    GFR Additional Information      Urea Nitrogen 11 7 - 18 mg/dL    Calcium 8.9 8.5 - 89.8 mg/dL   Magnesium    Collection Time: 06/21/24  5:39 AM   Result Value Ref Range    Magnesium 1.3 (L) 1.5 - 2.0 mEq/L   Phosphorus    Collection Time: 06/21/24  5:39 AM   Result Value Ref Range    Phosphorus 2.7 2.5 - 4.9 mg/dL   CBC    Collection Time: 06/21/24  5:39 AM   Result Value Ref Range    White Blood Cell Count 5.08 4.16 - 9.95 x10E3/uL    Red Blood Cell Count 3.17 (L) 4.41 - 5.95 x10E6/uL    Hemoglobin 9.3 (L) 13.5 - 17.1 g/dL    Hematocrit 71.7 (L) 38.5 - 52.0 %    Mean Corpuscular Volume 89.0 79.3 - 98.6 fL    Mean Corpuscular Hemoglobin 29.3 26.4 - 33.4 pg    MCH Concentration 33.0 31.5 - 35.5 g/dL    Red Cell Distribution Width-SD 47.1 36.9 - 48.3 fL    Red Cell Distribution Width-CV 14.5 11.1 - 15.5 %    Platelet Count, Auto 156 143 - 398 x10E3/uL    Mean Platelet Volume 8.8 (L) 9.3 - 13.0 fL    Nucleated RBC%, automated 0.0 No Ref. Range %    Absolute Nucleated RBC Count 0.00 0.00 - 0.00 x10E3/uL   Differential, Automated    Collection Time: 06/21/24  5:39 AM   Result Value Ref Range    Neutrophil Percent, Auto 63.0 No Ref. Range %  Lymphocyte Percent, Auto 23.8 No Ref. Range %    Monocyte Percent, Auto 11.2 No Ref. Range %    Eosinophil Percent, Auto 1.0 No Ref. Range %    Basophil Percent, Auto 0.6 No Ref. Range %    Immature Granulocytes% 0.4 No Reference Range %    Absolute Neut Count 3.20 1.80 - 6.90 x10E3/uL    Absolute Lymphocyte Count 1.21 (L) 1.30 - 3.40 x10E3/uL    Absolute Mono Count 0.57 0.20 - 0.80 x10E3/uL    Absolute Eos Count 0.05 0.00 - 0.50 x10E3/uL    Absolute Baso Count 0.03 0.00 - 0.10 x10E3/uL    Absolute Immature Gran Count <0.03 0.00 - 0.04 x10E3/uL   Wheeled Walker    Collection Time: 06/21/24  7:59 AM   Result Value Ref Range    Supplier Name [employer] [id number]     Delivery Status Ready For Delivery     Delivery Note      Delivery Date Requested 06/21/2024     Expected Delivery Date 06/21/2024     Item Description Glenys Finder, Adult         06/20/24  Assessment:  #Prosthetic joint infection Lt knee  #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  #DM2  #Hyperlipidemia  #BPH  #Ulcerative colitis      Plan:  POD1  Postop care per ortho: pain control, PT/OT, DVT prophylaxis  Empiric antibiotics in the form of IV vancomycin and cefepime pending intra-op cultures  ID consult pending  Cont to hold jardiance; cover with SSI  Cont atorvastatin  Hold azathioprine; cont mesalamine  PRN hydralazine for adequate BP control  Medically stable; will cont to follow      06/21/24  Preliminary intra-op cultures growing Staphlococcus lugdunensis  On IV vanco & ceftriaxone per ID  Cont to hold azathioprine; cont mesalamine  SSI  Cont atorvastatin  Medically stable with stable labs; will cont to follow                 Ollis FABIENE Grew, MD

## 2024-06-21 NOTE — Other
 Neuro: Within Defined Limits   BMAT: Level 3 - Yellow  Assistive Device: Walker    Vitals:    Temp:  [36.5 ?C (97.7 ?F)-37.3 ?C (99.1 ?F)] 36.9 ?C (98.4 ?F)  Heart Rate:  [70-74] 71  Resp:  [17] 17  BP: (112-143)/(60-73) 119/69  NBP Mean:  [78-94] 86  SpO2:  [93 %-96 %] 96 %  Cardiac:     Ectopy:   Respiratory:  None (Room air)        Pain:    Last BM: 06/20/24  GU: Voiding  Skin: Within Defined Limits  Lines/Tubes/Drains:   Peripheral IV 20 G Anterior;Right Wrist (2)  Negative Pressure Wound Therapy Thigh  Surgical Drain Anterior;Left Knee Bulb (2)         Expected Discharge Disposition: Home w/Home Health     Expected Discharge Date: 06/21/2024   Patient's Clinical Goal:   Clinical Goal(s) for the Shift: VSS, Pain  Identify possible barriers to advancing the care plan:   Stability of the patient:    Progression of Patient's Clinical Goal:   Patient is alert and oriented x4 vitals are stable and afebrile. Mag replaced 2g. PRN pain meds given. Vanco given. Stool softeners given. Patient ambulated to restroom. He is free from falls and injury throughout shift. All care needs met. Continuing with plan of care.

## 2024-06-22 LAB — Bacterial Culture-Gm Stain
GRAM STAIN (GENERAL): NONE SEEN
GRAM STAIN (GENERAL): NONE SEEN

## 2024-06-22 LAB — Basic Metabolic Panel: ESTIMATED GFR 2021 CKD-EPI: >89 mL/min/1.73m2 (ref 21–32)

## 2024-06-22 LAB — Glucose,POC
GLUCOSE,POC: 193 mg/dL — ABNORMAL HIGH (ref 65–99)
GLUCOSE,POC: 177 mg/dL — ABNORMAL HIGH (ref 65–99)
GLUCOSE,POC: 157 mg/dL — ABNORMAL HIGH (ref 65–99)
GLUCOSE,POC: 124 mg/dL — ABNORMAL HIGH (ref 65–99)

## 2024-06-22 LAB — Vancomycin,trough: VANCOMYCIN,TROUGH: 8.2 ug/mL — ABNORMAL LOW (ref 10.0–20.0)

## 2024-06-22 MED ADMIN — CEFTRIAXONE 2 G IN SWFI IVP: 2 g | INTRAVENOUS | Stop: 2024-06-23 | NDC 00409733513

## 2024-06-22 MED ADMIN — EMPAGLIFLOZIN 10 MG PO TABS: 10 mg | ORAL | @ 15:00:00 | Stop: 2024-06-25 | NDC 00597015237

## 2024-06-22 MED ADMIN — CETIRIZINE HCL 10 MG PO TABS: 10 mg | ORAL | @ 15:00:00 | Stop: 2024-06-25 | NDC 51079059701

## 2024-06-22 MED ADMIN — VANCOMYCIN 1 G IN NS IVPB MB: 1 g | INTRAVENOUS | @ 01:00:00 | Stop: 2024-06-22 | NDC 00338004902

## 2024-06-22 MED ADMIN — CEFTRIAXONE 2 G IN SWFI IVP: 2 g | INTRAVENOUS | @ 01:00:00 | Stop: 2024-06-23 | NDC 00409733513

## 2024-06-22 MED ADMIN — ZINC SULFATE 220 (50 ZN) MG PO CAPS: 50 mg | ORAL | @ 15:00:00 | Stop: 2024-06-25

## 2024-06-22 MED ADMIN — VANCOMYCIN HCL IN NACL 1.25-0.9 GM/250ML-% IV SOLN: 1.25 g | INTRAVENOUS | @ 19:00:00 | Stop: 2024-06-23 | NDC 25021015799

## 2024-06-22 MED ADMIN — CETIRIZINE HCL 10 MG PO TABS: 10 mg | ORAL | @ 02:00:00 | Stop: 2024-06-25 | NDC 51079059701

## 2024-06-22 MED ADMIN — RIVAROXABAN 20 MG PO TABS: 10 mg | ORAL | @ 01:00:00 | Stop: 2024-06-25 | NDC 50458057910

## 2024-06-22 MED ADMIN — ATORVASTATIN CALCIUM 10 MG PO TABS: 10 mg | ORAL | @ 04:00:00 | Stop: 2024-06-25 | NDC 68084009711

## 2024-06-22 MED ADMIN — DOCUSATE SODIUM 100 MG PO CAPS: 100 mg | ORAL | @ 04:00:00 | Stop: 2024-06-25 | NDC 00904728080

## 2024-06-22 MED ADMIN — TEMAZEPAM 7.5 MG PO CAPS: 7.5 mg | ORAL | @ 04:00:00 | Stop: 2024-06-25 | NDC 68084054911

## 2024-06-22 MED ADMIN — MESALAMINE 1.2 G PO TBEC: 4.8 g | ORAL | @ 17:00:00 | Stop: 2024-06-25

## 2024-06-22 MED ADMIN — RIVAROXABAN 20 MG PO TABS: 10 mg | ORAL | Stop: 2024-06-25 | NDC 50458057910

## 2024-06-22 MED ADMIN — ZINC SULFATE 220 (50 ZN) MG PO CAPS: 50 mg | ORAL | @ 02:00:00 | Stop: 2024-06-25

## 2024-06-22 MED ADMIN — DOCUSATE SODIUM 100 MG PO CAPS: 100 mg | ORAL | @ 15:00:00 | Stop: 2024-06-25 | NDC 00904728080

## 2024-06-22 MED ADMIN — VANCOMYCIN HCL IN NACL 1.25-0.9 GM/250ML-% IV SOLN: 1.25 g | INTRAVENOUS | @ 09:00:00 | Stop: 2024-06-23 | NDC 67457034001

## 2024-06-22 NOTE — Progress Notes
 INFECTIOUS DISEASE PROGRESS NOTE:    Patient Name: SONIA STICKELS  MRN: [id number]    Room#: 404/1  Date: 06/22/2024     Hospital Day: 3   Primary Prob: <principal problem not specified>  Attending MD: Maurene Gwenn SQUIBB., MD       SUBJECTIVE:   ID: ALPHONS BURGERT is a 73 y.o. 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, procedure,,   I and D of left knee, extraction of infection prosthesis, and abx cement placement,   Events/Complaints:  doing okay    OBJECTIVE:  Vitals signs:   Temp:  [36.5 ?C (97.7 ?F)-37.3 ?C (99.1 ?F)] 36.6 ?C (97.9 ?F)  Heart Rate:  [68-72] 68  Resp:  [17-18] 18  BP: (119-147)/(59-73) 147/71  NBP Mean:  [79-97] 97  SpO2:  [95 %-98 %] 97 %      PHYSICAL EXAM:    NAD, alert and oriented times three  Pink conjunctiva, anicteric, NO CLAD  RRR, no murmurs  CTAB, no crackles or wheezes  Soft, non-tender abdomen  Left knee bandaged and left leg swollen  Neuro: non-focal    LABORATORY DATA (REVIEWED):     Recent Labs     06/21/24  0539 06/20/24  0608   WBC 5.08 6.73   HGB 9.3* 9.8*   HCT 28.2* 29.9*   MCV 89.0 89.3   PLT 156 160     Recent Labs     06/22/24  0416 06/21/24  0539 06/20/24  0608   NA 140 137 137   K 3.8 3.6 4.0   CL 105 106 107   CO2 31 30 27    BUN 11 11 18    CREAT 0.50* 0.40* 0.60*   CALCIUM 8.8 8.9 9.3   MG  --  1.3*  --    PHOS  --  2.7  --      estimated creatinine clearance is 122.4 mL/min (A) (by C-G formula based on SCr of 0.5 mg/dL (L)).    Recent Labs     06/20/24  0608   ALBUMIN 2.7*       MICROBIOLOGY:  Recent Results (from the past 6 weeks)   MRSA Surveillance, Nares    Collection Time: 06/19/24  9:00 AM    Specimen: Nares; Respiratory, Upper   Result Value Ref Range    MRSA Surveillance       No Methicillin-resistant Staphylococcus aureus isolated.   Fungal Culture, Tissue    Collection Time: 06/19/24  1:46 PM    Specimen: Knee, Left; Tissue   Result Value Ref Range    Fungal Culture Negative To Date    Bacterial Culture-Gm Stain, Tissue    Collection Time: 06/19/24  1:46 PM Specimen: Knee, Left; Tissue   Result Value Ref Range    Bacterial Aerobic Culture Rare Staphylococcus lugdunensis (AA)     Gram Stain No bacteria seen.        Susceptibility    Staphylococcus lugdunensis - MIC (MCG/ML)*     Penicillin 0.25 Resistant      Clindamycin <=0.12 Susceptible      Oxacillin 2 Susceptible      Vancomycin <=0.5 Susceptible      * Beta-lactam therapy is preferred for infections due to Staphylococcus lugdunensis that test susceptible to the Beta-lactams.    Oxacillin susceptible Staphylococci are susceptible to all penicillinase-stable penicillins (including nafcillin), cefazolin and cephalexin.   Bacterial Anaerobic Culture, Tissue    Collection Time: 06/19/24  1:46 PM    Specimen: Knee, Left; Tissue  Result Value Ref Range    Anaerobic Culture Negative To Date    Fungal Culture, Tissue    Collection Time: 06/19/24  1:47 PM    Specimen: Knee, Left; Tissue   Result Value Ref Range    Fungal Culture Negative To Date    Bacterial Culture-Gm Stain, Tissue    Collection Time: 06/19/24  1:47 PM    Specimen: Knee, Left; Tissue   Result Value Ref Range    Bacterial Aerobic Culture Rare Staphylococcus lugdunensis (AA)     Gram Stain No bacteria seen.     Gram Stain Few WBC    Bacterial Anaerobic Culture, Tissue    Collection Time: 06/19/24  1:47 PM    Specimen: Knee, Left; Tissue   Result Value Ref Range    Anaerobic Culture Negative To Date    Fungal Culture, Tissue    Collection Time: 06/19/24  1:47 PM    Specimen: Knee, Left; Tissue   Result Value Ref Range    Fungal Culture Negative To Date    Bacterial Culture-Gm Stain, Tissue    Collection Time: 06/19/24  1:47 PM    Specimen: Knee, Left; Tissue   Result Value Ref Range    Bacterial Aerobic Culture Few Staphylococcus lugdunensis (AA)     Gram Stain No bacteria seen.    Bacterial Anaerobic Culture, Tissue    Collection Time: 06/19/24  1:47 PM    Specimen: Knee, Left; Tissue   Result Value Ref Range    Anaerobic Culture Negative To Date    Fungal Culture, Tissue    Collection Time: 06/19/24  1:47 PM    Specimen: Knee, Left; Tissue   Result Value Ref Range    Fungal Culture Negative To Date    Bacterial Culture-Gm Stain, Tissue    Collection Time: 06/19/24  1:47 PM    Specimen: Knee, Left; Tissue   Result Value Ref Range    Bacterial Aerobic Culture Negative To Date     Gram Stain No bacteria seen.    Bacterial Anaerobic Culture, Tissue    Collection Time: 06/19/24  1:47 PM    Specimen: Knee, Left; Tissue   Result Value Ref Range    Anaerobic Culture Negative To Date        MEDICATIONS:  cefTRIAXone in SWFI 100 mg/mL IV inj (from 2 g vial)  doxycycline  hyclate  Vancomycin HCl in NaCl    RADIOLOGY:   No imaging has been resulted in the last 24 hours     ASSESSMENT /PLAN:  06/21/24  Stable, afebrile, on room air,   Post op s/p extraction of infected left knee hardware, and cement placement,   Surgical cx staph lugdunensis,, sensivity pending,   If oxacillin sensitive, --- pt can be transitioned to IV cefazoline 2g IV q8 for 6 weeks, discussed with [place] microbiology, cx will finalize on 06/22/24  Hydration, supportive tx,   ID consult will fu,     06/23/2023  Monitor the labs and the temperature.  Continue supportive care.  Taper antibiotics to ancef.  S lugdunensis was oxacilllin sensitive.  Long discussion with the patient.        Jonia Oakey J. Andrika Peraza, MD

## 2024-06-22 NOTE — Other
 Neuro: Within Defined Limits   BMAT: Level 3 - Yellow  Assistive Device: Walker    Vitals:    Temp:  [36.5 ?C (97.7 ?F)-37.2 ?C (99 ?F)] 37.1 ?C (98.8 ?F)  Heart Rate:  [65-71] 70  Resp:  [17-18] 18  BP: (119-147)/(59-72) 124/72  NBP Mean:  [79-97] 89  SpO2:  [95 %-98 %] 96 %  Cardiac:     Ectopy:   Respiratory:           Pain:    Last BM: 06/20/24  GU: Voiding  Skin: Within Defined Limits  Lines/Tubes/Drains:   Peripheral IV 22 G Right Antecubital (1)  Negative Pressure Wound Therapy Thigh  Surgical Drain Anterior;Left Knee Bulb (3)         Expected Discharge Disposition: Home w/Home Health     Expected Discharge Date: 06/21/2024   Patient's Clinical Goal:   Clinical Goal(s) for the Shift: VSS, Pain  Identify possible barriers to advancing the care plan:   Stability of the patient: Moderately Stable - low risk of patient condition declining or worsening   Progression of Patient's Clinical Goal:   Patient is alert and oriented x4 vitals are stable and afebrile. Patient had PT today Ambulated to restroom. PRN pain meds given. IV abx given. Accucheck maintained. He is free from falls and injury throughout shift. All care needs met. Continuing with plan of care.

## 2024-06-22 NOTE — Progress Notes
 Patient Name: Daryl Graves MRN: [id number]   Age: 73 y.o.  Date of Birth: 1951-02-09   Date of Service: 06/22/2024     Chief Complaint: Lt knee pain    Physical Examination:    Vital Signs: BP 130/68 (Patient Position: Lying)  ~ Pulse 67  ~ Temp 36.6 ?C (97.9 ?F) (Oral)  ~ Resp 18  ~ Ht 1.854 m (6' 1'')  ~ Wt (!) 106.8 kg (235 lb 6.4 oz)  ~ SpO2 95%  ~ BMI 31.06 kg/m?     I/Os:   Intake/Output Summary (Last 24 hours) at 06/22/2024 1240  Last data filed at 06/22/2024 0742  Gross per 24 hour   Intake 375 ml   Output 775 ml   Net -400 ml        General appearance: Alert, oriented, no acute distress  HEENT: Normal, Atraumatic, normocephalic, PERRLA   Neck:   no carotid bruit and no JVD  Cardiovascular: Regular rate and rhythm and no murmurs   Lungs:   clear to auscultation bilaterally  Abdomen: abdomen is soft without significant tenderness, masses, organomegaly or guarding  Extremities:   No pedal edema  Dressing Lt knee  Neuro/CNS:   AAOx3, Cranial nerves II to XII WNLs, No motor defeciets, Sensory exam normal    Current Medications:   Current Facility-Administered Medications   Medication Dose Route Frequency    acetaminophen  tab 650 mg  650 mg Oral Q6H PRN    Or    acetaminophen  650 mg supp 650 mg  650 mg Rectal Q6H PRN    aluminum-magnesium hydroxide-simethicone 400-400-40 mg/5 mL susp 30 mL  30 mL Oral Q6H PRN    atorvastatin tab 10 mg  10 mg Oral Nightly    bisacodyl EC tab 5 mg  5 mg Oral Daily PRN    calcium gluconate 2 g in sodium chloride 100 mL IVPB RTU  2 g Intravenous PRN    cefTRIAXone 2 g in sterile water PF 20 mL IV injection  2 g IV Push Q24H    cetirizine tab 10 mg  10 mg Oral Daily    insulin aspart (NovoLOG) 100 units/mL inj vial   Subcutaneous QAC & QHS PRN    And    dextrose 50% inj 25 g  25 g IV Push PRN    docusate cap 100 mg  100 mg Oral BID    empagliflozin tab 10 mg  10 mg Oral QAM    hydrALAZINE tab 25 mg  25 mg Oral Q6H PRN    oxyCODONE tab 5 mg  5 mg Oral Q4H PRN    Or    HYDROmorphone 1 mg/mL inj 0.5 mg  0.5 mg IV Push Q4H PRN    lactulose 10 g/15 mL soln 20 g  20 g Oral TID PRN    magnesium hydroxide 400 mg/5 mL susp 30 mL  30 mL Oral Daily PRN    magnesium sulfate 2 g in water for injection 50 mL RTU  2 g Intravenous PRN    Or    magnesium sulfate 4 g in water for injection 100 mL RTU  4 g Intravenous PRN    mesalamine (Lialda) DR tab 4.8 g  4.8 g Oral Daily with breakfast    naloxone 0.4 mg/mL inj 0.1 mg  0.1 mg IV Push Q5 min PRN    ondansetron tab 4 mg  4 mg Oral Q8H PRN    Or    ondansetron 4 mg/2 mL inj  4 mg  4 mg Intravenous Q8H PRN    oxyCODONE tab 5 mg  5 mg Oral Q4H PRN    Or    oxyCODONE tab 10 mg  10 mg Oral Q4H PRN    pneumococcal 20-valent vaccine (Prevnar 20) inj 0.5 mL  0.5 mL Intramuscular Once    potassium chloride 10 mEq in water for injection 100 mL IVPB RTU  10 mEq Intravenous PRN    Or    potassium chloride 40 mEq/500 mL IVPB RTU (peripheral) (batched)  40 mEq Intravenous PRN    Or    potassium chloride 30 mEq/500 mL IVPB RTU (peripheral) (batched)  30 mEq Intravenous PRN    Or    potassium chloride 40 mEq/500 mL IVPB RTU (peripheral) (batched)  40 mEq Intravenous PRN    potassium chloride ER tab 20 mEq  20 mEq Oral PRN    Or    potassium chloride ER tab 40 mEq  40 mEq Oral PRN    Or    potassium chloride ER tab 60 mEq  60 mEq Oral PRN    Or    potassium chloride ER tab 80 mEq  80 mEq Oral PRN    rivaroxaban tab 10 mg  10 mg Oral Q24H    senna tab 1 tablet  1 tablet Oral QHS PRN    temazepam cap 7.5 mg  7.5 mg Oral QHS PRN    vancomycin 1.25 g in sodium chloride 250 mL IVPB RTU  1.25 g Intravenous Q8H    vancomycin per pharmacy   Does not apply Per Protocol    zinc sulfate heptahydrate cap 50 mg of elemental zinc  50 mg of elemental zinc Oral Daily        (click to expand/collapse)   Labs:   Recent Results (from the past week)   Type and screen (Admission On Day Of Surgery: Nurse Protocol)    Collection Time: 06/19/24  9:00 AM   Result Value Ref Range    Antibody Screen Negative     ABORh A Positive    MRSA Surveillance, Nares    Collection Time: 06/19/24  9:00 AM    Specimen: Nares; Respiratory, Upper   Result Value Ref Range    MRSA Surveillance       No Methicillin-resistant Staphylococcus aureus isolated.   Check type (Admission On Day Of Surgery: Nurse Protocol)    Collection Time: 06/19/24  9:04 AM   Result Value Ref Range    Blood Type Confirmed     ABO/Rh Type A Positive    Fungal Culture, Tissue    Collection Time: 06/19/24  1:46 PM    Specimen: Knee, Left; Tissue   Result Value Ref Range    Fungal Culture Negative To Date    Bacterial Culture-Gm Stain, Tissue    Collection Time: 06/19/24  1:46 PM    Specimen: Knee, Left; Tissue   Result Value Ref Range    Bacterial Aerobic Culture Rare Staphylococcus lugdunensis (AA)     Gram Stain No bacteria seen.        Susceptibility    Staphylococcus lugdunensis - MIC (MCG/ML)*     Penicillin 0.25 Resistant      Clindamycin <=0.12 Susceptible      Oxacillin 2 Susceptible      Vancomycin <=0.5 Susceptible      * Beta-lactam therapy is preferred for infections due to Staphylococcus lugdunensis that test susceptible to the Beta-lactams.    Oxacillin susceptible Staphylococci are susceptible  to all penicillinase-stable penicillins (including nafcillin), cefazolin and cephalexin.   Bacterial Anaerobic Culture, Tissue    Collection Time: 06/19/24  1:46 PM    Specimen: Knee, Left; Tissue   Result Value Ref Range    Anaerobic Culture Negative To Date    Fungal Culture, Tissue    Collection Time: 06/19/24  1:47 PM    Specimen: Knee, Left; Tissue   Result Value Ref Range    Fungal Culture Negative To Date    Bacterial Culture-Gm Stain, Tissue    Collection Time: 06/19/24  1:47 PM    Specimen: Knee, Left; Tissue   Result Value Ref Range    Bacterial Aerobic Culture Rare Staphylococcus lugdunensis (AA)     Gram Stain No bacteria seen.     Gram Stain Few WBC    Bacterial Anaerobic Culture, Tissue    Collection Time: 06/19/24  1:47 PM    Specimen: Knee, Left; Tissue   Result Value Ref Range    Anaerobic Culture Negative To Date    Fungal Culture, Tissue    Collection Time: 06/19/24  1:47 PM    Specimen: Knee, Left; Tissue   Result Value Ref Range    Fungal Culture Negative To Date    Bacterial Culture-Gm Stain, Tissue    Collection Time: 06/19/24  1:47 PM    Specimen: Knee, Left; Tissue   Result Value Ref Range    Bacterial Aerobic Culture Few Staphylococcus lugdunensis (AA)     Gram Stain No bacteria seen.    Bacterial Anaerobic Culture, Tissue    Collection Time: 06/19/24  1:47 PM    Specimen: Knee, Left; Tissue   Result Value Ref Range    Anaerobic Culture Negative To Date    Fungal Culture, Tissue    Collection Time: 06/19/24  1:47 PM    Specimen: Knee, Left; Tissue   Result Value Ref Range    Fungal Culture Negative To Date    Bacterial Culture-Gm Stain, Tissue    Collection Time: 06/19/24  1:47 PM    Specimen: Knee, Left; Tissue   Result Value Ref Range    Bacterial Aerobic Culture Rare Staphylococcus lugdunensis (AA)     Gram Stain No bacteria seen.    Bacterial Anaerobic Culture, Tissue    Collection Time: 06/19/24  1:47 PM    Specimen: Knee, Left; Tissue   Result Value Ref Range    Anaerobic Culture Negative To Date    POCT glucose    Collection Time: 06/19/24  2:50 PM   Result Value Ref Range    Glucose, Point of Care 165 (H) 65 - 99 mg/dL   POCT glucose    Collection Time: 06/19/24  6:33 PM   Result Value Ref Range    Glucose, Point of Care 172 (H) 65 - 99 mg/dL   POCT glucose    Collection Time: 06/19/24  9:11 PM   Result Value Ref Range    Glucose, Point of Care 210 (H) 65 - 99 mg/dL   C-reactive protein    Collection Time: 06/20/24  6:08 AM   Result Value Ref Range    C-Reactive Protein 5.2 (H) <0.3 mg/dL   Sedimentation rate, westergren    Collection Time: 06/20/24  6:08 AM   Result Value Ref Range    Sedimentation rate, Erythrocyte 32 (H) <=12 mm/hr   Albumin    Collection Time: 06/20/24  6:08 AM   Result Value Ref Range    Albumin 2.7 (L) 3.4 - 5.0 g/dL  Basic Metabolic Panel    Collection Time: 06/20/24  6:08 AM   Result Value Ref Range    Sodium 137 136 - 145 mmol/L    Potassium 4.0 3.5 - 5.1 mmol/L    Chloride 107 98 - 107 mmol/L    Total CO2 27 21 - 32 mmol/L    Anion Gap 3 (L) 8 - 19 mmol/L    Glucose 156 (H) 74 - 106 mg/dL    Creatinine 9.39 (L) 0.70 - 1.30 mg/dL    Estimated GFR >10 See GFR Additional Information mL/min/1.22m2    GFR Additional Information      Urea Nitrogen 18 7 - 18 mg/dL    Calcium 9.3 8.5 - 89.8 mg/dL   CBC    Collection Time: 06/20/24  6:08 AM   Result Value Ref Range    White Blood Cell Count 6.73 4.16 - 9.95 x10E3/uL    Red Blood Cell Count 3.35 (L) 4.41 - 5.95 x10E6/uL    Hemoglobin 9.8 (L) 13.5 - 17.1 g/dL    Hematocrit 70.0 (L) 38.5 - 52.0 %    Mean Corpuscular Volume 89.3 79.3 - 98.6 fL    Mean Corpuscular Hemoglobin 29.3 26.4 - 33.4 pg    MCH Concentration 32.8 31.5 - 35.5 g/dL    Red Cell Distribution Width-SD 46.6 36.9 - 48.3 fL    Red Cell Distribution Width-CV 14.3 11.1 - 15.5 %    Platelet Count, Auto 160 143 - 398 x10E3/uL    Mean Platelet Volume 8.8 (L) 9.3 - 13.0 fL    Nucleated RBC%, automated 0.0 No Ref. Range %    Absolute Nucleated RBC Count 0.00 0.00 - 0.00 x10E3/uL   Differential, Automated    Collection Time: 06/20/24  6:08 AM   Result Value Ref Range    Neutrophil Percent, Auto 71.4 No Ref. Range %    Lymphocyte Percent, Auto 16.8 No Ref. Range %    Monocyte Percent, Auto 11.0 No Ref. Range %    Eosinophil Percent, Auto 0.3 No Ref. Range %    Basophil Percent, Auto 0.1 No Ref. Range %    Immature Granulocytes% 0.4 No Reference Range %    Absolute Neut Count 4.80 1.80 - 6.90 x10E3/uL    Absolute Lymphocyte Count 1.13 (L) 1.30 - 3.40 x10E3/uL    Absolute Mono Count 0.74 0.20 - 0.80 x10E3/uL    Absolute Eos Count <0.03 0.00 - 0.50 x10E3/uL    Absolute Baso Count <0.03 0.00 - 0.10 x10E3/uL    Absolute Immature Gran Count 0.03 0.00 - 0.04 x10E3/uL   POCT glucose    Collection Time: 06/20/24  7:29 AM Result Value Ref Range    Glucose, Point of Care 141 (H) 65 - 99 mg/dL   POCT glucose    Collection Time: 06/20/24  5:30 PM   Result Value Ref Range    Glucose, Point of Care 236 (H) 65 - 99 mg/dL   POCT glucose    Collection Time: 06/20/24  9:27 PM   Result Value Ref Range    Glucose, Point of Care 151 (H) 65 - 99 mg/dL   Basic Metabolic Panel    Collection Time: 06/21/24  5:39 AM   Result Value Ref Range    Sodium 137 136 - 145 mmol/L    Potassium 3.6 3.5 - 5.1 mmol/L    Chloride 106 98 - 107 mmol/L    Total CO2 30 21 - 32 mmol/L    Anion Gap 1 (L) 8 -  19 mmol/L    Glucose 151 (H) 74 - 106 mg/dL    Creatinine 9.59 (L) 0.70 - 1.30 mg/dL    Estimated GFR >10 See GFR Additional Information mL/min/1.36m2    GFR Additional Information      Urea Nitrogen 11 7 - 18 mg/dL    Calcium 8.9 8.5 - 89.8 mg/dL   Magnesium    Collection Time: 06/21/24  5:39 AM   Result Value Ref Range    Magnesium 1.3 (L) 1.5 - 2.0 mEq/L   Phosphorus    Collection Time: 06/21/24  5:39 AM   Result Value Ref Range    Phosphorus 2.7 2.5 - 4.9 mg/dL   CBC    Collection Time: 06/21/24  5:39 AM   Result Value Ref Range    White Blood Cell Count 5.08 4.16 - 9.95 x10E3/uL    Red Blood Cell Count 3.17 (L) 4.41 - 5.95 x10E6/uL    Hemoglobin 9.3 (L) 13.5 - 17.1 g/dL    Hematocrit 71.7 (L) 38.5 - 52.0 %    Mean Corpuscular Volume 89.0 79.3 - 98.6 fL    Mean Corpuscular Hemoglobin 29.3 26.4 - 33.4 pg    MCH Concentration 33.0 31.5 - 35.5 g/dL    Red Cell Distribution Width-SD 47.1 36.9 - 48.3 fL    Red Cell Distribution Width-CV 14.5 11.1 - 15.5 %    Platelet Count, Auto 156 143 - 398 x10E3/uL    Mean Platelet Volume 8.8 (L) 9.3 - 13.0 fL    Nucleated RBC%, automated 0.0 No Ref. Range %    Absolute Nucleated RBC Count 0.00 0.00 - 0.00 x10E3/uL   Differential, Automated    Collection Time: 06/21/24  5:39 AM   Result Value Ref Range    Neutrophil Percent, Auto 63.0 No Ref. Range %    Lymphocyte Percent, Auto 23.8 No Ref. Range %    Monocyte Percent, Auto 11.2 No Ref. Range %    Eosinophil Percent, Auto 1.0 No Ref. Range %    Basophil Percent, Auto 0.6 No Ref. Range %    Immature Granulocytes% 0.4 No Reference Range %    Absolute Neut Count 3.20 1.80 - 6.90 x10E3/uL    Absolute Lymphocyte Count 1.21 (L) 1.30 - 3.40 x10E3/uL    Absolute Mono Count 0.57 0.20 - 0.80 x10E3/uL    Absolute Eos Count 0.05 0.00 - 0.50 x10E3/uL    Absolute Baso Count 0.03 0.00 - 0.10 x10E3/uL    Absolute Immature Gran Count <0.03 0.00 - 0.04 x10E3/uL   Wheeled Walker    Collection Time: 06/21/24  7:59 AM   Result Value Ref Range    Supplier Name [employer]     Supplier Phone [phone]     Delivery Status Delivery Successful     Delivery Note      Delivery Date Requested 06/21/2024     Actual Delivery Date 06/22/2024     Expected Delivery Date 06/21/2024     Item Description Wheeled Walker, Adult    POCT glucose    Collection Time: 06/21/24 12:49 PM   Result Value Ref Range    Glucose, Point of Care 153 (H) 65 - 99 mg/dL   Vancomycin,trough    Collection Time: 06/21/24  5:16 PM   Result Value Ref Range    Vancomycin,trough 8.2 (L) 10.0 - 20.0 mcg/mL   POCT glucose    Collection Time: 06/21/24  5:26 PM   Result Value Ref Range    Glucose, Point of Care  193 (H) 65 - 99 mg/dL   POCT glucose    Collection Time: 06/21/24  9:00 PM   Result Value Ref Range    Glucose, Point of Care 177 (H) 65 - 99 mg/dL   Basic Metabolic Panel    Collection Time: 06/22/24  4:16 AM   Result Value Ref Range    Sodium 140 136 - 145 mmol/L    Potassium 3.8 3.5 - 5.1 mmol/L    Chloride 105 96 - 106 mmol/L    Total CO2 31 21 - 32 mmol/L    Anion Gap 4 (L) 8 - 19 mmol/L    Glucose 159 (H) 74 - 106 mg/dL    Creatinine 9.49 (L) 0.70 - 1.30 mg/dL    Estimated GFR >10 See GFR Additional Information mL/min/1.45m2    GFR Additional Information      Urea Nitrogen 11 7 - 18 mg/dL    Calcium 8.8 8.5 - 89.8 mg/dL   POCT glucose    Collection Time: 06/22/24  7:19 AM   Result Value Ref Range    Glucose, Point of Care 157 (H) 65 - 99 mg/dL   POCT glucose    Collection Time: 06/22/24 11:54 AM   Result Value Ref Range    Glucose, Point of Care 124 (H) 65 - 99 mg/dL        89/7/74  Assessment:  #Prosthetic joint infection Lt knee  #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  #DM2  #Hyperlipidemia  #BPH  #Ulcerative colitis      Plan:  POD1  Postop care per ortho: pain control, PT/OT, DVT prophylaxis  Empiric antibiotics in the form of IV vancomycin and cefepime pending intra-op cultures  ID consult pending  Cont to hold jardiance; cover with SSI  Cont atorvastatin  Hold azathioprine; cont mesalamine  PRN hydralazine for adequate BP control  Medically stable; will cont to follow      06/21/24  Preliminary intra-op cultures growing Staphlococcus lugdunensis  On IV vanco & ceftriaxone per ID  Cont to hold azathioprine; cont mesalamine  SSI  Cont atorvastatin  Medically stable with stable labs; will cont to follow      06/22/24  Preliminary intra-op cultures growing Staphlococcus lugdunensis sensitive to oxacillin  On IV vanco & ceftriaxone; taper antibiotics per ID  Cont to hold azathioprine; cont mesalamine  SSI  Cont atorvastatin  Medically stable with stable labs; will cont to follow  DC planning: home with home IV abxs                 Shriyans Kuenzi R. Talmage Teaster, MD

## 2024-06-22 NOTE — Other
 Patient's Clinical Goal:   Clinical Goal(s) for the Shift: Comfort, maintain pain control  Identify possible barriers to advancing the care plan: decreased patient rounding  Stability of the patient: Moderately Stable - low risk of patient condition declining or worsening   Progression of Patient's Clinical Goal:   Neuro: Within Defined Limits   BMAT: Level 3 - Yellow  Assistive Device: Walker    Vitals:    Temp:  [36.5 ?C (97.7 ?F)-37.3 ?C (99.1 ?F)] 37.2 ?C (99 ?F)  Heart Rate:  [69-73] 69  Resp:  [17] 17  BP: (119-143)/(59-73) 130/62  NBP Mean:  [79-94] 84  SpO2:  [95 %-98 %] 98 %  Cardiac:     Respiratory:  None (Room air)    Pain: Left;Knee  Last BM: 06/21/24  GU: Voiding  Skin: Intact except incision  Lines/Tubes/Drains:   Peripheral IV 20 G Anterior;Right Wrist (3)  Negative Pressure Wound Therapy Thigh  Surgical Drain Anterior;Left Knee Bulb (3)       Shift Events/Acute Changes:   Continued vancomycin abx treatment. No acute events overnight. Pain level controlled, patient declined pain medication. Patient had large BM. Hourly rounding completed, Call light in reach, bed low/locked; current pain regimen adequate to manage pain; Fall/safety precaution promoted throughout shift. POC to be endorsed to oncoming RN.      To Do/Pending:       Nursing Considerations for Discharge:       Expected Discharge Disposition: Home w/Home Health     Expected Discharge Date: 06/21/2024

## 2024-06-23 LAB — Basic Metabolic Panel: TOTAL CO2: 30 mmol/L (ref 21–32)

## 2024-06-23 LAB — Bacterial Culture-Gm Stain

## 2024-06-23 LAB — Glucose,POC
GLUCOSE,POC: 136 mg/dL — ABNORMAL HIGH (ref 65–99)
GLUCOSE,POC: 151 mg/dL — ABNORMAL HIGH (ref 65–99)
GLUCOSE,POC: 153 mg/dL — ABNORMAL HIGH (ref 65–99)
GLUCOSE,POC: 154 mg/dL — ABNORMAL HIGH (ref 65–99)
GLUCOSE,POC: 157 mg/dL — ABNORMAL HIGH (ref 65–99)

## 2024-06-23 LAB — Differential Automated: ABSOLUTE BASO COUNT: 0.04 x10E3/uL (ref 0.00–0.10)

## 2024-06-23 LAB — CBC: HEMOGLOBIN: 9.8 g/dL — ABNORMAL LOW (ref 13.5–17.1)

## 2024-06-23 MED ADMIN — MESALAMINE 1.2 G PO TBEC: 4.8 g | ORAL | @ 15:00:00 | Stop: 2024-06-25 | NDC 60687039795

## 2024-06-23 MED ADMIN — VANCOMYCIN HCL IN NACL 1.25-0.9 GM/250ML-% IV SOLN: 1.25 g | INTRAVENOUS | @ 19:00:00 | Stop: 2024-06-23 | NDC 67457034001

## 2024-06-23 MED ADMIN — RIVAROXABAN 20 MG PO TABS: 10 mg | ORAL | @ 23:00:00 | Stop: 2024-06-25 | NDC 50458057910

## 2024-06-23 MED ADMIN — DOCUSATE SODIUM 100 MG PO CAPS: 100 mg | ORAL | @ 04:00:00 | Stop: 2024-06-25 | NDC 00904728080

## 2024-06-23 MED ADMIN — VANCOMYCIN HCL IN NACL 1.25-0.9 GM/250ML-% IV SOLN: 1.25 g | INTRAVENOUS | @ 09:00:00 | Stop: 2024-06-23 | NDC 25021015799

## 2024-06-23 MED ADMIN — EMPAGLIFLOZIN 10 MG PO TABS: 10 mg | ORAL | @ 15:00:00 | Stop: 2024-06-25 | NDC 00597015237

## 2024-06-23 MED ADMIN — DOCUSATE SODIUM 100 MG PO CAPS: 100 mg | ORAL | @ 15:00:00 | Stop: 2024-06-25 | NDC 00904728080

## 2024-06-23 MED ADMIN — ATORVASTATIN CALCIUM 10 MG PO TABS: 10 mg | ORAL | @ 04:00:00 | Stop: 2024-06-25 | NDC 68084009711

## 2024-06-23 MED ADMIN — CETIRIZINE HCL 10 MG PO TABS: 10 mg | ORAL | @ 15:00:00 | Stop: 2024-06-25 | NDC 51079059701

## 2024-06-23 MED ADMIN — CEFAZOLIN 2 G IN SWFI IVP (1G X2 VIALS): 2 g | INTRAVENOUS | @ 23:00:00 | Stop: 2024-06-25 | NDC 00409488717

## 2024-06-23 MED ADMIN — ZINC SULFATE 220 (50 ZN) MG PO CAPS: 50 mg | ORAL | @ 15:00:00 | Stop: 2024-06-25

## 2024-06-23 MED ADMIN — VANCOMYCIN HCL IN NACL 1.25-0.9 GM/250ML-% IV SOLN: 1.25 g | INTRAVENOUS | @ 02:00:00 | Stop: 2024-06-23 | NDC 25021015799

## 2024-06-23 NOTE — Progress Notes
 INFECTIOUS DISEASE PROGRESS NOTE:    Patient Name: Daryl Graves  MRN: [id number]    Room#: 404/1  Date: 06/23/2024     Hospital Day: 4   Primary Prob: <principal problem not specified>  Attending MD: Maurene Gwenn SQUIBB., MD       SUBJECTIVE:   ID: Daryl Graves is a 73 y.o. 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, procedure,,   I and D of left knee, extraction of infection prosthesis, and abx cement placement,   Events/Complaints:  doing okay    OBJECTIVE:  Vitals signs:   Temp:  [36.6 ?C (97.9 ?F)-37.1 ?C (98.8 ?F)] 36.9 ?C (98.4 ?F)  Heart Rate:  [25-70] 68  Resp:  [17-18] 17  BP: (104-138)/(49-78) 130/62  NBP Mean:  [67-93] 85  SpO2:  [95 %-98 %] 97 %      PHYSICAL EXAM:    NAD, alert and oriented times three  Pink conjunctiva, anicteric, NO CLAD  RRR, no murmurs  CTAB, no crackles or wheezes  Soft, non-tender abdomen  Left knee bandaged and left leg swollen  Neuro: non-focal    LABORATORY DATA (REVIEWED):     Recent Labs     06/23/24  0501 06/21/24  0539   WBC 4.07* 5.08   HGB 9.8* 9.3*   HCT 29.9* 28.2*   MCV 90.1 89.0   PLT 197 156     Recent Labs     06/23/24  0501 06/22/24  0416 06/21/24  0539   NA 138 140 137   K 3.7 3.8 3.6   CL 106 105 106   CO2 30 31 30    BUN 11 11 11    CREAT 0.50* 0.50* 0.40*   CALCIUM 9.1 8.8 8.9   MG  --   --  1.3*   PHOS  --   --  2.7     estimated creatinine clearance is 122.4 mL/min (A) (by C-G formula based on SCr of 0.5 mg/dL (L)).    No results for input(s): ''TOTPRO'', ''ALBUMIN'', ''BILITOT'', ''BILICON'', ''ALT'', ''AST'', ''ALKPHOS'', ''GGT'', ''AMYLASE'', ''LIPASE'' in the last 72 hours.      MICROBIOLOGY:  Recent Results (from the past 6 weeks)   MRSA Surveillance, Nares    Collection Time: 06/19/24  9:00 AM    Specimen: Nares; Respiratory, Upper   Result Value Ref Range    MRSA Surveillance       No Methicillin-resistant Staphylococcus aureus isolated.   Fungal Culture, Tissue    Collection Time: 06/19/24  1:46 PM    Specimen: Knee, Left; Tissue   Result Value Ref Range    Fungal Culture Negative To Date    Bacterial Culture-Gm Stain, Tissue    Collection Time: 06/19/24  1:46 PM    Specimen: Knee, Left; Tissue   Result Value Ref Range    Bacterial Aerobic Culture Rare Staphylococcus lugdunensis (AA)     Bacterial Aerobic Culture Rare Staphylococcus lugdunensis (AA)     Gram Stain No bacteria seen.        Susceptibility    Staphylococcus lugdunensis - MIC (MCG/ML)*     Penicillin 0.25 Resistant      Clindamycin <=0.12 Susceptible      Oxacillin 2 Susceptible      Vancomycin <=0.5 Susceptible     Staphylococcus lugdunensis - MIC (MCG/ML)*     Penicillin 0.12 Susceptible      Clindamycin <=0.12 Susceptible      Oxacillin 0.5 Susceptible      Vancomycin <=0.5 Susceptible      *  Beta-lactam therapy is preferred for infections due to Staphylococcus lugdunensis that test susceptible to the Beta-lactams.    Oxacillin susceptible Staphylococci are susceptible to all penicillinase-stable penicillins (including nafcillin), cefazolin and cephalexin.     Beta-lactam therapy is preferred for infections due to Staphylococcus lugdunensis that test susceptible to the Beta-lactams.    Oxacillin susceptible Staphylococci are susceptible to all penicillinase-stable penicillins (including nafcillin), cefazolin and cephalexin.   Bacterial Anaerobic Culture, Tissue    Collection Time: 06/19/24  1:46 PM    Specimen: Knee, Left; Tissue   Result Value Ref Range    Anaerobic Culture Negative To Date    Fungal Culture, Tissue    Collection Time: 06/19/24  1:47 PM    Specimen: Knee, Left; Tissue   Result Value Ref Range    Fungal Culture Negative To Date    Bacterial Culture-Gm Stain, Tissue    Collection Time: 06/19/24  1:47 PM    Specimen: Knee, Left; Tissue   Result Value Ref Range    Bacterial Aerobic Culture Rare Staphylococcus lugdunensis (AA)     Bacterial Aerobic Culture Rare Staphylococcus lugdunensis (AA)     Gram Stain No bacteria seen.     Gram Stain Few WBC    Bacterial Anaerobic Culture, Tissue    Collection Time: 06/19/24  1:47 PM    Specimen: Knee, Left; Tissue   Result Value Ref Range    Anaerobic Culture Negative To Date    Fungal Culture, Tissue    Collection Time: 06/19/24  1:47 PM    Specimen: Knee, Left; Tissue   Result Value Ref Range    Fungal Culture Negative To Date    Bacterial Culture-Gm Stain, Tissue    Collection Time: 06/19/24  1:47 PM    Specimen: Knee, Left; Tissue   Result Value Ref Range    Bacterial Aerobic Culture Few Staphylococcus lugdunensis (AA)     Bacterial Aerobic Culture Few Staphylococcus lugdunensis (AA)     Gram Stain No bacteria seen.    Bacterial Anaerobic Culture, Tissue    Collection Time: 06/19/24  1:47 PM    Specimen: Knee, Left; Tissue   Result Value Ref Range    Anaerobic Culture Negative To Date    Fungal Culture, Tissue    Collection Time: 06/19/24  1:47 PM    Specimen: Knee, Left; Tissue   Result Value Ref Range    Fungal Culture Negative To Date    Bacterial Culture-Gm Stain, Tissue    Collection Time: 06/19/24  1:47 PM    Specimen: Knee, Left; Tissue   Result Value Ref Range    Bacterial Aerobic Culture Rare Staphylococcus lugdunensis (AA)     Gram Stain No bacteria seen.    Bacterial Anaerobic Culture, Tissue    Collection Time: 06/19/24  1:47 PM    Specimen: Knee, Left; Tissue   Result Value Ref Range    Anaerobic Culture Negative To Date        MEDICATIONS:  ceFAZolin in SWFI 100 mg/mL IV inj (from 1 g vial)  doxycycline  hyclate    RADIOLOGY:   No imaging has been resulted in the last 24 hours     ASSESSMENT /PLAN:  06/21/24  Stable, afebrile, on room air,   Post op s/p extraction of infected left knee hardware, and cement placement,   Surgical cx staph lugdunensis,, sensivity pending,   If oxacillin sensitive, --- pt can be transitioned to IV cefazoline 2g IV q8 for 6 weeks, discussed with [place] microbiology, cx will finalize  on 06/22/24  Hydration, supportive tx,   ID consult will fu,     06/23/2023  Monitor the labs and the temperature.  Continue supportive care.  Taper antibiotics to ancef.  S lugdunensis was oxacilllin sensitive.  Long discussion with the patient.      06/23/2024  Continue ancef.  Discussed with Dr Phil. Monitor the labs. Monitor for fever.     Heloise Gordan J. Raidon Swanner, MD

## 2024-06-23 NOTE — Other
 Patient's Clinical Goal:   Clinical Goal(s) for the Shift: Ambulate, Pain management  Identify possible barriers to advancing the care plan: Lack of ambulation, home abx not arranged  Stability of the patient: Moderately Stable - low risk of patient condition declining or worsening   Progression of Patient's Clinical Goal: Pt oriented x4. Pt on RA. Pt SBA/BMAT 3 w/ walker. Pt ambulated up to the RN station with physical therapy. Dr. Maurene changed pt's left knee dressing. Pt now on Cefazolin. Plan is still to go home on home abx. Pt saline lock. Vitals are stable. Bed alarm on. Call light within reach. Safety rounds performed.

## 2024-06-23 NOTE — Progress Notes
 Physical Therapy Treatment      PATIENT: Daryl Graves  MRN: [id number]    Treatment Date: 06/23/2024    Patient Presentation: Position: In bed;Family/CG present  Lines/devices Drains: HLIV;JP Drain/s    Pertinent Updates: Pt cleared to participate in PT by RN and was eager to try walking. He has instructions from MD to limit his L knee flexion ROM d/t post-op precautions. He was able to ambulate about 40 feet with FWW and TTWB.    Precautions   Precautions: Fall risk;Wound;Other (Comment) (Limit L knee flexion)  Orthotic: None  Current Activity Order: Activity as tolerated  Weight Bearing Status: Touch Down/Toe Touch Weight Bearing    Cognition   Cognition: Within Defined Limits  Safety Awareness: Fair awareness of safety precautions  Barriers to Learning: None    Bed Mobility   Supine Scooting: Not Performed  Rolling: Not Performed  Supine to Sit: Stand by Assist  Sit to Supine: Stand by Assist    Functional Mobility   Sit to Stand: Stand by Assist  Ambulation: Stand by Assist  Ambulation Distance (Feet): 40  Gait Pattern: Step to Gait  Assistive Device: Front wheeled walker  Wheelchair: Not Performed  Stairs: Not Performed     Balance   Sitting - Static: Good  Sitting - Dynamic: Good  Standing - Static: Fair;with UE support  Standing - Dynamic: Fair;with UE support    Outcome Measures                                                   AM-PAC   AM-PAC Basic Mobility Raw Score: 18  AM-PAC Basic Mobility t-Scale Score: 41.05  AM-PAC Basic Mobility CMS 0-100% Score: 40.47 %    Neurological (if indicated)   Neuro Deficits: No    Exercises         Total Knee (if indicated)        Pain Assessment   Patient complains of pain: Yes  Pain Quality: Aching  Pain Scale Used: Numeric Pain Scale  Pain Intensity: 4/10;5/10  Pain Location: Left;Knee  Action Taken: Nursing notified                             Patient Status   Activity Tolerance: Good  Oxygen Needs: Room Air  Response to Treatment: Tolerated treatment well;Vital signs stable;Pain;with activity;Resolved with rest  Compliance with Precautions: Good  Call light in reach: Yes  Presentation post treatment: In bed;Family/CG present    Interdisciplinary Communication   Interdisciplinary Communication: Nurse    Treatment Plan   Continue PT Treatment Plan with Focus on: Bed mobility training;Transfer training;Gait training;Range of motion;Therapeutic exercise;Education on precautions;Patient/family/caregiver education and training  I agree with the contents of: Treatment    PT Recommendations   Discharge Recommendation: Physical Therapy;Would benefit from continued therapy;Would benefit from intensive therapy upon discharge;Able to tolerate 3+ hours of therapy per day  Discharge concerns: Requires supervision for mobility;Requires supervision for self care;Requires assistance for mobility;Requires assistance for self care  Discharge Equipment Recommended: Defer to discharge facility;If patient dc home instead of rehab as recommended;Walker;Tub Bench;Commode    Treatment Completed by: Abby Harding Clines, PT

## 2024-06-23 NOTE — Progress Notes
 Today is 4 Days Post-Op s/p Left TKR explant + ABX spacer   Patient complains of some minor pain and nausea, but has tolerated PO intake.   Pain is tolerable on oral meds , has stood and ambulated with PT        Lab Results   Component Value Date    HGB 9.8 (L) 06/23/2024      Micro - sensitive S. Lugden    PE - Left KNEE - bandage CDI  Drain expected SS drainage      +EHL/FHL     Neurovasculary intact          A/P     Continue PT with TTWB    DC home after ID evals, and home ABX is set up  F/U in office in 10-14 days for poss drain removal

## 2024-06-23 NOTE — Nursing Note
 Blood sugar 170. Patient refused insulin. Discussed medication with patient and importance of maintaining blood sugar within normal to promote wound healing. Patient understood but still refused insulin.

## 2024-06-23 NOTE — Progress Notes
 Patient Name: Daryl Graves MRN: [id number]   Age: 73 y.o.  Date of Birth: 11-25-1950   Date of Service: 06/23/2024     Chief Complaint: Lt knee pain is better    Physical Examination:    Vital Signs: BP 104/49 (Patient Position: Lying)  ~ Pulse 65  ~ Temp 36.9 ?C (98.4 ?F) (Oral)  ~ Resp 18  ~ Ht 1.854 m (6' 1'')  ~ Wt (!) 106.8 kg (235 lb 6.4 oz)  ~ SpO2 98%  ~ BMI 31.06 kg/m?     I/Os:   Intake/Output Summary (Last 24 hours) at 06/23/2024 1240  Last data filed at 06/23/2024 1033  Gross per 24 hour   Intake 2000 ml   Output 1435 ml   Net 565 ml        General appearance: Alert, oriented, no acute distress  HEENT: Normal, Atraumatic, normocephalic, PERRLA   Neck:   no carotid bruit and no JVD  Cardiovascular: Regular rate and rhythm and no murmurs   Lungs:   clear to auscultation bilaterally  Abdomen: abdomen is soft without significant tenderness, masses, organomegaly or guarding  Extremities:   No pedal edema  Dressing Lt knee  Neuro/CNS:   AAOx3, Cranial nerves II to XII WNLs, No motor defeciets, Sensory exam normal    Current Medications:   Current Facility-Administered Medications   Medication Dose Route Frequency    acetaminophen  tab 650 mg  650 mg Oral Q6H PRN    Or    acetaminophen  650 mg supp 650 mg  650 mg Rectal Q6H PRN    aluminum-magnesium hydroxide-simethicone 400-400-40 mg/5 mL susp 30 mL  30 mL Oral Q6H PRN    atorvastatin tab 10 mg  10 mg Oral Nightly    bisacodyl EC tab 5 mg  5 mg Oral Daily PRN    calcium gluconate 2 g in sodium chloride 100 mL IVPB RTU  2 g Intravenous PRN    ceFAZolin 2 g in sterile water PF 20 mL IV injection  2 g IV Push Q8H    cetirizine tab 10 mg  10 mg Oral Daily    insulin aspart (NovoLOG) 100 units/mL inj vial   Subcutaneous QAC & QHS PRN    And    dextrose 50% inj 25 g  25 g IV Push PRN    docusate cap 100 mg  100 mg Oral BID    empagliflozin tab 10 mg  10 mg Oral QAM    hydrALAZINE tab 25 mg  25 mg Oral Q6H PRN    oxyCODONE tab 5 mg  5 mg Oral Q4H PRN    Or HYDROmorphone 1 mg/mL inj 0.5 mg  0.5 mg IV Push Q4H PRN    lactulose 10 g/15 mL soln 20 g  20 g Oral TID PRN    magnesium hydroxide 400 mg/5 mL susp 30 mL  30 mL Oral Daily PRN    magnesium sulfate 2 g in water for injection 50 mL RTU  2 g Intravenous PRN    Or    magnesium sulfate 4 g in water for injection 100 mL RTU  4 g Intravenous PRN    mesalamine (Lialda) DR tab 4.8 g  4.8 g Oral Daily with breakfast    naloxone 0.4 mg/mL inj 0.1 mg  0.1 mg IV Push Q5 min PRN    ondansetron tab 4 mg  4 mg Oral Q8H PRN    Or    ondansetron 4 mg/2 mL inj 4  mg  4 mg Intravenous Q8H PRN    oxyCODONE tab 5 mg  5 mg Oral Q4H PRN    Or    oxyCODONE tab 10 mg  10 mg Oral Q4H PRN    pneumococcal 20-valent vaccine (Prevnar 20) inj 0.5 mL  0.5 mL Intramuscular Once    potassium chloride 10 mEq in water for injection 100 mL IVPB RTU  10 mEq Intravenous PRN    Or    potassium chloride 40 mEq/500 mL IVPB RTU (peripheral) (batched)  40 mEq Intravenous PRN    Or    potassium chloride 30 mEq/500 mL IVPB RTU (peripheral) (batched)  30 mEq Intravenous PRN    Or    potassium chloride 40 mEq/500 mL IVPB RTU (peripheral) (batched)  40 mEq Intravenous PRN    potassium chloride ER tab 20 mEq  20 mEq Oral PRN    Or    potassium chloride ER tab 40 mEq  40 mEq Oral PRN    Or    potassium chloride ER tab 60 mEq  60 mEq Oral PRN    Or    potassium chloride ER tab 80 mEq  80 mEq Oral PRN    rivaroxaban tab 10 mg  10 mg Oral Q24H    senna tab 1 tablet  1 tablet Oral QHS PRN    temazepam cap 7.5 mg  7.5 mg Oral QHS PRN    vancomycin per pharmacy   Does not apply Per Protocol    zinc sulfate heptahydrate cap 50 mg of elemental zinc  50 mg of elemental zinc Oral Daily        (click to expand/collapse)   Labs:   Recent Results (from the past week)   Type and screen (Admission On Day Of Surgery: Nurse Protocol)    Collection Time: 06/19/24  9:00 AM   Result Value Ref Range    Antibody Screen Negative     ABORh A Positive    MRSA Surveillance, Nares Collection Time: 06/19/24  9:00 AM    Specimen: Nares; Respiratory, Upper   Result Value Ref Range    MRSA Surveillance       No Methicillin-resistant Staphylococcus aureus isolated.   Check type (Admission On Day Of Surgery: Nurse Protocol)    Collection Time: 06/19/24  9:04 AM   Result Value Ref Range    Blood Type Confirmed     ABO/Rh Type A Positive    Fungal Culture, Tissue    Collection Time: 06/19/24  1:46 PM    Specimen: Knee, Left; Tissue   Result Value Ref Range    Fungal Culture Negative To Date    Bacterial Culture-Gm Stain, Tissue    Collection Time: 06/19/24  1:46 PM    Specimen: Knee, Left; Tissue   Result Value Ref Range    Bacterial Aerobic Culture Rare Staphylococcus lugdunensis (AA)     Bacterial Aerobic Culture Rare Staphylococcus lugdunensis (AA)     Gram Stain No bacteria seen.        Susceptibility    Staphylococcus lugdunensis - MIC (MCG/ML)*     Penicillin 0.25 Resistant      Clindamycin <=0.12 Susceptible      Oxacillin 2 Susceptible      Vancomycin <=0.5 Susceptible     Staphylococcus lugdunensis - MIC (MCG/ML)*     Penicillin 0.12 Susceptible      Clindamycin <=0.12 Susceptible      Oxacillin 0.5 Susceptible      Vancomycin <=0.5 Susceptible      *  Beta-lactam therapy is preferred for infections due to Staphylococcus lugdunensis that test susceptible to the Beta-lactams.    Oxacillin susceptible Staphylococci are susceptible to all penicillinase-stable penicillins (including nafcillin), cefazolin and cephalexin.     Beta-lactam therapy is preferred for infections due to Staphylococcus lugdunensis that test susceptible to the Beta-lactams.    Oxacillin susceptible Staphylococci are susceptible to all penicillinase-stable penicillins (including nafcillin), cefazolin and cephalexin.   Bacterial Anaerobic Culture, Tissue    Collection Time: 06/19/24  1:46 PM    Specimen: Knee, Left; Tissue   Result Value Ref Range    Anaerobic Culture Negative To Date    Fungal Culture, Tissue    Collection Time: 06/19/24  1:47 PM    Specimen: Knee, Left; Tissue   Result Value Ref Range    Fungal Culture Negative To Date    Bacterial Culture-Gm Stain, Tissue    Collection Time: 06/19/24  1:47 PM    Specimen: Knee, Left; Tissue   Result Value Ref Range    Bacterial Aerobic Culture Rare Staphylococcus lugdunensis (AA)     Bacterial Aerobic Culture Rare Staphylococcus lugdunensis (AA)     Gram Stain No bacteria seen.     Gram Stain Few WBC    Bacterial Anaerobic Culture, Tissue    Collection Time: 06/19/24  1:47 PM    Specimen: Knee, Left; Tissue   Result Value Ref Range    Anaerobic Culture Negative To Date    Fungal Culture, Tissue    Collection Time: 06/19/24  1:47 PM    Specimen: Knee, Left; Tissue   Result Value Ref Range    Fungal Culture Negative To Date    Bacterial Culture-Gm Stain, Tissue    Collection Time: 06/19/24  1:47 PM    Specimen: Knee, Left; Tissue   Result Value Ref Range    Bacterial Aerobic Culture Few Staphylococcus lugdunensis (AA)     Bacterial Aerobic Culture Few Staphylococcus lugdunensis (AA)     Gram Stain No bacteria seen.    Bacterial Anaerobic Culture, Tissue    Collection Time: 06/19/24  1:47 PM    Specimen: Knee, Left; Tissue   Result Value Ref Range    Anaerobic Culture Negative To Date    Fungal Culture, Tissue    Collection Time: 06/19/24  1:47 PM    Specimen: Knee, Left; Tissue   Result Value Ref Range    Fungal Culture Negative To Date    Bacterial Culture-Gm Stain, Tissue    Collection Time: 06/19/24  1:47 PM    Specimen: Knee, Left; Tissue   Result Value Ref Range    Bacterial Aerobic Culture Rare Staphylococcus lugdunensis (AA)     Gram Stain No bacteria seen.    Bacterial Anaerobic Culture, Tissue    Collection Time: 06/19/24  1:47 PM    Specimen: Knee, Left; Tissue   Result Value Ref Range    Anaerobic Culture Negative To Date    POCT glucose    Collection Time: 06/19/24  2:50 PM   Result Value Ref Range    Glucose, Point of Care 165 (H) 65 - 99 mg/dL   POCT glucose Collection Time: 06/19/24  6:33 PM   Result Value Ref Range    Glucose, Point of Care 172 (H) 65 - 99 mg/dL   POCT glucose    Collection Time: 06/19/24  9:11 PM   Result Value Ref Range    Glucose, Point of Care 210 (H) 65 - 99 mg/dL   C-reactive protein  Collection Time: 06/20/24  6:08 AM   Result Value Ref Range    C-Reactive Protein 5.2 (H) <0.3 mg/dL   Sedimentation rate, westergren    Collection Time: 06/20/24  6:08 AM   Result Value Ref Range    Sedimentation rate, Erythrocyte 32 (H) <=12 mm/hr   Albumin    Collection Time: 06/20/24  6:08 AM   Result Value Ref Range    Albumin 2.7 (L) 3.4 - 5.0 g/dL   Basic Metabolic Panel    Collection Time: 06/20/24  6:08 AM   Result Value Ref Range    Sodium 137 136 - 145 mmol/L    Potassium 4.0 3.5 - 5.1 mmol/L    Chloride 107 98 - 107 mmol/L    Total CO2 27 21 - 32 mmol/L    Anion Gap 3 (L) 8 - 19 mmol/L    Glucose 156 (H) 74 - 106 mg/dL    Creatinine 9.39 (L) 0.70 - 1.30 mg/dL    Estimated GFR >10 See GFR Additional Information mL/min/1.52m2    GFR Additional Information      Urea Nitrogen 18 7 - 18 mg/dL    Calcium 9.3 8.5 - 89.8 mg/dL   CBC    Collection Time: 06/20/24  6:08 AM   Result Value Ref Range    White Blood Cell Count 6.73 4.16 - 9.95 x10E3/uL    Red Blood Cell Count 3.35 (L) 4.41 - 5.95 x10E6/uL    Hemoglobin 9.8 (L) 13.5 - 17.1 g/dL    Hematocrit 70.0 (L) 38.5 - 52.0 %    Mean Corpuscular Volume 89.3 79.3 - 98.6 fL    Mean Corpuscular Hemoglobin 29.3 26.4 - 33.4 pg    MCH Concentration 32.8 31.5 - 35.5 g/dL    Red Cell Distribution Width-SD 46.6 36.9 - 48.3 fL    Red Cell Distribution Width-CV 14.3 11.1 - 15.5 %    Platelet Count, Auto 160 143 - 398 x10E3/uL    Mean Platelet Volume 8.8 (L) 9.3 - 13.0 fL    Nucleated RBC%, automated 0.0 No Ref. Range %    Absolute Nucleated RBC Count 0.00 0.00 - 0.00 x10E3/uL   Differential, Automated    Collection Time: 06/20/24  6:08 AM   Result Value Ref Range    Neutrophil Percent, Auto 71.4 No Ref. Range % Lymphocyte Percent, Auto 16.8 No Ref. Range %    Monocyte Percent, Auto 11.0 No Ref. Range %    Eosinophil Percent, Auto 0.3 No Ref. Range %    Basophil Percent, Auto 0.1 No Ref. Range %    Immature Granulocytes% 0.4 No Reference Range %    Absolute Neut Count 4.80 1.80 - 6.90 x10E3/uL    Absolute Lymphocyte Count 1.13 (L) 1.30 - 3.40 x10E3/uL    Absolute Mono Count 0.74 0.20 - 0.80 x10E3/uL    Absolute Eos Count <0.03 0.00 - 0.50 x10E3/uL    Absolute Baso Count <0.03 0.00 - 0.10 x10E3/uL    Absolute Immature Gran Count 0.03 0.00 - 0.04 x10E3/uL   POCT glucose    Collection Time: 06/20/24  7:29 AM   Result Value Ref Range    Glucose, Point of Care 141 (H) 65 - 99 mg/dL   POCT glucose    Collection Time: 06/20/24  5:30 PM   Result Value Ref Range    Glucose, Point of Care 236 (H) 65 - 99 mg/dL   POCT glucose    Collection Time: 06/20/24  9:27 PM   Result Value  Ref Range    Glucose, Point of Care 151 (H) 65 - 99 mg/dL   Basic Metabolic Panel    Collection Time: 06/21/24  5:39 AM   Result Value Ref Range    Sodium 137 136 - 145 mmol/L    Potassium 3.6 3.5 - 5.1 mmol/L    Chloride 106 98 - 107 mmol/L    Total CO2 30 21 - 32 mmol/L    Anion Gap 1 (L) 8 - 19 mmol/L    Glucose 151 (H) 74 - 106 mg/dL    Creatinine 9.59 (L) 0.70 - 1.30 mg/dL    Estimated GFR >10 See GFR Additional Information mL/min/1.35m2    GFR Additional Information      Urea Nitrogen 11 7 - 18 mg/dL    Calcium 8.9 8.5 - 89.8 mg/dL   Magnesium    Collection Time: 06/21/24  5:39 AM   Result Value Ref Range    Magnesium 1.3 (L) 1.5 - 2.0 mEq/L   Phosphorus    Collection Time: 06/21/24  5:39 AM   Result Value Ref Range    Phosphorus 2.7 2.5 - 4.9 mg/dL   CBC    Collection Time: 06/21/24  5:39 AM   Result Value Ref Range    White Blood Cell Count 5.08 4.16 - 9.95 x10E3/uL    Red Blood Cell Count 3.17 (L) 4.41 - 5.95 x10E6/uL    Hemoglobin 9.3 (L) 13.5 - 17.1 g/dL    Hematocrit 71.7 (L) 38.5 - 52.0 %    Mean Corpuscular Volume 89.0 79.3 - 98.6 fL    Mean Corpuscular Hemoglobin 29.3 26.4 - 33.4 pg    MCH Concentration 33.0 31.5 - 35.5 g/dL    Red Cell Distribution Width-SD 47.1 36.9 - 48.3 fL    Red Cell Distribution Width-CV 14.5 11.1 - 15.5 %    Platelet Count, Auto 156 143 - 398 x10E3/uL    Mean Platelet Volume 8.8 (L) 9.3 - 13.0 fL    Nucleated RBC%, automated 0.0 No Ref. Range %    Absolute Nucleated RBC Count 0.00 0.00 - 0.00 x10E3/uL   Differential, Automated    Collection Time: 06/21/24  5:39 AM   Result Value Ref Range    Neutrophil Percent, Auto 63.0 No Ref. Range %    Lymphocyte Percent, Auto 23.8 No Ref. Range %    Monocyte Percent, Auto 11.2 No Ref. Range %    Eosinophil Percent, Auto 1.0 No Ref. Range %    Basophil Percent, Auto 0.6 No Ref. Range %    Immature Granulocytes% 0.4 No Reference Range %    Absolute Neut Count 3.20 1.80 - 6.90 x10E3/uL    Absolute Lymphocyte Count 1.21 (L) 1.30 - 3.40 x10E3/uL    Absolute Mono Count 0.57 0.20 - 0.80 x10E3/uL    Absolute Eos Count 0.05 0.00 - 0.50 x10E3/uL    Absolute Baso Count 0.03 0.00 - 0.10 x10E3/uL    Absolute Immature Gran Count <0.03 0.00 - 0.04 x10E3/uL   Wheeled Walker    Collection Time: 06/21/24  7:59 AM   Result Value Ref Range    Supplier Name [employer] [phone]     Delivery Status Delivery Successful     Delivery Note      Delivery Date Requested 06/21/2024     Actual Delivery Date 06/22/2024     Expected Delivery Date 06/21/2024     Item Description Glenys Finder, Adult    POCT glucose  Collection Time: 06/21/24 12:49 PM   Result Value Ref Range    Glucose, Point of Care 153 (H) 65 - 99 mg/dL   Vancomycin,trough    Collection Time: 06/21/24  5:16 PM   Result Value Ref Range    Vancomycin,trough 8.2 (L) 10.0 - 20.0 mcg/mL   POCT glucose    Collection Time: 06/21/24  5:26 PM   Result Value Ref Range    Glucose, Point of Care 193 (H) 65 - 99 mg/dL   POCT glucose    Collection Time: 06/21/24  9:00 PM   Result Value Ref Range    Glucose, Point of Care 177 (H) 65 - 99 mg/dL   Basic Metabolic Panel    Collection Time: 06/22/24  4:16 AM   Result Value Ref Range    Sodium 140 136 - 145 mmol/L    Potassium 3.8 3.5 - 5.1 mmol/L    Chloride 105 96 - 106 mmol/L    Total CO2 31 21 - 32 mmol/L    Anion Gap 4 (L) 8 - 19 mmol/L    Glucose 159 (H) 74 - 106 mg/dL    Creatinine 9.49 (L) 0.70 - 1.30 mg/dL    Estimated GFR >10 See GFR Additional Information mL/min/1.6m2    GFR Additional Information      Urea Nitrogen 11 7 - 18 mg/dL    Calcium 8.8 8.5 - 89.8 mg/dL   POCT glucose    Collection Time: 06/22/24  7:19 AM   Result Value Ref Range    Glucose, Point of Care 157 (H) 65 - 99 mg/dL   POCT glucose    Collection Time: 06/22/24 11:54 AM   Result Value Ref Range    Glucose, Point of Care 124 (H) 65 - 99 mg/dL   POCT glucose    Collection Time: 06/22/24  5:01 PM   Result Value Ref Range    Glucose, Point of Care 157 (H) 65 - 99 mg/dL   POCT glucose    Collection Time: 06/22/24  9:04 PM   Result Value Ref Range    Glucose, Point of Care 151 (H) 65 - 99 mg/dL   Basic Metabolic Panel    Collection Time: 06/23/24  5:01 AM   Result Value Ref Range    Sodium 138 136 - 145 mmol/L    Potassium 3.7 3.5 - 5.1 mmol/L    Chloride 106 98 - 107 mmol/L    Total CO2 30 21 - 32 mmol/L    Anion Gap 2 (L) 8 - 19 mmol/L    Glucose 137 (H) 74 - 106 mg/dL    Creatinine 9.49 (L) 0.70 - 1.30 mg/dL    Estimated GFR >10 See GFR Additional Information mL/min/1.56m2    GFR Additional Information      Urea Nitrogen 11 7 - 18 mg/dL    Calcium 9.1 8.5 - 89.8 mg/dL   CBC    Collection Time: 06/23/24  5:01 AM   Result Value Ref Range    White Blood Cell Count 4.07 (L) 4.16 - 9.95 x10E3/uL    Red Blood Cell Count 3.32 (L) 4.41 - 5.95 x10E6/uL    Hemoglobin 9.8 (L) 13.5 - 17.1 g/dL    Hematocrit 70.0 (L) 38.5 - 52.0 %    Mean Corpuscular Volume 90.1 79.3 - 98.6 fL    Mean Corpuscular Hemoglobin 29.5 26.4 - 33.4 pg    MCH Concentration 32.8 31.5 - 35.5 g/dL    Red Cell Distribution Width-SD 48.1 36.9 -  48.3 fL    Red Cell Distribution Width-CV 14.6 11.1 - 15.5 %    Platelet Count, Auto 197 143 - 398 x10E3/uL    Mean Platelet Volume 9.0 (L) 9.3 - 13.0 fL    Nucleated RBC%, automated 0.0 No Ref. Range %    Absolute Nucleated RBC Count 0.00 0.00 - 0.00 x10E3/uL   Differential, Automated    Collection Time: 06/23/24  5:01 AM   Result Value Ref Range    Neutrophil Percent, Auto 56.5 No Ref. Range %    Lymphocyte Percent, Auto 30.5 No Ref. Range %    Monocyte Percent, Auto 9.8 No Ref. Range %    Eosinophil Percent, Auto 1.7 No Ref. Range %    Basophil Percent, Auto 1.0 No Ref. Range %    Immature Granulocytes% 0.5 No Reference Range %    Absolute Neut Count 2.30 1.80 - 6.90 x10E3/uL    Absolute Lymphocyte Count 1.24 (L) 1.30 - 3.40 x10E3/uL    Absolute Mono Count 0.40 0.20 - 0.80 x10E3/uL    Absolute Eos Count 0.07 0.00 - 0.50 x10E3/uL    Absolute Baso Count 0.04 0.00 - 0.10 x10E3/uL    Absolute Immature Gran Count <0.03 0.00 - 0.04 x10E3/uL   POCT glucose    Collection Time: 06/23/24  6:28 AM   Result Value Ref Range    Glucose, Point of Care 136 (H) 65 - 99 mg/dL   POCT glucose    Collection Time: 06/23/24 11:26 AM   Result Value Ref Range    Glucose, Point of Care 154 (H) 65 - 99 mg/dL        89/7/74  Assessment:  #Prosthetic joint infection Lt knee  #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  #DM2  #Hyperlipidemia  #BPH  #Ulcerative colitis      Plan:  POD1  Postop care per ortho: pain control, PT/OT, DVT prophylaxis  Empiric antibiotics in the form of IV vancomycin and cefepime pending intra-op cultures  ID consult pending  Cont to hold jardiance; cover with SSI  Cont atorvastatin  Hold azathioprine; cont mesalamine  PRN hydralazine for adequate BP control  Medically stable; will cont to follow      06/21/24  Preliminary intra-op cultures growing Staphlococcus lugdunensis  On IV vanco & ceftriaxone per ID  Cont to hold azathioprine; cont mesalamine  SSI  Cont atorvastatin  Medically stable with stable labs; will cont to follow      06/22/24  Preliminary intra-op cultures growing Staphlococcus lugdunensis sensitive to oxacillin  On IV vanco & ceftriaxone; taper antibiotics per ID  Cont to hold azathioprine; cont mesalamine  SSI  Cont atorvastatin  Medically stable with stable labs; will cont to follow  DC planning: home with home IV abxs    06/23/24  Will de-escalate abxs from vanco/ceftriaxone to IV cefazolin per intra-op cultures(Staphlococcus lugdunensis sensitive to oxacillin)  Pain control, PT/OT, DVT prophylaxis  Cont to hold azathioprine; cont mesalamine  SSI  Cont atorvastatin  Medically stable with stable labs; will cont to follow  DC planning: home with home IV abxs             Aalijah Mims R. Gwyn Hieronymus, MD

## 2024-06-23 NOTE — Other
 Patient's Clinical Goal:   Clinical Goal(s) for the Shift: VSS, Pain  Identify possible barriers to advancing the care plan:   Stability of the patient: Moderately Stable - low risk of patient condition declining or worsening   Progression of Patient's Clinical Goal:   Neuro: Within Defined Limits   BMAT: Level 3 - Yellow  Assistive Device: Walker    Vitals:    Temp:  [36.6 ?C (97.9 ?F)-37.1 ?C (98.8 ?F)] 36.7 ?C (98.1 ?F)  Heart Rate:  [65-72] 66  Resp:  [17-18] 18  BP: (124-138)/(61-72) 134/63  NBP Mean:  [82-91] 87  SpO2:  [95 %-97 %] 96 %  Cardiac:     Ectopy:   Respiratory:  None (Room air)        Pain:    Last BM: 06/20/24  GU: Voiding  Skin: Intact except incision  Lines/Tubes/Drains:   Peripheral IV 22 G Right Antecubital (2)  Negative Pressure Wound Therapy Thigh  Surgical Drain Anterior;Left Knee Bulb (4)       Shift Events/Acute Changes:   Continue on vancomycin  Denies pain  JP drained a total of 60 ml of serousanguinous fluid.  VSS, afebrile.     To Do/Pending:       Nursing Considerations for Discharge:       Expected Discharge Disposition: Home w/Home Health     Expected Discharge Date: 06/21/2024

## 2024-06-24 ENCOUNTER — Ambulatory Visit: Payer: MEDICARE

## 2024-06-24 ENCOUNTER — Ambulatory Visit: Payer: PRIVATE HEALTH INSURANCE

## 2024-06-24 LAB — Tissue Exam

## 2024-06-24 LAB — Anaerobic Culture
ANAEROBIC CULT-GM ST: NEGATIVE
ANAEROBIC CULT-GM ST: NEGATIVE
ANAEROBIC CULT-GM ST: NEGATIVE
ANAEROBIC CULT-GM ST: NEGATIVE

## 2024-06-24 LAB — Glucose,POC
GLUCOSE,POC: 170 mg/dL — ABNORMAL HIGH (ref 65–99)
GLUCOSE,POC: 136 mg/dL — ABNORMAL HIGH (ref 65–99)
GLUCOSE,POC: 125 mg/dL — ABNORMAL HIGH (ref 65–99)

## 2024-06-24 LAB — Basic Metabolic Panel: CHLORIDE: 106 mmol/L (ref 98–107)

## 2024-06-24 LAB — Hepatic Funct Panel: BILIRUBIN,CONJUGATED: <0.1 mg/dL (ref 0.2–<=0.2)

## 2024-06-24 MED ORDER — SILVER SULFADIAZINE 1 % EX CREA
1 | Freq: Two times a day (BID) | TOPICAL | 0 refills | 30.00000 days | Status: AC
Start: 2024-06-24 — End: ?

## 2024-06-24 MED ORDER — CEFAZOLIN 2 G IN SWFI IVP (1G X2 VIALS)
2 g | Freq: Three times a day (TID) | INTRAVENOUS
Start: 2024-06-24 — End: ?

## 2024-06-24 MED ORDER — RIVAROXABAN 10 MG PO TABS
10 mg | ORAL_TABLET | Freq: Every day | ORAL | 0 refills | 72.00000 days | Status: AC
Start: 2024-06-24 — End: ?

## 2024-06-24 MED ORDER — OXYCODONE-ACETAMINOPHEN 10-325 MG PO TABS
1 | ORAL_TABLET | Freq: Four times a day (QID) | ORAL | 0 refills | 21.00000 days | Status: AC | PRN
Start: 2024-06-24 — End: ?

## 2024-06-24 MED ORDER — NALOXONE HCL 4 MG/0.1ML NA LIQD
NASAL | 1 refills | 1.00000 days | Status: AC
Start: 2024-06-24 — End: ?

## 2024-06-24 MED ADMIN — CETIRIZINE HCL 10 MG PO TABS: 10 mg | ORAL | @ 15:00:00 | Stop: 2024-06-25 | NDC 51079059701

## 2024-06-24 MED ADMIN — DOCUSATE SODIUM 100 MG PO CAPS: 100 mg | ORAL | @ 04:00:00 | Stop: 2024-06-25 | NDC 00904728080

## 2024-06-24 MED ADMIN — ZINC SULFATE 220 (50 ZN) MG PO CAPS: 50 mg | ORAL | @ 15:00:00 | Stop: 2024-06-25

## 2024-06-24 MED ADMIN — DOCUSATE SODIUM 100 MG PO CAPS: 100 mg | ORAL | @ 15:00:00 | Stop: 2024-06-25 | NDC 00904728080

## 2024-06-24 MED ADMIN — CEFAZOLIN 2 G IN SWFI IVP (1G X2 VIALS): 2 g | INTRAVENOUS | @ 15:00:00 | Stop: 2024-06-25 | NDC 00409488717

## 2024-06-24 MED ADMIN — SENNOSIDES 8.6 MG PO TABS: 1 | ORAL | @ 04:00:00 | Stop: 2024-06-25 | NDC 00904725261

## 2024-06-24 MED ADMIN — EMPAGLIFLOZIN 10 MG PO TABS: 10 mg | ORAL | @ 15:00:00 | Stop: 2024-06-25 | NDC 00597015237

## 2024-06-24 MED ADMIN — MESALAMINE 1.2 G PO TBEC: 4.8 g | ORAL | @ 18:00:00 | Stop: 2024-06-25

## 2024-06-24 MED ADMIN — CEFAZOLIN 2 G IN SWFI IVP (1G X2 VIALS): 2 g | INTRAVENOUS | @ 23:00:00 | Stop: 2024-06-25 | NDC 60505614200

## 2024-06-24 MED ADMIN — CEFAZOLIN 2 G IN SWFI IVP (1G X2 VIALS): 2 g | INTRAVENOUS | @ 07:00:00 | Stop: 2024-06-25 | NDC 00409488717

## 2024-06-24 MED ADMIN — MESALAMINE 1.2 G PO TBEC: 4.8 g | ORAL | @ 20:00:00 | Stop: 2024-06-25 | NDC 60687039795

## 2024-06-24 MED ADMIN — RIVAROXABAN 20 MG PO TABS: 10 mg | ORAL | @ 23:00:00 | Stop: 2024-06-25 | NDC 50458057910

## 2024-06-24 MED ADMIN — ATORVASTATIN CALCIUM 10 MG PO TABS: 10 mg | ORAL | @ 04:00:00 | Stop: 2024-06-25 | NDC 68084009711

## 2024-06-24 NOTE — Progress Notes
 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, procedure,,   I and D of left knee, extraction of infection prosthesis, and abx cement placement,   Events/Complaints:  doing okay    OBJECTIVE:  Vitals signs:   Temp:  [36.5 ?C (97.7 ?F)-37 ?C (98.6 ?F)] 36.5 ?C (97.7 ?F)  Heart Rate:  [25-70] 68  Resp:  [17-18] 18  BP: (122-139)/(62-78) 139/65  NBP Mean:  [85-93] 90  SpO2:  [96 %-98 %] 97 %      PHYSICAL EXAM:    NAD, alert and oriented times three  Pink conjunctiva, anicteric, NO CLAD  RRR, no murmurs  CTAB, no crackles or wheezes  Soft, non-tender abdomen  Left knee bandaged and left leg swollen  Neuro: non-focal    LABORATORY DATA (REVIEWED):     Recent Labs     06/23/24  0501   WBC 4.07*   HGB 9.8*   HCT 29.9*   MCV 90.1   PLT 197     Recent Labs     06/24/24  0452 06/23/24  0501 06/22/24  0416   NA 137 138 140   K 4.0 3.7 3.8   CL 106 106 105   CO2 30 30 31    BUN 12 11 11    CREAT 0.60* 0.50* 0.50*   CALCIUM 9.3 9.1 8.8     estimated creatinine clearance is 122.4 mL/min (A) (by C-G formula based on SCr of 0.6 mg/dL (L)).    Recent Labs     06/24/24  0452   TOTPRO 6.4   ALBUMIN 2.8*   BILITOT 0.3   BILICON <0.1   ALT 17   AST 17   ALKPHOS 66         MICROBIOLOGY:  Recent Results (from the past 6 weeks)   MRSA Surveillance, Nares    Collection Time: 06/19/24  9:00 AM    Specimen: Nares; Respiratory, Upper   Result Value Ref Range    MRSA Surveillance       No Methicillin-resistant Staphylococcus aureus isolated.   Fungal Culture, Tissue    Collection Time: 06/19/24  1:46 PM    Specimen: Knee, Left; Tissue   Result Value Ref Range    Fungal Culture Negative To Date    Bacterial Culture-Gm Stain, Tissue    Collection Time: 06/19/24  1:46 PM    Specimen: Knee, Left; Tissue   Result Value Ref Range    Bacterial Aerobic Culture Rare Staphylococcus lugdunensis (AA)     Bacterial Aerobic Culture Rare Staphylococcus lugdunensis (AA)     Gram Stain No bacteria seen.        Susceptibility    Staphylococcus lugdunensis - MIC (MCG/ML)*     Penicillin 0.25 Resistant      Clindamycin <=0.12 Susceptible      Oxacillin 2 Susceptible      Vancomycin <=0.5 Susceptible     Staphylococcus lugdunensis - MIC (MCG/ML)*     Penicillin 0.12 Susceptible      Clindamycin <=0.12 Susceptible      Oxacillin 0.5 Susceptible      Vancomycin <=0.5 Susceptible      * Beta-lactam therapy is  preferred for infections due to Staphylococcus lugdunensis that test susceptible to the Beta-lactams.    Oxacillin susceptible Staphylococci are susceptible to all penicillinase-stable penicillins (including nafcillin), cefazolin and cephalexin.     Beta-lactam therapy is preferred for infections due to Staphylococcus lugdunensis that test susceptible to the Beta-lactams.    Oxacillin susceptible Staphylococci are susceptible to all penicillinase-stable penicillins (including nafcillin), cefazolin and cephalexin.   Bacterial Anaerobic Culture, Tissue    Collection Time: 06/19/24  1:46 PM    Specimen: Knee, Left; Tissue   Result Value Ref Range    Anaerobic Culture Negative    Fungal Culture, Tissue    Collection Time: 06/19/24  1:47 PM    Specimen: Knee, Left; Tissue   Result Value Ref Range    Fungal Culture Negative To Date    Bacterial Culture-Gm Stain, Tissue    Collection Time: 06/19/24  1:47 PM    Specimen: Knee, Left; Tissue   Result Value Ref Range    Bacterial Aerobic Culture Rare Staphylococcus lugdunensis (AA)     Bacterial Aerobic Culture Rare Staphylococcus lugdunensis (AA)     Gram Stain No bacteria seen.     Gram Stain Few WBC    Bacterial Anaerobic Culture, Tissue    Collection Time: 06/19/24  1:47 PM    Specimen: Knee, Left; Tissue   Result Value Ref Range    Anaerobic Culture Negative Fungal Culture, Tissue    Collection Time: 06/19/24  1:47 PM    Specimen: Knee, Left; Tissue   Result Value Ref Range    Fungal Culture Negative To Date    Bacterial Culture-Gm Stain, Tissue    Collection Time: 06/19/24  1:47 PM    Specimen: Knee, Left; Tissue   Result Value Ref Range    Bacterial Aerobic Culture Few Staphylococcus lugdunensis (AA)     Bacterial Aerobic Culture Few Staphylococcus lugdunensis (AA)     Gram Stain No bacteria seen.    Bacterial Anaerobic Culture, Tissue    Collection Time: 06/19/24  1:47 PM    Specimen: Knee, Left; Tissue   Result Value Ref Range    Anaerobic Culture Negative    Fungal Culture, Tissue    Collection Time: 06/19/24  1:47 PM    Specimen: Knee, Left; Tissue   Result Value Ref Range    Fungal Culture Negative To Date    Bacterial Culture-Gm Stain, Tissue    Collection Time: 06/19/24  1:47 PM    Specimen: Knee, Left; Tissue   Result Value Ref Range    Bacterial Aerobic Culture Rare Staphylococcus lugdunensis (AA)     Gram Stain No bacteria seen.    Bacterial Anaerobic Culture, Tissue    Collection Time: 06/19/24  1:47 PM    Specimen: Knee, Left; Tissue   Result Value Ref Range    Anaerobic Culture Negative        MEDICATIONS:  ceFAZolin in SWFI 100 mg/mL IV inj (from 1 g vial)       ASSESSMENT Daryl Graves:  06/21/24  Stable, afebrile, on room air,   Post op s/p extraction of infected left knee hardware, and cement placement,   Surgical cx staph lugdunensis,, sensivity pending,   If oxacillin sensitive, --- pt can be transitioned to IV cefazoline 2g IV q8 for 6 weeks, discussed with Daryl Graves microbiology, cx will finalize on 06/22/24  Hydration, supportive tx,   ID consult will fu,     06/23/2023  Monitor the labs and the temperature.  Continue supportive care.  Taper antibiotics  to ancef.  S lugdunensis was oxacilllin sensitive.  Long discussion with the patient.      06/23/2024  Continue ancef.  Discussed with Dr Daryl Graves. Monitor the labs. Monitor for fever.       06/24/24  Stable, afebrile, on room air,   Post op s/p extraction of infected left knee hardware, and cement placement,   Surgical cx staph lugdunensis on IV cefazoline 2g IV q8 for 6 weeks,   Cmp and esr once a week,   Hydration, supportive tx,   ID consult will fu,   Cletis Smolder, MD

## 2024-06-24 NOTE — Other
 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   Respiratory:  None (Room air)    Pain:  None  Last BM: 06/24/24  GU: Voiding  Skin: Intact except incision  Lines/Tubes/Drains:   Negative Pressure Wound Therapy Thigh  Surgical Drain Anterior;Left Knee Bulb (5)     Shift Events/Acute Changes:   Continue carb controlled diet and blood sugar checks every before meals. Patient declines Insulin coverage.  Denies pain or need for pain medication.  IV Cefazolin every 8hr  Single lumen PICC line placed today.  @17 :42, Discharged via wheelchair accompanied by CCP Richardson. PIV removed, AVS given, all belongings sent. Wife with patient and is the one driving him home.     To Do/Pending:       Nursing Considerations for Discharge:       Expected Discharge Disposition: Home w/Home Health for IV antibiotics     Expected Discharge Date: 06/24/2024

## 2024-06-24 NOTE — Unmapped
 [id number]   Peripherally Inserted Central Catheter (PICC)   You need a peripherally inserted central catheter (PICC) for your treatment. A catheter is a small, soft tube. It is inserted into a vein in your arm. It is then moved through your vein until the tip sits in the large vein (vena cava) near your heart. A PICC is often used when treatment requires you to have medicine or nutrition for weeks or months.   When you no longer need the PICC, your health care provider will remove it by gently pulling out the catheter, holding pressure at the site, and then covering the site with a small bandage. Your skin will then heal.   This article tells you more about a PICC and how a provider places it in your body.       A PICC may have more than one channel. This means that different fluids or medicines can be given at once. Keep in mind that your PICC should be accessed only by trained medical professionals.    Why do I need a PICC?   A PICC takes the place of a standard IV (intravenous) line. A standard IV needs to be changed every few days. Since the PICC can stay in place longer, you may have fewer needle sticks during your treatment. There is less damage to the small veins where an IV would normally be inserted. Your health care provider can give you more details about why you need the PICC.       Getting a PICC   A short procedure is done to place the PICC in your body. This will be done in your hospital room, the radiology department, or somewhere else in the hospital. The procedure may vary slightly from the steps described here. Your care team can tell you what to expect. In general, during PICC placement:   ?  You?re fully covered with a large sterile sheet (drape). This lowers the risk for infection. Only the area where the PICC will be placed is exposed. The skin is first cleaned with an antiseptic solution.   ?  Ultrasound images may be viewed on a video screen. These are used to help find the best vein to use.   ?  The area where the PICC will be inserted is numbed with an injection of local anesthetic medicine. This decreases discomfort during the PICC placement.   ?  After the local anesthetic takes effect, the catheter is gently passed into the vein. It?s moved along until the tip is in the vena cava, close to the heart.   ?  The other end of the catheter sticks out a few inches from your skin. It may be loosely attached to the skin with stitches (sutures) or a securement device. A dressing is placed over the area where the catheter enters your skin.   ?  The health care provider will flush the catheter with saline solution to clear it. The solution may include heparin, which prevents blood clots.   ?  An X-ray or other imaging test is done. This confirms the catheter?s position and checks for problems.       Risks and complications   As with any procedure, getting a PICC has certain risks. These include:   ?  Infection.   ?  Bleeding problems.   ?  An irregular heartbeat.   ?  Injury to the vein or to lymph ducts near the vein.   ?  Inflammation of the vein (  phlebitis).   ?  Clots or air bubbles in the bloodstream.   ?  Blockage of the blood vessel where the catheter is inserted.   ?  Clots in the vein that may travel to the lung (pulmonary embolism).   ?  Nerve injury.   ?  Accidental insertion into an artery instead of a vein.   ?  Catheter not positioned correctly, or the catheter may move or migrate from its correct position.     Last Reviewed Date: 10/21/2023 00:00:00   ? 2000-2025 [employer], [place]. All rights reserved. This information is not intended as a substitute for professional medical care. Always follow your healthcare professional's instructions.

## 2024-06-24 NOTE — Unmapped
 [id number]   Discharge Instructions: Changing the Dressing on Your Peripherally Inserted Central Catheter (PICC)   You are going home with a peripherally inserted central catheter (PICC). This small, soft tube has been placed in a vein in your arm. Where the catheter enters your body, it?s covered with a bandage (dressing). The bandage is often made of clear (transparent) plastic. This helps keep the area free of germs. To prevent infection, you need to keep the dressing clean and dry. Only change the dressing if you or a caregiver have been told to do so. This sheet explains the process. Also follow any specific directions you get from your health care provider.   Prevent infection with good hand hygiene   A PICC can let germs into your body. This can lead to serious and sometimes deadly infections. To prevent infection, it?s very important that you, your caregivers, and others around you use good hand hygiene. This means washing your hands well with soap and clean, running water, and using an alcohol-based hand gel as directed. Never touch the PICC or dressing without first using one of these methods.   To wash your hands with soap and water:   ?  Wet your hands with clean, running water. (Don't use hot water. It can cause skin irritation when you wash your hands often.)   ?  Apply enough soap to cover the whole surface of your hands, including your fingers.   ?  Rub your hands together vigorously for at least 20 seconds. Make sure to rub the front and back of each hand up to the wrist, your fingers and fingernails, between the fingers, and each thumb.   ?  Rinse your hands with clean, running water.   ?  Dry your hands completely with a new, unused paper towel. Don?t use a cloth towel or other reusable towel. These can harbor germs.   ?  Use the paper towel to turn off the faucet, then throw it away. If you?re in a bathroom, also use a paper towel to open the door instead of touching the handle.   When you don?t have access to soap and water: Use an alcohol-based hand gel to clean your hands. The gel should have at least 60% alcohol. Follow the directions on the package. Your care team can answer any questions you have about when to use hand gel, or when it?s better to wash with soap and water.       When to change the dressing   ?  If you have a clear dressing, change it every 7 days (or more often if directed).   ?  If you have a gauze dressing, change it every 2 days. This includes gauze under a clear (transparent) dressing.   ?  If the dressing becomes loose, dirty, or wet, change it right away. Report this to your health care provider as soon as possible. Dressings will need to be changed more often if you have been sweating, as with a fever.       Preventing infection while changing the dressing   The PICC provides a direct path into your bloodstream. The chance of infection is high as you change the dressing. Don?t touch the catheter where it enters the skin. And be very careful to keep your work area and supplies clean. Following the steps on this sheet will help. Keep in mind that some supplies come in germ-free (sterile) packaging. Make sure to keep these sterile during the dressing change.  Supplies for changing the dressing   A general list of supplies is below. Your care team will provide you with a list of specific items and brands to use. Or you may get a kit that has everything you need. Your supplies may include:   ?  Gauze dressing.   ?  Transparent dressing.   ?  Antimicrobial sponge disk.   ?  Antiseptic supplies to clean the skin and around the catheter exit site (such as chlorhexidine plus alcohol).   ?  Sterile gloves.   ?  Nonsterile gloves and a mask.   ?  Medical tape, adhesive strips, or a securement device.   Before you start, review the steps below and make sure you understand them. If you?re not sure what to do or how to use your supplies, ask a member of your care team before you try to change the dressing.   Note: Since the PICC is in your arm, you?ll only have one free arm during the dressing change. This makes it very hard to change the dressing yourself. Have someone available to help you.   Step 1. Wash your [employer] your hands well with soap and water. Use the method described above.   Step 2. Prepare your work area   ?  Choose an area with a hard, flat surface where you can easily spread out the supplies, such as a desk or table. Don?t use the bathroom; there are too many germs.   ?  Put pets and children out of the work area. Keep them out until the dressing change is done.   ?  Clean washable surfaces with soap and water. Dry with a clean, unused paper towel. Then throw the paper towel away.   ?  Spread clean, unused paper towels over your work surface. Use as many as you need to cover it.   ?  If you need to cough or sneeze, move away from your work surface first.    Step 3. Lay out your supplies   ?  Clean your hands with soap and water or hand gel. Do this before you lay out your supplies on the work surface.   ?  After cleaning your hands, only touch your supplies. If you do touch anything else, such as furniture or your clothes, wash your hands again. This is very important for preventing infection.   ?  Place your supplies on the cleaned and dried work surface. Lay them out in the order you will be using them.   ?  If you?re using a kit with a sterile tray, take off the plastic covering. Remove the covering only. Don?t open the tray. Keep the plastic covering. You can use it to dispose of the old dressing.   Step 4. Remove the old dressing   ?  Put on clean gloves and a mask. Anyone who is helping you change the dressing should do this, too. (Don?t use sterile gloves for this step. Sterile gloves are used in Step 5.)   ?  Take off the old dressing by gently pulling the edges. Carefully peel off the dressing in the direction of the catheter site. While doing this, hold the end of the catheter (lumen) so it doesn?t pull out of your body.   ?  If a securement device is in place, carefully remove it using the method your nurse showed you.   ?  Check the catheter site for signs of infection, such as  redness, swelling, drainage, or a bad odor. If you notice any, contact your care team when you?re finished changing the dressing.   ?  Wrap the old dressing in plastic (if available) and put it aside.   Step 5. Prepare sterile supplies   ?  Remove your gloves and clean your hands with hand gel. You should still be wearing the mask.    ?  Open any sterile supplies, such as the transparent dressing and sterile gloves. Follow the directions on the package or your care team?s directions.   ?  Put on sterile gloves. Follow the directions on the package or as you were shown in the hospital.   Step 6. Clean the catheter site   ?  If the skin under the dressing has dried blood or a lot of drainage, clean it as directed by your care team.   ?  Clean the catheter exit site using the antiseptic supplies your care team recommends. It?s very important to follow the package directions and your provider?s directions exactly.   Step 7. Apply the new dressing   ?  If you?re using a securement device, apply it as you were shown. This may be skipped if you have stitches holding the PICC in place. (Be sure to report any loose stitches to your provider.)   ?  If you?re using a gauze dressing, apply it over the catheter exit site the way your nurse showed you.   ?  If you?re using an antimicrobial sponge disk, place it around the catheter with the correct side touching your skin. Line up the slit on the sponge disk with the catheter.   ?  Apply the transparent dressing over the catheter exit site and over the gauze or sponge disk (if used). Put the top end down first. Then smooth out the rest across the area where the catheter exits your skin. Make sure the dressing covers the entire catheter.   ?  Take off and discard the sterile gloves and mask.   ?  Tape the end of the catheter (lumen) to your skin, as directed by your provider.   ?  Throw away the old dressing and used supplies.   ?  Wash your hands.       Changing the injection caps   The catheter?s injection caps need to be changed every 3 to 7 days. This is often done at the same time as the dressing change. Your health care provider will give you directions.       Risk of blood clot   If a blood clot forms, it can block blood flow through the vein where the catheter was placed. Signs of a blood clot include pain or swelling in your neck, face, chest, or arm. If you have any of these symptoms, contact your health care provider right away. You may need an ultrasound exam to locate the blood clot and be treated with a blood thinner.       When to get medical care   Contact your health care provider right away if any of the following occur:   ?  Pain or swelling in your shoulder, chest, back, arm, or leg   ?  Fever of 100.4? F ( 38.0?C ) or higher   ?  Chills   ?  Signs of infection at the catheter site (pain, redness, drainage, burning, or stinging)   ?  Coughing, wheezing, or shortness of breath   ?  A racing or  irregular heartbeat   ?  Muscle stiffness or trouble moving   ?  Gurgling noises coming from the catheter   ?  The catheter falls out, breaks, cracks, leaks, or has other damage     Last Reviewed Date: 11/18/2023 00:00:00   ? 2000-2025 [employer], [place]. All rights reserved. This information is not intended as a substitute for professional medical care. Always follow your healthcare professional's instructions.

## 2024-06-24 NOTE — Unmapped
 Removing Your Peripherally Inserted Central Catheter - Video   See how your healthcare provider will quickly and easily remove your PICC line when it is no longer needed.   To view the video go to this web address:   https://bit.ly/447EzF9   Or, scan this QR code with your smart phone      ? [employer]

## 2024-06-24 NOTE — Consults
 FINAL DISCHARGE MULTIDISCIPLINARY NOTE  Department of Care Coordination      Admit Ijuz:[id number]  Anticipated Date of Discharge: 06/24/2024    Following FI:Rbmjw, Gwenn SQUIBB., MD    Home Address:[address]  [place] [address]    DISCHARGE INFORMATION   Discharge Address: [phone] [address] [place] [address]. Discharging home.    Individual(s) notified of discharge plan:  Contact Name: Demarko  Relationship: Self  Contact Number(s): [phone]    Is patient/family informed of discharge?: Yes  Is patient/family agreeable of discharge destination?: Yes  Support Systems: Spouse  Medicare Important Message Provided: Yes  Final Discharge Needs: Home Health Coordination, Home Infusion Coordination  Is this patient eligible for [facility]?: Yes  Has the patient been informed that they will be contacted by the [facility] program?: Yes      FREEDOM OF CHOICE/PATIENT CHOICE   Treatment Preferences: Addressed  Discharge Goals: Addressed  Freedom of Choice: Home Health Agency, Durable Medical Equipment  Restricted Services Transfer: Educated and Provided, Patient Agreeable to Transfer  Restricted Services Date Provided: 06/21/24  Aidin Choice List: Provided to Pt/Family  Date Provided: 06/21/24         Home Health Coordination   Agency Name: [facility]  Contact Person: intake  Phone Number(s): [phone]  Start of Care Date: 06/25/24  Services: Nursing      [facility] supplying meds.                       Charlie Satchel, RN CM  [facility]  Ph: [phone]  Fax [phone]

## 2024-06-24 NOTE — Unmapped
 [id number]   Flushing Your PICC Line at Home   Your peripherally inserted central catheter (PICC) line is used to deliver medicine or feedings. It?s a long, flexible tube (catheter) that goes into your vein, runs into larger veins, and ends up with the tip near where the superior vena cava enters your heart. To care for your PICC line, you will need to flush it. This means you?ll need to clean it with a solution as directed by your health care provider. This keeps it from getting clogged or blocked. A clogged or blocked PICC line will need to be taken out and replaced.   When to flush your PICC line   You?ll need to flush your PICC line as often as directed by your health care provider. You may need to flush it after each use. If the PICC line is not in active use, you may need to flush it once a day. Or you may only need to flush it once a week. Talk with your provider about how often you should do this.       What you?ll need   ?  Flushing solution. This is the liquid that you will send through the PICC line. Your health care provider will tell you what kind to use. In most cases, it is saline solution. This is a sterile mix of water and a tiny amount of salt. Your provider will also tell you how much to use. You may also need to flush with a heparin solution after the saline. Heparin is a medicine that thins the blood. It helps prevent blood from clotting in and around the catheter.   ?  A syringe. This is the device used to give an injection, or shot. A syringe is used to flush your PICC line with the solution. You will probably use prefilled syringes.   ?  Alcohol wipes or rubbing alcohol and cotton balls. You?ll use these to clean some of the tools used to flush your line. This helps to prevent germs from going into your PICC line.   ?  Clean medical gloves.       How to flush your PICC line   Repeat these steps as often as your health care provider has instructed. Skip steps 2 and 3 if you are using prefilled syringes.   Step 1. Wash your hands   ?  Wash your hands well with soap and clean running water for at least 20 seconds. Scrub them well, including the backs of your hands and between your fingers.   ?  If you don?t have access to soap and water, use an alcohol-based hand sanitizer. The gel should have at least 60% alcohol. Let the hands dry.   ?  Put on clean medical gloves.   ?  Only touch your PICC line with clean hands and when wearing clean gloves. This is to protect you from infection and to keep the line free from germs.   Step 2. Fill the syringe   ?  Open a new bottle of the flushing solution. If you?re using a bottle that?s already open, use the alcohol to clean the top of the bottle.   ?  Remove the cap from the needle or tip of the syringe. Push the plunger of the syringe down all the way.   ?  Put the needle or tip of the syringe into the flushing solution.   ?  Pull the syringe plunger out. Stop when you have the right  amount of flushing solution in the syringe. Your health care provider will tell you how much to use.   Step 3. Remove air from the syringe   ?  Hold the syringe with the tip pointing up.   ?  Flick or tap the syringe with your finger. This will cause any large air bubbles to rise into the tip.   ?  Slowly push on the plunger until a tiny drop of flushing solution comes out of the needle or tip.   ?  Put the cap back on the needle or tip of the syringe. This will keep it germ-free until you use it.   Step 4. Inject the flushing solution   ?  Scrub the top and sides of the port (end of the catheter) with an alcohol wipe for 15 seconds. Scrub using a twisting motion as if juicing an [place]. Let it dry completely. Prevent it from touching anything while drying. Don't blow on it. Don't reuse the alcohol wipe. Keep the port from touching anything until you connect the syringe. If you accidentally touch the port, clean it again.   ?  Open the clamp, if there is one.   ?  Take the cap off the needle or tip of the syringe. Insert the needle or tip into the port. Make sure you know if your PICC has a needleless connector. Ask your health care provider if you aren't sure.   ?  Push the plunger in slowly and smoothly. Don?t force the plunger. You shouldn?t feel any pressure when you push the fluid into the PICC line. If you do, stop and call your provider right away.       Step 5. Finish flushing   ?  If there is a clamp, close it just before the syringe is empty. This stops blood from flowing back into the catheter.   ?  Remove the needle or tip of the syringe from the port.   ?  Put the syringe into a special container (sharps container).       When to contact your doctor   Contact your health care provider right away if you have:   ?  A fever of 100.4?F (38?C) or higher, or as directed by your provider.   ?  Chills during or after flushing your line.   ?  Swelling, redness, drainage, or pain around the PICC site.   ?  Bleeding from the PICC site.   ?  Tubing that leaks or is pulling out.   ?  A feeling of new resistance when flushing the PICC line, or you can't flush it at all.   ?  Medicine or fluids that don't drain from the bag into your PICC.     Last Reviewed Date: 11/18/2023 00:00:00   ? 2000-2025 [employer], [place]. All rights reserved. This information is not intended as a substitute for professional medical care. Always follow your healthcare professional's instructions.

## 2024-06-24 NOTE — Unmapped
 What is a Peripherally Inserted Central Catheter? - Video   Learn how a peripherally inserted central catheter device is used to safely deliver long-term medications and draw blood samples.   To view the video go to this web address:   https://bit.[email or web address]/[email or web address]   Or, scan this QR code with your smart phone      ? [employer]

## 2024-06-24 NOTE — Progress Notes
 Physical Therapy Treatment      PATIENT: Daryl Graves  MRN: [id number]    Treatment Date: 06/24/2024    Patient Presentation: Position: In bed;Family/CG present  Lines/devices Drains: HLIV;JP Drain/s    Pertinent Updates: TTWB RLE. NO FLEXION    Precautions   Precautions: Fall risk  Orthotic: None  Current Activity Order: Activity as tolerated  Weight Bearing Status: Touch Down/Toe Touch Weight Bearing    Cognition   Cognition: Within Defined Limits  Safety Awareness: Good awareness of safety precautions  Barriers to Learning: None    Bed Mobility   Supine Scooting: Independent  Rolling: Independent  Supine to Sit: Independent  Sit to Supine: Independent    Functional Mobility   Sit to Stand: Stand by Assist  Ambulation Distance (Feet): 40FT  Gait Pattern: Step to Gait  Wheelchair: Not Performed  Stairs: Not Performed     Balance   Sitting - Static: Good  Sitting - Dynamic: Good  Standing - Static: Fair;with UE support  Standing - Dynamic: Fair;with UE support         AM-PAC   AM-PAC Basic Mobility Raw Score: 24  AM-PAC Basic Mobility t-Scale Score: 57.68  AM-PAC Basic Mobility CMS 0-100% Score: 0 %      Pain Assessment   Patient complains of pain: Yes  Pain Quality: Aching  Pain Scale Used: Numeric Pain Scale  Pain Intensity: 4/10  Pain Location: Left;Knee  Action Taken: Nursing notified    Patient Status   Activity Tolerance: Good  Oxygen Needs: Room Air  Response to Treatment: Tolerated treatment well;Vital signs stable;Pain;with activity;Resolved with rest  Compliance with Precautions: Good  Call light in reach: Yes  Presentation post treatment: In bed;Family/CG present    Interdisciplinary Communication   Interdisciplinary Communication: Occupational Therapist    Treatment Plan   Continue PT Treatment Plan with Focus on: Bed mobility training;Transfer training;Gait training;Range of motion;Therapeutic exercise;Education on precautions;Patient/family/caregiver education and training    PT Recommendations Discharge Recommendation: Would benefit from continued therapy  Discharge concerns: Requires supervision for self care;Requires supervision for mobility  Discharge Equipment Recommended: Defer to discharge facility;If patient dc home instead of rehab as recommended;Walker;Tub Bench;Commode  Comments: PATIENT TO BE DCD HOME TODAY. RECOMMEND STRENGTHENING EXERCISES,  AND GAIT TRAINING WITH FWW 2/WK UNDER SUPERVISION.  NOTE: NO KNEE FLEXION INDICATED AT THIS TIME PER M.D. ORDER.    Treatment Completed by: Sederick Jacobsen Nieto Jamaiya Tunnell, PT

## 2024-06-24 NOTE — Discharge Instructions
 F/U with Infectious Disease Doctor in 1-2 weeks

## 2024-06-24 NOTE — Unmapped
 Changing Your PICC Line Dressings - Video   Watch how and when to safely change and throw away soiled dressings, and how to recognize signs of infection at the insertion site.   To view the video go to this web address:   https://bit.ly/41E595T   Or, scan this QR code with your smart phone      ? [employer]

## 2024-06-24 NOTE — Unmapped
 Flushing Your Peripherally Inserted Central Catheter - Video   Watch how regularly and safely cleaning your PICC line helps to keep your device working properly and prevent infection.   To view the video go to this web address:   https://bit.ly/4js02wV   Or, scan this QR code with your smart phone      ? [employer]

## 2024-06-24 NOTE — Unmapped
 Your Care at Home: PICC Line - Video   If you're leaving the hospital with a PICC line, or peripherally inserted central catheter, it is important to know why you have the PICC line what questions to ask of your care provider and your role in maintaining it and avoiding infection. This video helps explains your PICC line care.   To view the video go to this web address:   https://bit.[email or web address]/[email or web address]   Or, scan this QR code with your smart phone      ? [employer]

## 2024-06-24 NOTE — Other
 Patient's Clinical Goal:   Clinical Goal(s) for the Shift: Ambulate, Pain management  Identify possible barriers to advancing the care plan:   Stability of the patient: Moderately Stable - low risk of patient condition declining or worsening   Progression of Patient's Clinical Goal:   Patient alert and oriented x4; room air; ambulatory with the walker. Denies need for pain med. Bmx1. Still with JP drain.  On Cefazolin IV. IV saline lock. For possible discharge with 6 weeks antibiotic treatment.

## 2024-06-24 NOTE — Unmapped
 [id number] en   PICC Line Care   PICC stands for peripherally inserted central catheter. This is a short-term (temporary) tube that's used instead of a regular I.V. (intravenous) line.    Reasons for using a PICC line   A PICC line may be used because:   ?  It reduces the discomfort of putting in a new I.V. every time it's needed.   ?  Medicine or nutrition needs to be given over a period of weeks or even months.   ?  A PICC can stay in place longer than an I.V., so it reduces needle sticks.   ?  It reduces damage to small veins, where an I.V. is normally inserted. This can allow some substances that damage small veins to be infused safely and comfortably.   ?  A PICC may have more than one channel, so different fluids or medicines can be given at the same time.   ?  A PICC line allows for home therapy.   Your PICC will need some care to keep it clean and working. This care includes:   ?  Changing the bandage (dressing).   ?  Flushing the catheter with fluids.   ?  Changing the cap on the end of the catheter.   A nurse or other health care provider will teach you how to do each of these things. If you have any questions, contact your care team.          Home care   The following are general care guidelines that will help you care for your PICC line at home:   ?  You can use your arm. But stay away from any activity that causes pain.   ?  Don't pick at it or pull on the tubing.   ?  Don?t lift anything heavier than 10 pounds with the arm on the side of the PICC line.   ?  The PICC line and dressing should not get wet. When you bathe or shower, tape plastic wrap over the site to keep it dry.   ?  Don't put the PICC site under water. No swimming or hot tubs. If the dressing gets wet, change it right away if you've been trained to do so. If not, contact your care team.   ?  Always wash your hands with soap and clean, running water before and after touching any part of your PICC.   ?  Don't allow the tubing to hang freely. Make sure to keep the tubing covered and secured to your arm to prevent the PICC line from being pulled out by accident.   ?  Don?t use any sharp or pointy objects around the catheter. This includes scissors, pins, knives, and razors.   The following tips will help you with dressing changes:   ?  Change the dressing over the site as directed. This is usually once a week. Change it sooner if the dressing gets wet or soiled. Check the dressing daily.   ?  You or a family member may be able to do the dressing change at home. Or you may be instructed to return to the office or clinic for dressing changes.   ?  Sterile technique must be used for PICC dressing change. If your dressing is changed at home, be sure you or your family member knows the sterile dressing technique. Contact your health care provider for instructions if you need them.       Follow-up care  Follow up as advised by your health care provider.       When to get medical care   Contact your health care provider right away if any of these occur:   ?  Fever of 100.4?F (38?C) or higher, or as advised by your provider   ?  Drainage from the PICC site   ?  Swelling or bulging around the PICC site, or anywhere above the insertion site   ?  Bleeding from the PICC site   ?  Skin pulling away from the PICC site   ?  Redness, warmth, or pus at the PICC site   ?  Tubing breaks, splits, or leaks   ?  More exposed tubing (tubing seems longer), or the tubing is pulled out completely   ?  Medicine or fluids don't drain from the bag into your PICC   ?  Coughing, wheezing, or shortness of breath   ?  A racing or irregular heartbeat   ?  Muscle stiffness or trouble moving the arm     Last Reviewed Date: 11/18/2023 00:00:00   ? 2000-2025 [employer], [place]. All rights reserved. This information is not intended as a substitute for professional medical care. Always follow your healthcare professional's instructions.

## 2024-06-24 NOTE — Unmapped
 Living With Your Peripherally Inserted Central Catheter - Video   Watch these tips to keeping your long-term PICC line infection-free and in good working order, and how to recognize infections and serious health complications.   To view the video go to this web address:   https://bit.[email or web address]   Or, scan this QR code with your smart phone      ? [employer]

## 2024-06-24 NOTE — Progress Notes
 Occupational Therapy Treatment    PATIENT: Daryl Graves  MRN: [id number]    Treatment Date: 06/24/2024    Patient Presentation: Position: In bed;Bed alarm on  Lines/devices Drains: IV/PICC;JP Drain/s    Pertinent Updates: Pt cleared by RN for OT session. Received in high fowler, JP drain intact on L knee. Agreeable to participate. Resting BP at 122/62mmHg. SBA to sit EOB.Good sitting balance, denied dizziness. SBA STS and short distance functional mobility with FWW, consistently compliant with restrictions. Discussed LB dressing,(donning/doffing footwear d/t ROM restrictions L knee using AD), toileting, and bathing techniques for safety. Sup BTB, instructed on pacing. Pt left in comfortable position with all needs met.    Precautions   Precautions: Fall risk  Orthotic: None  Current Activity Order: Activity as tolerated  Weight Bearing Status: Touch Down/Toe Touch Weight Bearing    Cognition   Cognition: Within Defined Limits    Bed Mobility   Rolling: Supervised  Supine to Sit: Supervised  Sit to Supine: Supervised    Functional Transfers   Sit to Stand: Stand by Assist;Assistive Device (Comment)  Transfer: To;From;Bed  Level of Assist: Supervised  Functional Mobility: Front wheeled walker;Stand by Assist    Activities of Daily Living (ADLs)   Eating Assistance: Independent  Eating Where Assessed: Bed level  Grooming Assistance: Independent  Grooming Deficit: Teeth care  Grooming Where Assessed: Bed level  Bathing Assistance: Moderate Assist  UB Dressing Assistance: Supervised  UB Dressing Deficit: Supervision/safety;Thread RUE;Thread LUE  Toileting Assistance: Print production planner Deficit: Increased time to complete;Perineal hygiene    Balance   Sitting - Static: Good  Sitting - Dynamic: Good     AM-PAC   AM-PAC Daily Activity Raw Score: 18  AM-PAC Daily Activity t-Scale Score: 38.66  AM-PAC Daily Activity CMS 0-100% Score: 46.65 %  AM-PAC Daily Activity CMS 'G Code' Modifier: CK    Neurological (if indicated)   Neuro Deficits: No    Pain Assessment   Patient complains of pain: Yes  Pain Quality: Aching  Pain Scale Used: Numeric Pain Scale  Pain Intensity: 3/10  Pain Location: Left;Knee  Action Taken: Nursing notified;Patient premedicated;Positioning     Patient Status   Activity Tolerance: Good  Oxygen Needs: Room Air  Response to Treatment: Tolerated treatment well;Vital signs stable;with activity;Asymptomatic;Nursing notified  Compliance with Precautions: Fair  Call light in reach: Yes  Presentation post treatment: In bed;Lines/drains intact    Interdisciplinary Communication   Interdisciplinary Communication: Nurse    OT Recommendations   Discharge Recommendation: Occupational Therapy;Would benefit from continued therapy  Discharge concerns: Requires assistance for mobility;Requires assistance for self care  Discharge Equipment Recommended: Defer to discharge facility  Comments: may benefit from tub bench/shower chair, and 3-in-1 commode    Treatment Completed by: Maudie Erika Skelton, OT

## 2024-06-24 NOTE — Unmapped
 PICC Line (Peripherally-Inserted Central Catheter) - Video   This is a thin, flexible tube that goes from an easy-to-reach vein to a large vein in your body just above your heart. Your medical team gives you fluids, nutrition and medicine though this tube. They can also sample your blood through this tube without sticking you with another needle. A PICC line can stay in your body for a much longer time than a regular IV.   To view the video go to this web address:   https://bit.[email or web address]   Or, scan this QR code with your smart phone      ? 2020 [employer]

## 2024-06-24 NOTE — Discharge Summary
 Discharge Summary   Name: Daryl Graves MRN: [id number] DOB: May 05, 1951   Admit Date: 06/19/2024 D/C Date:   06/24/2024 LOS:   5 days    Admit Attending: Gwenn SQUIBB. 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 Cyran on 06/19/2024.        Hospital Course:    Postop the patient was seen in consultation by Dr Winferd from ID upon whose recommendations he was empirically started on IV vancomycin and IV ceftriaxone.  Intraop cultures came back positive for Staphylococcus lugdunensis and per culture sensitivity data antibiotic coverage was narrowed to IV cefazolin 2 g q.8 hours.  Patient's postop course was otherwise uncomplicated.  He received pain control, PT OT, DVT prophylaxis per postop protocol.  He was discharged home with home health for PT OT and home IV antibiotics in the form of IV cefazolin for 6 weeks.  Patient will follow-up closely with Dr.Cyran and ID on an outpatient basis.            LACE+ Score: 27 (06/24/24 0801) Minimal Risk of Readmission        Pending Labs   The following tests have been collected, but not yet resulted at the time of discharge.    Unresulted Labs (From admission, onward)      None          The following labs have a preliminary result at the time of discharge.  Please check back if the final results have changed.  Preliminary Resulted Labs (From admission, onward)       Start     Ordered    06/19/24 1950  Fungal Culture, Tissue  [[id number]]  Once,   Routine        References:    [facility] Test Directory Information    06/19/24 1941    06/19/24 1950  Fungal Culture, Tissue  [[id number]]  Once,   Routine        References:    [facility] Test Directory Information    06/19/24 1944    06/19/24 1950  Fungal Culture, Tissue  [[id number]]  Once,   Routine        References:    [facility] Test Directory Information    06/19/24 1947    06/19/24 1930  Fungal Culture, Tissue  [[id number]]  Once,   Routine        References:    [facility] Test Directory Information    06/19/24 1924  Discharge Medication List  Coumadin and Opiate Risk Assessment   This patient does not have coumadin on their discharge med list  Controlled meds:   Controlled Medications       Opioid Combinations Disp Start End     oxyCODONE-acetaminophen  10-325 mg tablet 40 tablet 06/24/2024 06/24/2025    Sig - Route: Take 1 tablet by mouth every six (6) hours as needed for Moderate Pain (Pain Scale 4-6) or Severe Pain (Pain Scale 7-10). Max Daily Amount: 4 tablets - Oral    Earliest Fill Date: 06/24/2024       Benzodiazepines Disp Start End     ALPRAZolam  0.5 mg tablet (Discontinued) 2 tablet 05/16/2024 06/24/2024    Sig: Take one pill one hour before MRI. If needed may have another. Do not drive. Have driver arranged    Patient not taking: Reported on 06/20/2024              Changes To My Medications      PAUSE taking these medications     azaTHIOprine 50 mg tablet; Wait to take this until your doctor or other   care provider tells you to start again.; Dose: 100 mg; Take 2 tablets (100   mg total) by mouth daily with dinner.; Refills: 0; Commonly known as:   Imuran   OZEMPIC (2 MG/DOSE) SC; Wait to take this until your doctor or other   care provider tells you to start again.; Dose: 2 mg; Inject 2 mg under the   skin once a week.; Refills: 0     START taking these medications     ceFAZolin in SWFI 100 mg/mL IV inj (from 1 g vial); Dose: 2 g; 20 mLs (2   g total) by IV Push route every eight (8) hours.; Refills: 0   naloxone 4 mg/0.1 mL nasal spray; Call 911. Administer a single spray   intranasally into one nostril for opioid overdose. May repeat in 3 minutes   if patient is not breathing.; Quantity: 2 each; Refills: 1; Commonly known   as: Narcan   oxyCODONE-acetaminophen  10-325 mg tablet; Dose: 1 tablet; Take 1 tablet   by mouth every six (6) hours as needed for Moderate Pain (Pain Scale 4-6)   or Severe Pain (Pain Scale 7-10). Max Daily Amount: 4 tablets; Quantity:   40 tablet; Refills: 0; Commonly known as: Percocet   rivaroxaban 10 mg tablet; Dose: 10 mg; Take 1 tablet (10 mg total) by   mouth daily for 14 days.; Quantity: 14 tablet; Refills: 0; Commonly known   as: Xarelto     CONTINUE taking these medications     ASHWAGANDHA PO; Dose: 1,000 capsule; Take 1,000 capsules by mouth   daily.; Refills: 0   atorvastatin 10 mg tablet; Dose: 10 mg; Take 1 tablet (10 mg total) by   mouth at bedtime.; Refills: 0; Commonly known as: Lipitor   celecoxib  200 mg capsule; Dose: 200 mg; TAKE 1 CAPSULE BY MOUTH TWO   TIMES DAILY.; Quantity: 180 capsule; Refills: 2; Commonly known as:   CeleBREX    cetirizine 10 mg tablet; Dose: 10 mg; Take 1 tablet (10 mg total) by   mouth daily.; Refills: 0; Commonly known as: ZyrTEC   CINNAMON PO; Dose: 2,000 mg; Take 2,000 mg by mouth daily.; Refills: 0   COQ-10 PO; Dose: 1 tablet; Take 1 tablet by mouth daily.; Refills: 0   cyanocobalamin 1000 mcg tablet; Dose: 5,000 mcg; Take 5 tablets (5,000   mcg total) by mouth daily.;  Refills: 0; Commonly known as: Cyanocobalamin   fish oil 1000 mg capsule; Dose: 1 g; Take 1 capsule (1 g total) by mouth   daily.; Refills: 0   JARDIANCE 10 mg tablet; Dose: 10 mg; Take 1 tablet (10 mg total) by   mouth every morning.; Refills: 0; Generic drug: empagliflozin   LUMIGAN 0.01 % ophthalmic solution; Dose: 1 drop; Place 1 drop into the   right eye every other day.; Refills: 0; Generic drug: bimatoprost   magnesium oxide 400 mg tablet; Dose: 400 mg; Take 1 tablet (400 mg   total) by mouth every other day.; Refills: 0   mesalamine 1.2 g DR tablet; Dose: 4.8 g; Take 4 tablets (4.8 g total) by   mouth daily with dinner.; Refills: 0; Commonly known as: Lialda   multivitamin tablet; Dose: 1 tablet; Take 1 tablet by mouth daily.;   Refills: 0   PRESERVISION AREDS 2 PO; Dose: 2 tablet; Take 2 tablets by mouth daily.;   Refills: 0   PROSTATE HEALTH PO; Dose: 1 capsule; Take 1 capsule by mouth daily.;   Refills: 0   silver sulfADIAZINE 1% cream; Dose: 1 Application; Apply 1 Application   topically two (2) times daily.; Quantity: 1 g; Refills: 0; Commonly known   as: Silvadene   Vitamin D 50 mcg (2000 units) Caps; Dose: 1 capsule; Take 1 capsule (50   mcg total) by mouth daily.; Refills: 0   Zinc 50 MG Tabs; Dose: 50 mg; Take 50 mg by mouth every other day.;   Refills: 0     STOP taking these medications     ALPRAZolam  0.5 mg tablet; Commonly known as: Xanax    aspirin 81 mg EC tablet   doxycycline  hyclate 100 mg tablet   finasteride 5 mg tablet; Commonly known as: Proscar   tamsulosin 0.4 mg capsule; Commonly known as: Flomax      Requested Appointments  There are no active discharge follow-up orders for this encounter.     Scheduled Appointments  Future Appointments   Date Time Provider Department Center   07/02/2024  1:00 PM Maurene Gwenn SQUIBB., MD [facility]   07/23/2024  1:00 PM Marvis Margarie RAMAN., PA-C [facility] [id number]   11/11/2024  1:00 PM Grace Tita LABOR., PA-C [facility] [id number]          CBC:  Lab Results   Component Value Date WBC 4.07 (L) 06/23/2024    HGB 9.8 (L) 06/23/2024    HCT 29.9 (L) 06/23/2024    PLT 197 06/23/2024     LFT:  Lab Results   Component Value Date    TOTPRO 6.4 06/24/2024    ALBUMIN 2.8 (L) 06/24/2024    BILITOT 0.3 06/24/2024    ALKPHOS 66 06/24/2024    AST 17 06/24/2024    ALT 17 06/24/2024       BMP:  Lab Results   Component Value Date    NA 137 06/24/2024    K 4.0 06/24/2024    CL 106 06/24/2024    CO2 30 06/24/2024    BUN 12 06/24/2024    CREAT 0.60 (L) 06/24/2024    CALCIUM 9.3 06/24/2024    GLUCOSE 146 (H) 06/24/2024       Cal/iCal/MG/Ph:  Lab Results   Component Value Date    CALCIUM 9.3 06/24/2024    MG 1.3 (L) 06/21/2024    PHOS 2.7 06/21/2024     Coags:  No results found for: ''PT'', ''APTT'', ''INR''  But the apocrine    Relevant Imaging Studies (most recent results only):   Last CXR: XR chest ap (1 view)  Result Date: 06/25/2024  IMPRESSION:  Interval placement of right PICC line with distal tip terminating at the cavoatrial junction. Mild cardiomegaly. Mildly tortuous thoracic aorta with atherosclerotic calcifications. Low lung volumes with bronchovascular crowding. Patchy bibasilar opacities likely represent atelectasis. No definite consolidation. Mild pulmonary vascular congestion and perihilar edema. Minimal blunting of bilateral costophrenic angles, which may be secondary to small pleural effusions or soft tissue thickening/atelectasis. No pneumothorax. No acute osseous abnormalities. Demineralized bones with degenerative changes of the thoracic spine. Signed by: Fonda Ashworth   06/25/2024 6:11 AM     Procedures & Operations Performed:   Removal of LEFT TKR, I&D, placement of cement antibiotic spacer, sinus tract excision          Nutrition Recommendations & Malnutrition Assessment   I have seen and examined the patient and agree with the RD assessment and plan. See RD note from this hospital admission for additional details.     Patient meets criteria for Patient does not meet criteria for Malnutrition at this time    (current weight (!) 106.8 kg (235 lb 6.4 oz), BMI 31.06; IBW 83 kg/184 lbs, IBW %: 128)     Continue carbohydrate controlled  Monitor po intake for need of Boost glucose control    Author: Ghazal Mahdavian, RDN    06/21/2024 1:45 PM       Discharge Diet Orders:  2gm Sodium or low salt   As directed      2000mg  or less of sodium per day. Do not use salt. Avoid foods with added   salt such as highly processed foods, packaged foods, salty foods, and   foods from restaurants. To season your food, use herbs, spices, vinegar,   lemon, lime, or other salt-free herb/spice blends.    Low fat   As directed      Eat more fresh fruits, vegetables, whole grains, nuts, and seeds as able.   Eat more plant-based, protein-rich foods.  Avoid marbled meats and pork,   cheeses, and high-fat dairy products.      Skin/Wound         Discharge Activity Orders    Other Activity Precautions   As directed      AS TOLERATED    Walk with assistive device (use a cane, or walker when walking)   As   directed      walker    Walk with help (have someone with you while you are walking)   As   directed        Rehab Assessment   I have seen and examined the patient and agree with the PT assessment detailed below:  Inpatient Recommendation: PT treatment (06/20/24 1853)  Recommendation  Discharge Recommendation: Physical Therapy;Would benefit from continued therapy;Would benefit from intensive therapy upon discharge;Able to tolerate 3+ hours of therapy per day (06/23/24 1445)  Discharge concerns: Requires supervision for mobility;Requires supervision for self care;Requires assistance for mobility;Requires assistance for self care (06/23/24 1445)  Discharge Equipment Recommended: Defer to discharge facility;If patient dc home instead of rehab as recommended;Walker;Tub Bench;Commode (06/23/24 1445)  Comments: currently recommending high intensity therapy due to patient requiring assist for mobility and ADLs with risk for falls; patient's wife states worry about caring for husband at home; to be further assessed -- may tolerate low intensity therapy with family assist with proper DME and further caregiver training. (06/21/24 1627)  Present During Evaluation: Limitation of activities due to disability (Z73.6)      I have seen and examined the patient and agree with the PT assessment detailed below:  Inpatient Recommendation: OT treatment (06/20/24 1645)  Recommendation  Discharge Recommendation: Occupational Therapy;Would benefit from continued therapy (mod intensity) (06/20/24 1647)  Discharge concerns: Requires assistance for mobility;Requires assistance for self care (06/20/24 1647)  Discharge Equipment Recommended: Defer to discharge facility (06/20/24 1647)  Comments: may benefit from tub bench/shower chair, and 3-in-1 commode (06/20/24 1647)           Case Manager/Home Health Assessment   Discharge Information  Discharge Address: [address] [place] [address]. Discharging home. (06/24/24 1357)  Contact Name: Sundiata (06/24/24 1357)  Relationship: Self (06/24/24 1357)  Contact Number(s): [phone] (06/24/24 1357)  Is patient/family informed of discharge?: Yes (06/24/24 1357)  Is patient/family agreeable of discharge destination?: Yes (06/24/24 1357)  Support Systems: Spouse (06/24/24 1357)  Medicare Important Message Provided: Yes (06/24/24 1357)  Final Discharge Needs: Home Health Coordination;Home Infusion Coordination (06/24/24 1357)  Is this patient eligible for [facility]click?: Yes (06/24/24 1357)  Has the patient been informed that they will be contacted by the [facility] program?: Yes (06/24/24 1357)  Home Health Coordination  Agency Name: [facility] (06/24/24 1357)  Contact Person: intake (06/24/24 1357)  Phone Number(s): [phone] (06/24/24 1357)  Start of Care Date: 06/25/24 (06/24/24 1357)  Services: Nursing (06/24/24 1357)  Home Infusion Coordination  Agency Name: [facility] (06/24/24 1357)  Contact Person: intake (06/24/24 1357)  Phone Number(s): [phone] (06/24/24 1357)  Start of Care Date: 06/25/24 (06/24/24 1357)  Services: PICC Line 6 weeks abx's (06/24/24 1357)        Home Health Orders   Home Health BMP   As directed      Home Health IV Antibiotics   As directed      Current Inpatient IV Antibiotics Orders (For Physician Reference Only):       ============================    Home Health LFT   As directed      Home Health Multiple or Other Labs (Specify)   As directed      Labs:ESR weekly x 6 weeks    Home Health Occupational Therapy   As directed      Occupational therapy works with the patient/family/caregiver in regards   to how to perform activities of daily living (ADLs) as independently as   possible, in addition to providing recommendations for adaptive equipment,   exercises to improve/maintain upper extremity ROM/strength, etc.    Home Health Physical Therapy   As directed      Home Health Skilled Nursing   As directed        Goals of Care Note (if new for this encounter)  No notes of this type exist for this encounter.        I have seen and examined the patient on their discharge day. I have reviewed, edited, and agree with the above discharge summary. More than 30 minutes was personally spent on discharge planning on the day of discharge in coordination of care, counseling the patient and preparation of discharge.  Ollis FABIENE Grew, MD  06/24/2024 2:41 PM

## 2024-06-25 MED ADMIN — OXYCODONE HCL 5 MG PO TABS: 5 mg | ORAL | @ 01:00:00 | Stop: 2024-06-25 | NDC 68084035411

## 2024-07-02 ENCOUNTER — Ambulatory Visit: Payer: PRIVATE HEALTH INSURANCE

## 2024-07-02 DIAGNOSIS — T8454XS Infection and inflammatory reaction due to internal left knee prosthesis, sequela: Principal | ICD-10-CM

## 2024-07-02 NOTE — Progress Notes
 Postop check wound check status post explantation of infected total knee.      History of present illness patient now 2 weeks status post explantation of left total knee.  Patient has had antibiotic spacer placed.  Patient denies fevers chills reports decreased pain as opposed to prior to surgery.  Tolerating antibiotics well.  He is off pain medication at this time.      Exam the incision is clean dry intact minimal erythema.  The drain is putting out roughly 60 cc a day of serosanguineous fluid.      The open wound still measures roughly 2 x 2 but with a significant decrease the drainage scant amount.      Assessment plan 73 year old male status post explantation.  Patient to return next week for possible wound check suture removal etc. and possible drain removal.    Inflammatory labs ordered, as well as CBC and Chem 7.

## 2024-07-09 ENCOUNTER — Ambulatory Visit: Payer: Commercial Managed Care - HMO

## 2024-07-10 LAB — Fungal Culture
FUNGAL CULTURE: NEGATIVE
FUNGAL CULTURE: NEGATIVE
FUNGAL CULTURE: NEGATIVE
FUNGAL CULTURE: NEGATIVE

## 2024-07-16 ENCOUNTER — Ambulatory Visit: Payer: PRIVATE HEALTH INSURANCE

## 2024-07-16 DIAGNOSIS — T8454XS Infection and inflammatory reaction due to internal left knee prosthesis, sequela: Principal | ICD-10-CM

## 2024-07-16 NOTE — Progress Notes
 One month follow up after left knee infected total joint removal and antibiotic spacer placement.    History of present illness: 73 year old male roughly 1 month status post removal of infected hardware a colonized hardware left knee followed by antibiotic spacer he is here now for suture removal.  Patient reports roughly constant anywhere from 70-50 cc of clear serosanguineous fluid out of the drain.  Patient denies any significant drainage out of the chronic hole on the lateral part of the knee.      On exam the incision is clean dry intact the incision can be Steri-Strips at this point.  Drain site is clean dry intact as well no erythema.  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  If there is still some small tendinous portion visible from the previous gastroc flap.  This has been debrided until bleeding and we bandage.      Assessment plan 73 year old male status post removal of infected total knee now with antibiotic spacer.  To continue the drain at this point follow up in 1-2 weeks to see if the drain can be removed as new wished to prevent any sinus tract from forming at the previous sinus tract

## 2024-07-22 ENCOUNTER — Other Ambulatory Visit: Payer: Commercial Managed Care - HMO

## 2024-07-22 NOTE — Progress Notes
 INTERIM HISTORY:     The patient is seen in follow-up after nerve conduction test EMG of the ***.    The patient reports      []  {Desc; mild/moderate/severe:10411} pain   []  Pain is located   []  Numbness and tingling  []  Triggering   []  Weakness   []  Stiffness  []  Swelling  []  Attending OT   []  Using a splint      REFERRING PHYSICIAN: None; Self-Referred    HISTORY OF PRESENT ILLNESS: Daryl Graves is a 73 y.o. right-hand-dominant male who  presents today for an orthopedic evaluation of their right hand. The patient complains of triggering at the right index finger. The patient also complains of locking at the left ring finger which he has to manually unlock. The patient also complains of numbness and tingling at the bilateral radial 4 digits. Symptoms began without specific trauma or injury approximately 6 months ago. His pain is sharp in quality. It is a 8/10 in severity and intermittent in frequency. Symptoms have been worsening over the passage of time. Symptoms are alleviated with rest and aggravated with use.  The patient reports limitations to opening water bottles and making a fist.     The patient denies numbness and tingling and neck pain except as noted above.     Prior treatment has included:   [x]   No previous treatment   []   Ice/Heat   []   NSAID's  []   Physical therapy   []   Cortisone injection   []   Bracing  []   Pain medication   []   Surgery    PAST MEDICAL HISTORY:   Past Medical History:   Diagnosis Date    Arthritis     Bone spur of femur     right    BPH (benign prostatic hyperplasia)     Coccidioidomycosis     Diabetes mellitus (HCC/RAF)     Hyperlipidemia     Ulcerative colitis (HCC/RAF)        MEDICATIONS:   Current Outpatient Medications:     ASHWAGANDHA PO    atorvastatin 10 mg tablet    [Paused] azaTHIOprine 50 mg tablet    ceFAZolin in SWFI 100 mg/mL IV inj (from 1 g vial)    CELECOXIB  200 mg capsule    cetirizine 10 mg tablet    Cholecalciferol (VITAMIN D) 50 mcg (2000 units) CAPS CINNAMON PO    Coenzyme Q10 (COQ-10 PO)    cyanocobalamin 1000 mcg tablet    empagliflozin (JARDIANCE) 10 mg tablet    fish oil 1000 mg capsule    LUMIGAN 0.01 % ophthalmic solution    magnesium oxide 400 mg tablet    mesalamine (LIALDA) 1.2 g DR tablet    Misc Natural Products (PROSTATE HEALTH PO)    Multiple Vitamins-Minerals (PRESERVISION AREDS 2 PO)    multivitamin tablet    naloxone 4 mg/0.1 mL nasal spray    oxyCODONE-acetaminophen  10-325 mg tablet    [Paused] Semaglutide (OZEMPIC, 2 MG/DOSE, SC)    silver sulfADIAZINE 1% cream    Zinc 50 MG TABS    ALLERGIES:   Allergies   Allergen Reactions    Ibuprofen Other (See Comments)     Sores in mouth       PAST SURGICAL/HOSPITALIZATION HISTORY:   Past Surgical History:   Procedure Laterality Date    bilateral knee replacements      [id number]    bilateral thumb sx      CATARACT EXTRACTION  Left     HIP SURGERY Right     for bone spur    left knee sx Left     for ganglion cyst    left shoulder sx      [place]    right shoulder sx      with hamilton    VASECTOMY         SOCIAL HISTORY:   Social History     Tobacco Use    Smoking status: Never    Smokeless tobacco: Never   Substance Use Topics    Alcohol use: Not Currently    Drug use: Never       FAMILY HISTORY:   Family History       Relation Problem Comments    Mother (Deceased)        Father (Deceased)    Cancer                REVIEW OF SYSTEMS:        05/10/2024     1:42 PM   Review of Systems   Fevers No   Unusual fatigue No   Very sudden vision change No   Eye pain or irritation No   Runny nose No   Sore throat No   Chest pain No   Swelling of legs/ankles No   Cough No   Shortness of breath No   Nausea No   Vomiting No   Abdominal pain No   Diarrhea No   Constipation No   Genital lesions No   Unusual discharge No   Pain on urination No   Body aches No   Joint aches Yes   Skin lesions Yes   Rash No   Weakness No   Numbness Yes   Depression No   Anxiety No   Weight change No   Hair loss No   Unusually frequent urination No   Bleeding No   Swollen lymph nodes (glands) No   Allergies No   Unusually frequent infections No         VITALS:   There were no vitals filed for this visit.      BMI: There is no height or weight on file to calculate BMI.    PHYSICAL EXAMINATION:   GENERAL: No acute distress, pleasant  HEENT: Anicteric, normocephalic, normal extraocular movements  NECK: Normal range of motion  PSYCHIATRIC: Normal mood, normal affect  RESPIRATIONS: Normal, unlabored  SKIN: Normal color, tone, and turgor  NEUROLOGIC: Cranial nerves II-XII grossly intact  MUSCULOSKELETAL: Normal gross motion of lower extremities    EXTREMITY:     Right Hand    Thumb: []  Crepitus, []  A1 Tender, []  A1 Nodule, []  A1 Ganglion, []  Triggering, []  No Interphalangeal joint hyperextension, []  No Interphalangeal joint motion    Index finger: []  Crepitus, []  A1 Tender, []  A1 Nodule, []  A1 Ganglion, []  Triggering, []  EDC Subluxation, []  Dupuytren's Nodule, []  Extensor Lag PIP, Tip to DPC 0 cm    Middle finger: [x]  Crepitus, []  A1 Tender, []  A1 Nodule, []  A1 Ganglion, [x]  Triggering, []  EDC Subluxation, []  Dupuytren's Nodule, []  Extensor Lag PIP, Tip to DPC 0 cm    Ring finger: []  Crepitus, []  A1 Tender, []  A1 Nodule, []  A1 Ganglion, []  Triggering, []  EDC Subluxation, []  Dupuytren's Nodule, []  Extensor Lag PIP, Tip to DPC 0 cm    Small finger: []  Crepitus, []  A1 Tender, []  A1 Nodule, []  A1 Ganglion, []  Triggering, []  EDC Subluxation, []  Dupuytren's Nodule, []   Extensor Lag PIP, Tip to [facility] 0 cm     Right carpal tunnel exam     []  Tinel's  [x]  Median nerve compression test to the radial 4 digits  [x]  Phalen's  []  Thenar atrophy     Right cubital tunnel exam     []  Tinel's  []  Ulnar nerve subluxation  [x]  Ulnar nerve compression test to the thumb   [x]  Elbow flexion test  [x]  Interosseous atrophy  []  Weakness interosseous muscle  []  Wartenberg's sign  []  Froment sign  []  Hypothenar atrophy  [x]  Normal interosseous function with index finger adduction and middle finger abduction  []  Claw hand deformity     Left Hand    Thumb: []  Crepitus, []  A1 Tender, []  A1 Nodule, []  A1 Ganglion, []  Triggering, []  No Interphalangeal joint hyperextension, []  No Interphalangeal joint motion    Index finger: []  Crepitus, []  A1 Tender, []  A1 Nodule, []  A1 Ganglion, []  Triggering, []  EDC Subluxation, []  Dupuytren's Nodule, []  Extensor Lag PIP, Tip to DPC 0 cm    Middle finger: []  Crepitus, []  A1 Tender, []  A1 Nodule, []  A1 Ganglion, []  Triggering, []  EDC Subluxation, []  Dupuytren's Nodule, []  Extensor Lag PIP, Tip to DPC 0 cm    Ring finger: [x]  Crepitus, []  A1 Tender, []  A1 Nodule, []  A1 Ganglion, []  Triggering, []  EDC Subluxation, []  Dupuytren's Nodule, []  Extensor Lag PIP, Tip to DPC 0 cm    Small finger: []  Crepitus, []  A1 Tender, []  A1 Nodule, []  A1 Ganglion, []  Triggering, []  EDC Subluxation, []  Dupuytren's Nodule, []  Extensor Lag PIP, Tip to [facility] 0 cm     Left carpal tunnel exam     []  Tinel's  [x]  Median nerve compression test to the entire hand  [x]  Phalen's  []  Thenar atrophy    Left cubital tunnel exam     []  Tinel's  []  Ulnar nerve subluxation  [x]  Ulnar nerve compression test to the thumb  [x]  Elbow flexion test  []  Interosseous atrophy  []  Weakness interosseous muscle  []  Wartenberg's sign  []  Froment sign  []  Hypothenar atrophy  [x]  Normal interosseous function with index finger adduction and middle finger abduction  []  Claw hand deformity     DIAGNOSTIC STUDIES:     AP oblique lateral  view x-rays of the right hand ordered on 01/26/2024 in the office were reviewed with the patient. X-rays reveal     [x]  No acute fractures or dislocations  [x]  Degenerative joint disease trapezial metacarpal joint  [x]  Joint space narrowing trapezial metacarpal joint  [x]  Osteophyte trapezial metacarpal joint  []  Lateral subluxation of the first ray  []  Degenerative joint disease scaphoid trapezial trapezoid joint  []  Degenerative joint disease thumb metacarpophalangeal joint  []  Degenerative joint disease thumb interphalangeal joint    []  Degenerative joint disease metacarpophalangeal joint  [x]  Degenerative joint disease proximal interphalangeal joint  [x]  Degenerative joint disease distal interphalangeal joint  []  Osteophyte     []  Degenerative joint disease radiocarpal joint  []  Degenerative joint disease distal radioulnar joint  []  Ulnar neutral variance  []  Ulnar positive variance  []  Ulnar negative variance    AP oblique lateral  view x-rays of the left hand ordered on 01/26/2024 in the office were reviewed with the patient. X-rays reveal     [x]  No acute fractures or dislocations  [x]  Degenerative joint disease trapezial metacarpal joint  [x]  Joint space narrowing trapezial metacarpal joint  [x]  Osteophyte trapezial metacarpal joint  []  Lateral  subluxation of the first ray  []  Degenerative joint disease scaphoid trapezial trapezoid joint  []  Degenerative joint disease thumb metacarpophalangeal joint  []  Degenerative joint disease thumb interphalangeal joint    []  Degenerative joint disease metacarpophalangeal joint  []  Degenerative joint disease proximal interphalangeal joint  []  Degenerative joint disease distal interphalangeal joint  []  Osteophyte     []  Ulnar neutral variance  []  Ulnar positive variance  []  Ulnar negative variance    Nerve conduction test EMG performed by Dr. Charlie Riis on *** was reviewed with the patient. The results are noted below.  ***     Nerve conduction test EMG performed by Dr. Charlie Riis on 04/01/2024 was reviewed with the patient. The results are noted below.           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 The nerve conduction study EMG demonstrates right moderate carpal tunnel syndrome. ***.     FOLLOW UP: {Blank single:19197::''1 Tzzx'',''7 Weeks'',''2 Weeks postop'',''3 Tzzxd'',''8 Month'',''6 Tzzxd'',''7 Months'',''3 Months'',''4 Months'',''6 Months'',''1 Year'',''PRN'',''PRN per patient request'',''Preop  appointment'',''Postop  appointment'',''After  MRI'',''After  CT  scan'',''After STAT MRI'',''After STAT CT scan'',''After  MRI  and  CT  scan'',''After  studies'',''After  nerve  conduction  test  EMG'',''After  authorization  for  cortisone  injection'',''After  authorization  for  US   guided  cortisone  injection'',''After  NCS/EMG  report  obtained'',''After  authorization  for  collagenase  obtained'',''For  Telehealth  appointment''}      The patient was examined and evaluated by Margarie Kays, PA-C for Calhoun Ka, M.D. The evaluation and plan was reviewed and approved by Calhoun Ka, M.D.        Margarie Kays, PA-C    Calhoun Ka, M.D.  Hand Surgery     CC: Quintella Jacquelyn Ask, MD Quintella Jacquelyn Ask, MD

## 2024-07-23 ENCOUNTER — Ambulatory Visit: Payer: Commercial Managed Care - HMO

## 2024-07-23 ENCOUNTER — Ambulatory Visit: Payer: PRIVATE HEALTH INSURANCE

## 2024-07-23 ENCOUNTER — Ambulatory Visit: Payer: MEDICARE

## 2024-07-23 DIAGNOSIS — T8454XS Infection and inflammatory reaction due to internal left knee prosthesis, sequela: Principal | ICD-10-CM

## 2024-07-23 NOTE — Progress Notes
 Follow up after left knee explantation antibiotic spacer.      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  Drain still in place putting out roughly 50-70 cc of very light red tinged serosanguineous fluid.  Patient reports significantly decreased pain no fevers or chills.      On exam incision was clean dry intact it is healed well the drain has been removed at this point Steri-Strips closed.  The distal lateral previous sinus has closed down now to the size of a cm still with some very light colored serosanguineous fluid which barely soaks a 2 x 2 gauze.      Laboratory values previous CRP was down to normal range less than 3 last ESR was also normalized at 17.    Assessment plan 73 year old male, 4 weeks status post I in the antibiotic cement spacer now with a normalized labs on IV antibiotics.  Recommend continuing to the full 6 weeks check labs.  If labs rash and normalized at 6 weeks likely the IV antibiotics will be discontinued and future p.o. antibiotics or not we will be determined by the Infectious Disease doctor.      Indicates once off IV antibiotics the patient will be retested approximately 2 weeks afterwards and if inflammatory labs remain normal patient will be scheduled for reimplantation of the total knee replacement with the understanding that there was a small chance that the infection can be reactivated they understand this no further questions today we will be sent for labs.

## 2024-07-29 ENCOUNTER — Telehealth: Payer: PRIVATE HEALTH INSURANCE

## 2024-07-29 NOTE — Telephone Encounter
 Call Back Request      Reason for call back: Patient requesting to speak with office. Has medical questions, asked when is the last day for his antibiotics/infusion. Please assist.     Any Symptoms:  []  Yes  []  No      If yes, what symptoms are you experiencing:    Duration of symptoms (how long):    Have you taken medication for symptoms (OTC or Rx):      If call was taken outside of clinic hours:    [] Patient or caller has been notified that this message was sent outside of normal clinic hours.     [] Patient or caller has been warm transferred to the physician's answering service. If applicable, patient or caller informed to please call us  back if symptoms progress.  Patient or caller has been notified of the turnaround time of 1-2 business day(s).

## 2024-07-31 ENCOUNTER — Telehealth: Payer: PRIVATE HEALTH INSURANCE

## 2024-07-31 ENCOUNTER — Other Ambulatory Visit: Payer: Commercial Managed Care - HMO

## 2024-07-31 NOTE — Telephone Encounter
Reply by: Jeily Guthridge Jaquay Laurier Jasperson  Completed, I spoke directly to patient.

## 2024-07-31 NOTE — Telephone Encounter
 Call Back Request      Reason for call back: Patient is calling clinic back once more to speak with Delon in Dr Laray office. Patient was hoping to hear back from her before the end of today if possible. Please call patient back to advise.     CBN: [phone]    Any Symptoms:  []  Yes  [x]  No      If yes, what symptoms are you experiencing:    Duration of symptoms (how long):    Have you taken medication for symptoms (OTC or Rx):      If call was taken outside of clinic hours:    [] Patient or caller has been notified that this message was sent outside of normal clinic hours.     [] Patient or caller has been warm transferred to the physician's answering service. If applicable, patient or caller informed to please call us  back if symptoms progress.  Patient or caller has been notified of the turnaround time of 1-2 business day(s).

## 2024-07-31 NOTE — Telephone Encounter
 Call Back Request      Reason for call back: Pt is requesting to speak with Delon in regards to his infusions. Per pt, needs clarification as soon as possible.    Any Symptoms:  []  Yes  [x]  No      If yes, what symptoms are you experiencing:    Duration of symptoms (how long):    Have you taken medication for symptoms (OTC or Rx):      If call was taken outside of clinic hours:    [] Patient or caller has been notified that this message was sent outside of normal clinic hours.     [] Patient or caller has been warm transferred to the physician's answering service. If applicable, patient or caller informed to please call us  back if symptoms progress.  Patient or caller has been notified of the turnaround time of 1-2 business day(s).

## 2024-07-31 NOTE — Telephone Encounter
 Reply by: Delon Glendon Gowda    I spoke directly to patient, I will reach out to Dr. Cyran to get an answer regarding patient's infusion.

## 2024-07-31 NOTE — Progress Notes
 INTERIM HISTORY:    The patient is seen in follow-up after nerve conduction test EMG of the left upper extremity. The patient is accompanied by his wife. The patient recently underwent an explantation of infected left knee hardware and antibiotic cement spacer placement and completed his piccline IV antibiotics on 08/01/2024. He is currently being treated by Dr. Cyran. He denies elbow pain.    The patient reports    [x]  mild pain  [x]  Pain is located at the central three digits  [x]  Numbness and tingling at all five digits of the bilateral hands  [x]  Triggering at the right central three digits  []  Weakness  []  Stiffness  []  Swelling  []  Attending OT  []  Using a splint    REFERRING PHYSICIAN: None; Self-Referred    HISTORY OF PRESENT ILLNESS: Daryl Graves is a 73 y.o. right-hand-dominant male who  presents today for an orthopedic evaluation of their right hand. The patient complains of triggering at the right index finger. The patient also complains of locking at the left ring finger which he has to manually unlock. The patient also complains of numbness and tingling at the bilateral radial 4 digits. Symptoms began without specific trauma or injury approximately 6 months ago. His pain is sharp in quality. It is a 8/10 in severity and intermittent in frequency. Symptoms have been worsening over the passage of time. Symptoms are alleviated with rest and aggravated with use.  The patient reports limitations to opening water bottles and making a fist.     The patient denies numbness and tingling and neck pain except as noted above.     Prior treatment has included:   [x]   No previous treatment   []   Ice/Heat   []   NSAID's  []   Physical therapy   []   Cortisone injection   []   Bracing  []   Pain medication   []   Surgery    PAST MEDICAL HISTORY:   Past Medical History:   Diagnosis Date    Arthritis     Bone spur of femur     right    BPH (benign prostatic hyperplasia)     Coccidioidomycosis     Diabetes mellitus (HCC/RAF)     Hyperlipidemia     Ulcerative colitis (HCC/RAF)        MEDICATIONS:   Current Outpatient Medications:     ASHWAGANDHA PO    atorvastatin  10 mg tablet    [Paused] azaTHIOprine 50 mg tablet    CELECOXIB  200 mg capsule    cetirizine  10 mg tablet    Cholecalciferol (VITAMIN D) 50 mcg (2000 units) CAPS    CINNAMON PO    Coenzyme Q10 (COQ-10 PO)    cyanocobalamin 1000 mcg tablet    empagliflozin  (JARDIANCE ) 10 mg tablet    fish oil 1000 mg capsule    LUMIGAN 0.01 % ophthalmic solution    magnesium  oxide 400 mg tablet    mesalamine  (LIALDA ) 1.2 g DR tablet    Misc Natural Products (PROSTATE HEALTH PO)    Multiple Vitamins-Minerals (PRESERVISION AREDS 2 PO)    multivitamin tablet    naloxone  4 mg/0.1 mL nasal spray    oxyCODONE -acetaminophen  10-325 mg tablet    [Paused] Semaglutide (OZEMPIC, 2 MG/DOSE, SC)    Zinc  50 MG TABS    ALLERGIES:   Allergies   Allergen Reactions    Ibuprofen Other (See Comments) and Unknown (Patient does not know)     Sores in mouth  PAST SURGICAL/HOSPITALIZATION HISTORY:   Past Surgical History:   Procedure Laterality Date    bilateral knee replacements      [id number]    bilateral thumb sx      CATARACT EXTRACTION Left     HIP SURGERY Right     for bone spur    left knee sx Left     for ganglion cyst    left knee sx  06/19/2024    left shoulder sx      [place]    right shoulder sx      with [facility]    VASECTOMY         SOCIAL HISTORY:   Social History     Tobacco Use    Smoking status: Never    Smokeless tobacco: Never   Substance Use Topics    Alcohol use: Not Currently    Drug use: Never       FAMILY HISTORY:   Family History       Relation Problem Comments    Mother (Deceased)        Father (Deceased)    Cancer                REVIEW OF SYSTEMS:        05/10/2024     1:42 PM   Review of Systems   Fevers No   Unusual fatigue No   Very sudden vision change No   Eye pain or irritation No   Runny nose No   Sore throat No   Chest pain No   Swelling of legs/ankles No   Cough No Shortness of breath No   Nausea No   Vomiting No   Abdominal pain No   Diarrhea No   Constipation No   Genital lesions No   Unusual discharge No   Pain on urination No   Body aches No   Joint aches Yes   Skin lesions Yes   Rash No   Weakness No   Numbness Yes   Depression No   Anxiety No   Weight change No   Hair loss No   Unusually frequent urination No   Bleeding No   Swollen lymph nodes (glands) No   Allergies No   Unusually frequent infections No         VITALS:   There were no vitals filed for this visit.      BMI: There is no height or weight on file to calculate BMI.    PHYSICAL EXAMINATION:   GENERAL: No acute distress, pleasant  HEENT: Anicteric, normocephalic, normal extraocular movements  NECK: Normal range of motion  PSYCHIATRIC: Normal mood, normal affect  RESPIRATIONS: Normal, unlabored  SKIN: Normal color, tone, and turgor  NEUROLOGIC: Cranial nerves II-XII grossly intact  MUSCULOSKELETAL: Normal gross motion of lower extremities    EXTREMITY:     Right Hand    Thumb: []  Crepitus, []  A1 Tender, []  A1 Nodule, []  A1 Ganglion, []  Triggering, []  No Interphalangeal joint hyperextension, []  No Interphalangeal joint motion    Index finger: [x]  Crepitus, [x]  A1 Tender, []  A1 Nodule, []  A1 Ganglion, []  Triggering, []  EDC Subluxation, []  Dupuytren's Nodule, []  Extensor Lag PIP, Tip to DPC 0 cm    Middle finger: []  Crepitus, [x]  A1 Tender, []  A1 Nodule, []  A1 Ganglion, [x]  Triggering, []  EDC Subluxation, []  Dupuytren's Nodule, []  Extensor Lag PIP, Tip to DPC 0 cm    Ring finger: [x]  Crepitus, [x]  A1 Tender, []  A1  Nodule, []  A1 Ganglion, [x]  Triggering, []  EDC Subluxation, []  Dupuytren's Nodule, []  Extensor Lag PIP, Tip to DPC 0 cm    Small finger: []  Crepitus, []  A1 Tender, []  A1 Nodule, []  A1 Ganglion, []  Triggering, []  EDC Subluxation, []  Dupuytren's Nodule, []  Extensor Lag PIP, Tip to [facility] 0 cm     Right carpal tunnel exam     []  Tinel's  []  Median nerve compression test to the radial 4 digits  []  Phalen's  [x]  Thenar atrophy     Right Guyon's tunnel exam     []  Tinel's  []  Ulnar nerve compression test  []  Phalen's  []  Hypothenar atrophy  []  Interosseous atrophy  []  Weakness interosseous muscle  []  Wartenberg's sign  []  Froment sign  [x]  Hypothenar atrophy  [x]  Normal interosseous function with index finger adduction and middle finger abduction  []  Claw hand deformity    Right cubital tunnel exam     [x]  Tinel's- small finger  []  Ulnar nerve subluxation  [x]  Ulnar nerve compression test to all five digits  [x]  Elbow flexion test to all five digits  []  Interosseous atrophy  []  Weakness interosseous muscle  []  Wartenberg's sign  []  Froment sign  []  Hypothenar atrophy  [x]  Normal interosseous function with index finger adduction and middle finger abduction  []  Claw hand deformity     Left Hand    Thumb: []  Crepitus, []  A1 Tender, []  A1 Nodule, []  A1 Ganglion, []  Triggering, []  No Interphalangeal joint hyperextension, []  No Interphalangeal joint motion    Index finger: []  Crepitus, []  A1 Tender, []  A1 Nodule, []  A1 Ganglion, []  Triggering, []  EDC Subluxation, []  Dupuytren's Nodule, []  Extensor Lag PIP, Tip to DPC 0 cm    Middle finger: []  Crepitus, []  A1 Tender, []  A1 Nodule, []  A1 Ganglion, []  Triggering, []  EDC Subluxation, []  Dupuytren's Nodule, []  Extensor Lag PIP, Tip to DPC 0 cm    Ring finger: [x]  Crepitus, []  A1 Tender, []  A1 Nodule, []  A1 Ganglion, []  Triggering, []  EDC Subluxation, []  Dupuytren's Nodule, []  Extensor Lag PIP, Tip to DPC 0 cm    Small finger: []  Crepitus, []  A1 Tender, []  A1 Nodule, []  A1 Ganglion, []  Triggering, []  EDC Subluxation, []  Dupuytren's Nodule, []  Extensor Lag PIP, Tip to [facility] 0 cm     Left carpal tunnel exam     [x]  Tinel's- index and middle fingers  []  Median nerve compression test to the entire hand  []  Phalen's  [x]  Thenar atrophy    Left Guyon's tunnel exam     []  Tinel's  []  Ulnar nerve compression test  []  Phalen's  []  Hypothenar atrophy  []  Interosseous atrophy  []  Weakness interosseous muscle  []  Wartenberg's sign  []  Froment sign  [x]  Hypothenar atrophy  [x]  Normal interosseous function with index finger adduction and middle finger abduction  []  Claw hand deformity    Left cubital tunnel exam     [x]  Tinel's- small finger  []  Ulnar nerve subluxation  [x]  Ulnar nerve compression test to  all five digits  [x]  Elbow flexion test to all five digits  []  Interosseous atrophy  []  Weakness interosseous muscle  []  Wartenberg's sign  []  Froment sign  []  Hypothenar atrophy  [x]  Normal interosseous function with index finger adduction and middle finger abduction  []  Claw hand deformity     DIAGNOSTIC STUDIES:     AP oblique lateral  view x-rays of the right hand ordered on 01/26/2024 in the office  were reviewed with the patient. X-rays reveal     [x]  No acute fractures or dislocations  [x]  Degenerative joint disease trapezial metacarpal joint  [x]  Joint space narrowing trapezial metacarpal joint  [x]  Osteophyte trapezial metacarpal joint  []  Lateral subluxation of the first ray  []  Degenerative joint disease scaphoid trapezial trapezoid joint  []  Degenerative joint disease thumb metacarpophalangeal joint  []  Degenerative joint disease thumb interphalangeal joint    []  Degenerative joint disease metacarpophalangeal joint  [x]  Degenerative joint disease proximal interphalangeal joint  [x]  Degenerative joint disease distal interphalangeal joint  []  Osteophyte     []  Degenerative joint disease radiocarpal joint  []  Degenerative joint disease distal radioulnar joint  []  Ulnar neutral variance  []  Ulnar positive variance  []  Ulnar negative variance    AP oblique lateral  view x-rays of the left hand ordered on 01/26/2024 in the office were reviewed with the patient. X-rays reveal     [x]  No acute fractures or dislocations  [x]  Degenerative joint disease trapezial metacarpal joint  [x]  Joint space narrowing trapezial metacarpal joint  [x]  Osteophyte trapezial metacarpal joint  []  Lateral subluxation of the first ray  []  Degenerative joint disease scaphoid trapezial trapezoid joint  []  Degenerative joint disease thumb metacarpophalangeal joint  []  Degenerative joint disease thumb interphalangeal joint    []  Degenerative joint disease metacarpophalangeal joint  []  Degenerative joint disease proximal interphalangeal joint  []  Degenerative joint disease distal interphalangeal joint  []  Osteophyte     []  Ulnar neutral variance  []  Ulnar positive variance  []  Ulnar negative variance    Nerve conduction test EMG performed by Dr. Charlie Riis on 06/10/2024 was reviewed with the patient. The results are noted below.             Nerve conduction test EMG performed by Dr. Charlie Riis on 04/01/2024 was reviewed with the patient. The results are noted below.           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 Conservative treatment includes splint immobilization, oral nonsteroidal anti-inflammatory medications and cortisone injection. The patient understands that cortisone injection has an approximately 50% success rate with the first injection, 25% success rate with the second injection and a 3-5% success rate with the third injection. I discussed that cortisone injection in patients with symptoms of 1 month has an 88% success rate and if symptom duration approach 1 year, success rate drops to 15%. Pain can be treated with cortisone but may not help the triggering in that setting and surgery may be a better option. Risks of cortisone injection including hypopigmentation, hyperpigmentation, subcutaneous fat atrophy which are usually temporary but may be permanent, tendon rupture and transient hyperglycemia lasting up to five days were all discussed.     I discussed surgical treatment of trigger finger having a success rate approaching 99%. The patient understands that recurrent or persistent triggering may be due to another site such as the A3 pulley and may require additional surgery. No guarantees have been given.  We reviewed the nerve conduction study EMG report and values as well as the diagnosis and treatment options. The nerve conduction study EMG demonstrates severe left carpal tunnel syndrome, moderate right carpal tunnel syndrome, and right cubital tunnel syndrome. Based on the severity of the carpal tunnel syndrome on the left side, surgical treatment is indicated.  However this will be performed after the knee has been treated.  With respect to the right side, I discussed with the patient the options for treatment including no treatment, conservative treatment with nocturnal bracing, NSAIDS, and surgical treatment as a last resort.     The patient reports that he as recently completed a course of antibiotics for a knee infection. He is currently being treated by Dr. Lyndle. We discussed that we cannot offer surgery while the patient is actively being treated for infection. The patient was counseled to consult with Dr. Chadwick team and infectious disease specialist for clearance for cortisone injection and surgery.     At this time, I recommend continued conservative management with nocturnal bracing.     I recommend obtaining an MRI of the left elbow to evaluate for ulnar neuritis.    I recommend:    [x]  Modabber wrist splint  [x]  Cubital comfort splint versus towel immobilization  []  Nerve test EMG  []  Cortisone injection  [x]  Surgery at the left side, we will hold off until after the patient's knee infection is under control    Of note, the patient has diabetes mellitus. The patient's HbA1c is  5.7-5.9%.    FOLLOW UP: After  MRI      The patient was examined and evaluated by Margarie Kays, PA-C with Calhoun Ka, M.D. The evaluation and plan was reviewed and approved by Calhoun Ka, M.D.        Anoop Gill, PA-C    I have seen and examined the patient and performed the history and physical examination.  I discussed the management with the physician assistant.  I agree with the history, physical examination, assessment and plan as outlined in the documentation noted by the physician assistant.          Calhoun Ka, M.D.  Hand Surgery     CC: Quintella Jacquelyn Ask, MD Quintella Jacquelyn Ask, MD

## 2024-08-01 NOTE — Telephone Encounter
 Reply by: Delon Coon Tate Jerkins    Completed, I spoke directly to patient via my chart.

## 2024-08-01 NOTE — Progress Notes
 For follow up after left knee I and D removal of components and placement of antibiotic cement spacer.      History of present illness.  73 year old male multiple surgeries left knee chronic constant infection left knee now roughly 3 weeks status post removal components I and D placement of antibiotic cement spacer.  Patient is feeling better decreased pain denies fevers or chills on antibiotic therapy.      Left lower extremity sutures not quite ready for removal.  No erythema drainage is clear serosanguineous from the JP.  The lateral wound is closing down and being treated with wet-to-dry dressings.      Assessment plan 73 year old male 3 weeks status post I and D left knee.  Recommend continuing sutures.  Follow up in 1 week for removal.  Recommend continuing wet-to-dry dressings for wound treatment of the lateral previous draining sinus.  Recommend also possible DC of the JP drain next week in clinic.    Clinical signs were good for resolution of the infection.  Continue to follow up sedimentation rate as well as CRP.

## 2024-08-02 ENCOUNTER — Ambulatory Visit: Payer: MEDICARE

## 2024-08-02 DIAGNOSIS — G5601 Carpal tunnel syndrome, right upper limb: Principal | ICD-10-CM

## 2024-08-02 DIAGNOSIS — G5621 Lesion of ulnar nerve, right upper limb: Secondary | ICD-10-CM

## 2024-08-02 DIAGNOSIS — G5622 Lesion of ulnar nerve, left upper limb: Secondary | ICD-10-CM

## 2024-08-02 DIAGNOSIS — G5602 Carpal tunnel syndrome, left upper limb: Secondary | ICD-10-CM

## 2024-08-06 ENCOUNTER — Ambulatory Visit: Payer: PRIVATE HEALTH INSURANCE

## 2024-08-06 DIAGNOSIS — T8454XS Infection and inflammatory reaction due to internal left knee prosthesis, sequela: Principal | ICD-10-CM

## 2024-08-06 NOTE — Progress Notes
 5-6 week follow-up after explantation of chronically infected left total knee replacement and placement of antibiotic cement spacer.      History of present illness.  73 year old male round roughly 5-6 weeks status post explantation and placement of cement antibiotic spacer for chronically infected left knee.  Patient is here now for wound follow up.  The chronically draining sinus on his leg has decreased from roughly 3 cm diameter to less than 1 remains dry there is granulation tissue present.      Patient did notice 1 or 2 episodes of clear serosanguineous fluid escaping from the drain hole where the drain had previously been.      Patient denies fevers chills states that in general the leg is feeling better week by week.      At this point he has had the antibiotics discontinued roughly 5 days ago for normal inflammatory  labs for 2 weeks in a row.  Patient denies fevers or chills in the interim.      On exam the incision is clean dry intact it is healed well.  There was no erythema the leg is expected slightly warm to touch.  It is not tender or painful.      The chronically draining lateral distal thigh wound continues to shrink there was some granulation tissue present still in a small amount of tendon.  The swollen tendon has been sharply dissected until bleeding tissue was obtained and this was covered with a Band-Aid.      Patient to check in follow up labs in 2 weeks from now for an inflammation ESR CRP and CBC.  If these remain normal off antibiotics for this point at 2 weeks we can proceed with reimplantation with the understanding that the infection may reoccur despite all normal information elsewhere.  No questions at this time.

## 2024-08-06 NOTE — Addendum Note
 Addended by: Caris Cerveny JOY L on: 08/06/2024 03:48 PM     Modules accepted: Orders

## 2024-08-22 ENCOUNTER — Other Ambulatory Visit: Payer: PRIVATE HEALTH INSURANCE

## 2024-09-27 ENCOUNTER — Telehealth: Payer: MEDICARE

## 2024-09-27 NOTE — Telephone Encounter
 Call Back Request      Reason for call back: patient calling to speak to Delon in regards to new insurance and when to be scheduled with MD.    Please assist     Any Symptoms:  []  Yes  [x]  No      If yes, what symptoms are you experiencing:    Duration of symptoms (how long):    Have you taken medication for symptoms (OTC or Rx):      If call was taken outside of clinic hours:    [] Patient or caller has been notified that this message was sent outside of normal clinic hours.     [] Patient or caller has been warm transferred to the physician's answering service. If applicable, patient or caller informed to please call us  back if symptoms progress.  Patient or caller has been notified of the turnaround time of 1-2 business day(s).

## 2024-09-30 ENCOUNTER — Other Ambulatory Visit: Payer: MEDICARE

## 2024-09-30 ENCOUNTER — Other Ambulatory Visit: Payer: PRIVATE HEALTH INSURANCE

## 2024-09-30 NOTE — Telephone Encounter
 Reply by: Delon Coon Derick Seminara    Completed, I spoke directly to patient and explained with the new HMO [employer] his PCP will need to submit an authorization to see Dr. Cyran for a follow up visit. He will also send copy of new insurance card and pictures of incision to Dr. Cyran via my chart.

## 2024-09-30 NOTE — Telephone Encounter
 Call Back Request      Reason for call back:  Pt calling back to f/u on below also asking when does he need to f/u with cryan again?    Please advise and contact    Any Symptoms:  []  Yes  [x]  No      If yes, what symptoms are you experiencing:    Duration of symptoms (how long):    Have you taken medication for symptoms (OTC or Rx):      If call was taken outside of clinic hours:    [] Patient or caller has been notified that this message was sent outside of normal clinic hours.     [] Patient or caller has been warm transferred to the physician's answering service. If applicable, patient or caller informed to please call us  back if symptoms progress.  Patient or caller has been notified of the turnaround time of 1-2 business day(s).

## 2024-10-01 ENCOUNTER — Telehealth: Payer: Self-pay

## 2024-10-01 NOTE — Telephone Encounter (Signed)
 [facility] requesting update note indicating if any improvement since starting Otezla . Patient has an appointment on 10/08/24 and we will update the note then and send it to them electronically.

## 2024-10-02 ENCOUNTER — Ambulatory Visit: Payer: BLUE CROSS/BLUE SHIELD

## 2024-10-08 ENCOUNTER — Encounter: Payer: Self-pay | Admitting: Dermatology

## 2024-10-08 ENCOUNTER — Ambulatory Visit: Admitting: Dermatology

## 2024-10-08 DIAGNOSIS — L820 Inflamed seborrheic keratosis: Secondary | ICD-10-CM

## 2024-10-08 DIAGNOSIS — L578 Other skin changes due to chronic exposure to nonionizing radiation: Secondary | ICD-10-CM

## 2024-10-08 DIAGNOSIS — Z79899 Other long term (current) drug therapy: Secondary | ICD-10-CM

## 2024-10-08 DIAGNOSIS — W908XXA Exposure to other nonionizing radiation, initial encounter: Secondary | ICD-10-CM | POA: Diagnosis not present

## 2024-10-08 DIAGNOSIS — L409 Psoriasis, unspecified: Secondary | ICD-10-CM

## 2024-10-08 DIAGNOSIS — L570 Actinic keratosis: Secondary | ICD-10-CM

## 2024-10-08 DIAGNOSIS — L603 Nail dystrophy: Secondary | ICD-10-CM

## 2024-10-08 DIAGNOSIS — Z7189 Other specified counseling: Secondary | ICD-10-CM

## 2024-10-08 DIAGNOSIS — L821 Other seborrheic keratosis: Secondary | ICD-10-CM | POA: Diagnosis not present

## 2024-10-08 MED ORDER — OTEZLA 30 MG PO TABS: 30.0000 mg | ORAL_TABLET | Freq: Two times a day (BID) | ORAL | 6 refills | Status: AC

## 2024-10-08 NOTE — Patient Instructions (Addendum)
 Recommend applying OTC DermaNail conditioner to cuticle area of fingernails twice daily to help with chipping/breaking.  May take 3-6 months of regular use to get best result.  Could also add OTC Biotin supplement 2.5 mg daily to help strengthen nails.  Recommend mild soap with handwashing followed by a thick moisturizing cream to fingernails.  May use Cutemol cream,which comes with DermaNail, Neutrogena Norwegian hand cream, or Eucerin cream original.   Cryotherapy Aftercare  Wash gently with soap and water everyday.   Apply Vaseline and Band-Aid daily until healed.   Actinic keratoses are precancerous spots that appear secondary to cumulative UV radiation exposure/sun exposure over time. They are chronic with expected duration over 1 year. A portion of actinic keratoses will progress to squamous cell carcinoma of the skin. It is not possible to reliably predict which spots will progress to skin cancer and so treatment is recommended to prevent development of skin cancer.  Recommend daily broad spectrum sunscreen SPF 30+ to sun-exposed areas, reapply every 2 hours as needed.  Recommend staying in the shade or wearing long sleeves, sun glasses (UVA+UVB protection) and wide brim hats (4-inch brim around the entire circumference of the hat). Call for new or changing lesions.    Seborrheic Keratosis  What causes seborrheic keratoses? Seborrheic keratoses are harmless, common skin growths that first appear during adult life.  As time goes by, more growths appear.  Some people may develop a large number of them.  Seborrheic keratoses appear on both covered and uncovered body parts.  They are not caused by sunlight.  The tendency to develop seborrheic keratoses can be inherited.  They vary in color from skin-colored to gray, brown, or even black.  They can be either smooth or have a rough, warty surface.   Seborrheic keratoses are superficial and look as if they were stuck on the skin.  Under the  microscope this type of keratosis looks like layers upon layers of skin.  That is why at times the top layer may seem to fall off, but the rest of the growth remains and re-grows.    Treatment Seborrheic keratoses do not need to be treated, but can easily be removed in the office.  Seborrheic keratoses often cause symptoms when they rub on clothing or jewelry.  Lesions can be in the way of shaving.  If they become inflamed, they can cause itching, soreness, or burning.  Removal of a seborrheic keratosis can be accomplished by freezing, burning, or surgery. If any spot bleeds, scabs, or grows rapidly, please return to have it checked, as these can be an indication of a skin cancer.   Due to recent changes in healthcare laws, you may see results of your pathology and/or laboratory studies on MyChart before the doctors have had a chance to review them. We understand that in some cases there may be results that are confusing or concerning to you. Please understand that not all results are received at the same time and often the doctors may need to interpret multiple results in order to provide you with the best plan of care or course of treatment. Therefore, we ask that you please give us  2 business days to thoroughly review all your results before contacting the office for clarification. Should we see a critical lab result, you will be contacted sooner.   If You Need Anything After Your Visit  If you have any questions or concerns for your doctor, please call our main line at [phone] and press option  4 to reach your doctor's medical assistant. If no one answers, please leave a voicemail as directed and we will return your call as soon as possible. Messages left after 4 pm will be answered the following business day.   You may also send us  a message via MyChart. We typically respond to MyChart messages within 1-2 business days.  For prescription refills, please ask your pharmacy to contact our  office. Our fax number is [phone].  If you have an urgent issue when the clinic is closed that cannot wait until the next business day, you can page your doctor at the number below.    Please note that while we do our best to be available for urgent issues outside of office hours, we are not available 24/7.   If you have an urgent issue and are unable to reach us , you may choose to seek medical care at your doctor's office, retail clinic, urgent care center, or emergency room.  If you have a medical emergency, please immediately call 911 or go to the emergency department.  Pager Numbers  - Dr. Hester: [phone]  - Dr. Jackquline: [phone]  - Dr. Claudene: [phone]   - Dr. Raymund: [phone]  In the event of inclement weather, please call our main line at [phone] for an update on the status of any delays or closures.  Dermatology Medication Tips: Please keep the boxes that topical medications come in in order to help keep track of the instructions about where and how to use these. Pharmacies typically print the medication instructions only on the boxes and not directly on the medication tubes.   If your medication is too expensive, please contact our office at [phone] option 4 or send us  a message through MyChart.   We are unable to tell what your co-pay for medications will be in advance as this is different depending on your insurance coverage. However, we may be able to find a substitute medication at lower cost or fill out paperwork to get insurance to cover a needed medication.   If a prior authorization is required to get your medication covered by your insurance company, please allow us  1-2 business days to complete this process.  Drug prices often vary depending on where the prescription is filled and some pharmacies may offer cheaper prices.  The website www.goodrx.com contains coupons for medications through different pharmacies. The prices here do not  account for what the cost may be with help from insurance (it may be cheaper with your insurance), but the website can give you the price if you did not use any insurance.  - You can print the associated coupon and take it with your prescription to the pharmacy.  - You may also stop by our office during regular business hours and pick up a GoodRx coupon card.  - If you need your prescription sent electronically to a different pharmacy, notify our office through [facility] or by phone at [phone] option 4.     Si Usted Necesita Algo Despus de Su Visita  Tambin puede enviarnos un mensaje a travs de Clinical Cytogeneticist. Por lo general respondemos a los mensajes de MyChart en el transcurso de 1 a 2 das hbiles.  Para renovar recetas, por favor pida a su farmacia que se ponga en contacto con nuestra oficina. Randi lakes de fax es [place] [phone].  Si tiene un asunto urgente cuando la clnica est cerrada y que no puede esperar hasta el siguiente da hbil, orthoptist a  su doctor(a) al nmero que aparece a continuacin.   Por favor, tenga en cuenta que aunque hacemos todo lo posible para estar disponibles para asuntos urgentes fuera del horario de [place], no estamos disponibles las 24 horas del da, los 7 [address]  [address] de la [place].   Si tiene un problema urgente y no puede comunicarse con nosotros, puede optar por buscar atencin mdica  en el consultorio de su doctor(a), en una clnica privada, en un centro de atencin urgente o en una sala de emergencias.  Si tiene engineer, drilling, por favor llame inmediatamente al 911 o vaya a la sala de emergencias.  Nmeros de bper  - Dr. Hester: [phone]  - Dra. Jackquline: [phone]  - Dr. Claudene: [phone]  - Dra. Kitts: [phone]  En caso de inclemencias del [place], por favor llame a nuestra lnea principal al [phone] para una actualizacin sobre el estado de cualquier retraso o cierre.  Consejos para la  medicacin en dermatologa: Por favor, guarde las cajas en las que vienen los medicamentos de uso tpico para ayudarle a seguir las instrucciones sobre dnde y cmo usarlos. Las farmacias generalmente imprimen las instrucciones del medicamento slo en las cajas y no directamente en los tubos del [place].   Si su medicamento es muy caro, por favor, pngase en contacto con landry rieger llamando al [phone] y presione la opcin 4 o envenos un mensaje a travs de Clinical Cytogeneticist.   No podemos decirle cul ser su copago por los medicamentos por adelantado ya que esto es diferente dependiendo de la cobertura de su seguro. Sin embargo, es posible que podamos encontrar un medicamento sustituto a audiological scientist un formulario para que el seguro cubra el medicamento que se considera necesario.   Si se requiere una autorizacin previa para que su compaa de seguros cubra su medicamento, por favor permtanos de 1 a 2 das hbiles para completar este proceso.  Los precios de los medicamentos varan con frecuencia dependiendo del environmental consultant de dnde se surte la receta y alguna farmacias pueden ofrecer precios ms baratos.  El sitio web www.goodrx.com tiene cupones para medicamentos de health and safety inspector. Los precios aqu no tienen en cuenta lo que podra costar con la ayuda del seguro (puede ser ms barato con su seguro), pero el sitio web puede darle el precio si no utiliz tourist information centre manager.  - Puede imprimir el cupn correspondiente y llevarlo con su receta a la farmacia.  - Tambin puede pasar por nuestra oficina durante el horario de atencin regular y education officer, museum una tarjeta de cupones de GoodRx.  - Si necesita que su receta se enve electrnicamente a una farmacia diferente, informe a nuestra oficina a travs de MyChart de [facility] o por telfono llamando al [phone] y presione la opcin 4.

## 2024-10-08 NOTE — Progress Notes (Signed)
 "  Follow-Up Visit   Subjective  Ryan Blevins is a 74 y.o. male who presents for the following: here for follow up on psoriasis at scalp, reports he has not taken otezla  in over a month, ran out of medication, but when was taking it did not have any side effects such as diarrhea, nausea, weight loss or Gi issue.   Also reports a spot at back of left arm and some issues with fingernails.   The following portions of the chart were reviewed this encounter and updated as appropriate: medications, allergies, medical history  Review of Systems:  No other skin or systemic complaints except as noted in HPI or Assessment and Plan.  Objective  Well appearing patient in no apparent distress; mood and affect are within normal limits.  A focused examination was performed of the following areas: Scalp, fingernails b/l , posterior left arm   Relevant exam findings are noted in the Assessment and Plan.  left forearm and hands x 8 (8) Erythematous stuck-on, waxy papule or plaque face x 4 (4) Erythematous thin papules/macules with gritty scale.   Assessment & Plan   PSORIASIS scalp Exam: Well-demarcated erythematous papules/plaques with silvery scale, guttate pink scaly papules.  Scaly patch at left scalp 3% BSA. Improving on Otezla   Patient started Otezla  on 12/19/2023 , BSA was 10 % ,  now while on Otezla  BSA has decreased to 3 %  Chronic and persistent condition with duration or expected duration over one year. Condition is improving with treatment but not currently at goal. Improving on Otezla   Psoriasis is a chronic non-curable, but treatable genetic/hereditary disease that may have other systemic features affecting other organ systems such as joints (Psoriatic Arthritis). It is associated with an increased risk of inflammatory bowel disease, heart disease, non-alcoholic fatty liver disease, and depression.  Treatments include light and laser treatments; topical medications; and systemic  medications including oral and injectables. Denies side effects such as headache, GI issues, upper respiratory infection, depression or weight decrease  Treatment Plan: Cont Otezla  30mg  qd/bid  Side effects of Otezla  (apremilast ) include diarrhea, nausea, headache, upper respiratory infection, depression, and weight decrease (5-10%). It should only be taken by pregnant women after a discussion regarding risks and benefits with their doctor. Goal is control of skin condition, not cure.  The use of Otezla  requires long term medication management, including periodic office visits.   SEBORRHEIC KERATOSIS - Stuck-on, waxy, tan-brown papules and/or plaques  - Benign-appearing - Discussed benign etiology and prognosis. - Observe - Call for any changes   ACTINIC DAMAGE - chronic, secondary to cumulative UV radiation exposure/sun exposure over time - diffuse scaly erythematous macules with underlying dyspigmentation - Recommend daily broad spectrum sunscreen SPF 30+ to sun-exposed areas, reapply every 2 hours as needed.  - Recommend staying in the shade or wearing long sleeves, sun glasses (UVA+UVB protection) and wide brim hats (4-inch brim around the entire circumference of the hat). - Call for new or changing lesions.  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  May take 3-6 months of regular use to get best result.  Could also add OTC Biotin supplement 2.5 mg daily to help strengthen nails.  Recommend mild soap with handwashing followed by a thick moisturizing cream to fingernails.  May use Cutemol cream,which comes  with DermaNail, Neutrogena Norwegian hand cream, or Eucerin cream original.    INFLAMED SEBORRHEIC KERATOSIS (8) left forearm and  hands x 8 (8) Symptomatic, irritating, patient would like treated.   Discussed will recheck left forearm , if not resolved , will consider bx  - Destruction of lesion - left forearm and hands x 8 (8) Complexity: simple   Destruction method: cryotherapy   Informed consent: discussed and consent obtained   Timeout:  patient name, date of birth, surgical site, and procedure verified Lesion destroyed using liquid nitrogen: Yes   Region frozen until ice ball extended beyond lesion: Yes   Outcome: patient tolerated procedure well with no complications   Post-procedure details: wound care instructions given    ACTINIC KERATOSIS (4) face x 4 (4) Actinic keratoses are precancerous spots that appear secondary to cumulative UV radiation exposure/sun exposure over time. They are chronic with expected duration over 1 year. A portion of actinic keratoses will progress to squamous cell carcinoma of the skin. It is not possible to reliably predict which spots will progress to skin cancer and so treatment is recommended to prevent development of skin cancer.  Recommend daily broad spectrum sunscreen SPF 30+ to sun-exposed areas, reapply every 2 hours as needed.  Recommend staying in the shade or wearing long sleeves, sun glasses (UVA+UVB protection) and wide brim hats (4-inch brim around the entire circumference of the hat). Call for new or changing lesions. - Destruction of lesion - face x 4 (4) Complexity: simple   Destruction method: cryotherapy   Informed consent: discussed and consent obtained   Timeout:  patient name, date of birth, surgical site, and procedure verified Lesion destroyed using liquid nitrogen: Yes   Region frozen until ice ball extended beyond lesion: Yes   Outcome: patient tolerated procedure well with no complications   Post-procedure details: wound care instructions given    PSORIASIS   This Visit - Apremilast  (OTEZLA ) 30 MG TABS - Take 1 tablet (30 mg total) by mouth 2  (two) times daily. COUNSELING AND COORDINATION OF CARE   MEDICATION MANAGEMENT   LONG-TERM USE OF HIGH-RISK MEDICATION   ACTINIC SKIN DAMAGE   SEBORRHEIC KERATOSIS   NAIL DYSTROPHY    Return in about 6 months (around 04/07/2025) for psoriasis/ tbse hx of scc and bcc .  IEleanor Blush, CMA, am acting as scribe for Alm Rhyme, MD.  Documentation: I have reviewed the above documentation for accuracy and completeness, and I agree with the above.  Alm Rhyme, MD    "

## 2024-10-14 ENCOUNTER — Ambulatory Visit (INDEPENDENT_AMBULATORY_CARE_PROVIDER_SITE_OTHER): Payer: PPO

## 2024-10-14 ENCOUNTER — Telehealth: Payer: Self-pay

## 2024-10-14 VITALS — Ht 71.0 in | Wt 204.0 lb

## 2024-10-14 DIAGNOSIS — Z0000 Encounter for general adult medical examination without abnormal findings: Secondary | ICD-10-CM

## 2024-10-14 DIAGNOSIS — J309 Allergic rhinitis, unspecified: Secondary | ICD-10-CM

## 2024-10-14 NOTE — Telephone Encounter (Signed)
 Will route to PCP to address the worsening GERD sxs to see what med he wants to send in.  Will also route to front office to get fasting lab appt scheduled prior to CPE

## 2024-10-14 NOTE — Patient Instructions (Addendum)
 Mr. Hark,  Thank you for taking the time for your Medicare Wellness Visit. I appreciate your continued commitment to your health goals. Please review the care plan we discussed, and feel free to reach out if I can assist you further.  Please note that Annual Wellness Visits do not include a physical exam. Some assessments may be limited, especially if the visit was conducted virtually. If needed, we may recommend an in-person follow-up with your provider.  Ongoing Care Seeing your primary care provider every 3 to 6 months helps us  monitor your health and provide consistent, personalized care.   Referrals If a referral was made during today's visit and you haven't received any updates within two weeks, please contact the referred provider directly to check on the status.  Recommended Screenings:  Health Maintenance  Topic Date Due   COVID-19 Vaccine (7 - 2025-26 season) 05/20/2024   Flu Shot  12/17/2024*   Medicare Annual Wellness Visit  10/14/2025   Colon Cancer Screening  11/10/2026   DTaP/Tdap/Td vaccine (4 - Td or Tdap) 01/27/2033   Pneumococcal Vaccine for age over 80  Completed   Hepatitis C Screening  Completed   Zoster (Shingles) Vaccine  Completed   Meningitis B Vaccine  Aged Out   Hepatitis B Vaccine  Discontinued  *Topic was postponed. The date shown is not the original due date.       10/14/2024   10:15 AM  Advanced Directives  Does Patient Have a Medical Advance Directive? Yes  Type of Estate Agent of [place];Living will  Does patient want to make changes to medical advance directive? Yes (Inpatient - patient requests chaplain consult to change a medical advance directive)  Copy of Healthcare Power of Attorney in Chart? No - copy requested    Vision: Annual vision screenings are recommended for early detection of glaucoma, cataracts, and diabetic retinopathy. These exams can also reveal signs of chronic conditions such as diabetes and high  blood pressure.  Dental: Annual dental screenings help detect early signs of oral cancer, gum disease, and other conditions linked to overall health, including heart disease and diabetes.

## 2024-10-14 NOTE — Progress Notes (Signed)
 "  Chief Complaint  Patient presents with   Medicare Wellness     Subjective:  Please attest and cosign this visit due to patients primary care provider not being in the office at the time the visit was completed.  (Pt of Dr Arlyss Solian)   Ryan Blevins is a 74 y.o. male who presents for a Medicare Annual Wellness Visit.  Visit info / Clinical Intake: Medicare Wellness Visit Type:: Subsequent Annual Wellness Visit Persons participating in visit and providing information:: patient Medicare Wellness Visit Mode:: Video Since this visit was completed virtually, some vitals may be partially provided or unavailable. Missing vitals are due to the limitations of the virtual format.: Documented vitals are patient reported If Telephone or Video please confirm:: I connected with patient using audio/video enable telemedicine. I verified patient identity with two identifiers, discussed telehealth limitations, and patient agreed to proceed. Patient Location:: Home Provider Location:: Office Interpreter Needed?: No Pre-visit prep was completed: yes AWV questionnaire completed by patient prior to visit?: yes Date:: 10/13/24 Living arrangements:: lives with spouse/significant other Patient's Overall Health Status Rating: good Typical amount of pain: none Does pain affect daily life?: no Are you currently prescribed opioids?: no  Dietary Habits and Nutritional Risks How many meals a day?: 2 Eats fruit and vegetables daily?: yes Most meals are obtained by: eating out In the last 2 weeks, have you had any of the following?: none Diabetic:: no  Functional Status Activities of Daily Living (to include ambulation/medication): Independent Ambulation: Independent with device- listed below Home Assistive Devices/Equipment: Eyeglasses; Contact lenses Medication Administration: Independent Home Management (perform basic housework or laundry): Independent Manage your own finances?: yes Primary  transportation is: driving Concerns about vision?: no *vision screening is required for WTM* Concerns about hearing?: (!) yes Uses hearing aids?: (!) yes Hear whispered voice?: yes  Fall Screening Falls in the past year?: 0 Number of falls in past year: 0 Was there an injury with Fall?: 0 Fall Risk Category Calculator: 0 Patient Fall Risk Level: Low Fall Risk  Fall Risk Patient at Risk for Falls Due to: No Fall Risks Fall risk Follow up: Falls evaluation completed; Falls prevention discussed  Home and Transportation Safety: All rugs have non-skid backing?: yes All stairs or steps have railings?: yes Grab bars in the bathtub or shower?: (!) no Have non-skid surface in bathtub or shower?: (!) no Good home lighting?: yes Regular seat belt use?: yes Hospital stays in the last year:: no  Cognitive Assessment Difficulty concentrating, remembering, or making decisions? : no Will 6CIT or Mini Cog be Completed: yes What year is it?: 0 points What month is it?: 0 points Give patient an address phrase to remember (5 components): [address] [place], [place] About what time is it?: 0 points Count backwards from 20 to 1: 0 points Say the months of the year in reverse: 0 points Repeat the address phrase from earlier: 0 points 6 CIT Score: 0 points  Advance Directives (For Healthcare) Does Patient Have a Medical Advance Directive?: Yes Does patient want to make changes to medical advance directive?: Yes (Inpatient - patient requests chaplain consult to change a medical advance directive) Type of Advance Directive: Healthcare Power of [place]; Living will Copy of Healthcare Power of Attorney in Chart?: No - copy requested Copy of Living Will in Chart?: No - copy requested  Reviewed/Updated  Reviewed/Updated: Reviewed All (Medical, Surgical, Family, Medications, Allergies, Care Teams, Patient Goals)    Allergies (verified) Atorvastatin, Bupropion hcl, and Codeine  Current  Medications (verified) Outpatient Encounter Medications as of 10/14/2024  Medication Sig   Apremilast  (OTEZLA ) 30 MG TABS Take 1 tablet (30 mg total) by mouth 2 (two) times daily.   cetirizine (ZYRTEC) 10 MG tablet Take 10 mg by mouth daily.   Clobetasol  Propionate 0.05 % shampoo Apply 0.5 % topically.   colchicine  0.6 MG tablet Take 1 tablet (0.6 mg total) by mouth daily as needed.   fluticasone  (FLONASE ) 50 MCG/ACT nasal spray SHAKE LIQUID AND USE 2 SPRAYS IN EACH NOSTRIL DAILY   Halobetasol  Propionate 0.05 % FOAM Apply to aa's scalp QD PRN. Avoid applying to face, groin, and axilla. Use as directed. Long-term use can cause thinning of the skin.   hydrocortisone  (ANUSOL -HC) 25 MG suppository Place 1 suppository (25 mg total) rectally 2 (two) times daily.   hydrocortisone  2.5 % cream Apply topically 2 (two) times daily.   ketoconazole  (NIZORAL ) 2 % shampoo Apply 1 Application topically 3 (three) times a week. Wash scalp 3 times weekly, let sit for 5 minutes and rinse out   lisinopril  (ZESTRIL ) 30 MG tablet Take 1 tablet (30 mg total) by mouth daily.   mupirocin  ointment (BACTROBAN ) 2 % Apply 1 Application topically 3 (three) times daily. Tid to affected fingers   Roflumilast  (ZORYVE ) 0.3 % CREA Apply 1 Application topically as directed. Apply to aa scalp 3 days a week at bedtime Tuesday, Thursday and Saturday   Roflumilast , Antiseborrheic, (ZORYVE ) 0.3 % FOAM Apply 1 Application topically at bedtime.   rosuvastatin  (CRESTOR ) 10 MG tablet TAKE ONE TABLET BY MOUTH DAILY   sildenafil  (REVATIO ) 20 MG tablet Take 4-5 tablets (80-100 mg total) by mouth daily as needed.   tamsulosin  (FLOMAX ) 0.4 MG CAPS capsule TAKE 1 CAPSULE BY MOUTH DAILY   Tapinarof  (VTAMA ) 1 % CREA Apply to aa's psoriasis QD PRN.   venlafaxine  XR (EFFEXOR -XR) 75 MG 24 hr capsule Take 2 capsules (150 mg total) by mouth daily.   ASPIRIN 81 PO Take by mouth daily. (Patient not taking: Reported on 10/14/2024)   No  facility-administered encounter medications on file as of 10/14/2024.    History: Past Medical History:  Diagnosis Date   Actinic keratosis 04/18/2018   Left nasal ala. Hypertrophic   Basal cell carcinoma 02/25/2010   Left med. pretibial. Superficial with focal infiltration.   Basal cell carcinoma 04/29/2013   Right superior helix. Excised: 06/12/2013   Basal cell carcinoma 11/09/2020   left medial upper eyelid - MOHs   Basal cell carcinoma 06/08/2023   right ear middle helix, EDC   Depression    Dysplastic nevus 08/19/2015   Left med medial calf. DN with severe atypia, lateral margin involved. Excised: 09/01/2015, margins free.   Hemorrhoid    Hyperlipidemia    Hypertension    Hyperuricemia    Low testosterone 2011   Prostate cancer (HCC) 2011   per Dr. Alline.  Seed implant per Dr. Jason   Skin cancer    Squamous cell carcinoma of skin 11/24/2021   Right Ear at the superior antihelix - tx with ED&C   Squamous cell carcinoma of skin 01/24/2022   right hand dorsum, EDC   Syncope    Wears hearing aid in both ears    Past Surgical History:  Procedure Laterality Date   CATARACT EXTRACTION W/PHACO Left 03/28/2022   Procedure: CATARACT EXTRACTION PHACO AND INTRAOCULAR LENS PLACEMENT (IOC) LEFT eyhance toric lens 4.89 00:43.1;  Surgeon: Myrna Adine Anes, MD;  Location: [facility];  Service: Ophthalmology;  Laterality: Left;   CATARACT EXTRACTION W/PHACO Right 07/11/2022   Procedure: CATARACT EXTRACTION PHACO AND INTRAOCULAR LENS PLACEMENT (IOC) RIGHT;  Surgeon: Myrna Adine Anes, MD;  Location: [facility];  Service: Ophthalmology;  Laterality: Right;  6.78 0:52.8   COLONOSCOPY  02/20/2009   Dr Dessa   COLONOSCOPY WITH PROPOFOL  N/A 10/24/2018   Procedure: COLONOSCOPY WITH PROPOFOL ;  Surgeon: Dessa Reyes ORN, MD;  Location: [facility];  Service: Endoscopy;  Laterality: N/A;   COLONOSCOPY WITH PROPOFOL  N/A 11/10/2021   Procedure: COLONOSCOPY WITH  PROPOFOL ;  Surgeon: Dessa Reyes ORN, MD;  Location: [facility] END[facility];  Service: Endoscopy;  Laterality: N/A;   Family History  Problem Relation Age of Onset   Heart disease Mother        Multiple heart surgeries, valve repair   Osteoporosis Mother        Vertebral fracture   Stroke Mother    Hypertension Father    Hyperlipidemia Father    Diabetes Father    Parkinson's disease Father    Colon cancer Neg Hx    Social History   Occupational History   Occupation: Owns engineer, site in [place]    Employer: [employer]  Tobacco Use   Smoking status: Never   Smokeless tobacco: Never  Vaping Use   Vaping status: Never Used  Substance and Sexual Activity   Alcohol use: Yes    Alcohol/week: 6.0 standard drinks of alcohol    Types: 1 Shots of liquor, 5 Standard drinks or equivalent per week   Drug use: No   Sexual activity: Yes   Tobacco Counseling Counseling given: No  SDOH Screenings   Food Insecurity: No Food Insecurity (10/14/2024)  Housing: Low Risk (10/14/2024)  Transportation Needs: No Transportation Needs (10/14/2024)  Utilities: Not At Risk (10/14/2024)  Alcohol Screen: Low Risk (10/13/2024)  Depression (PHQ2-9): Low Risk (10/14/2024)  Financial Resource Strain: Low Risk (10/13/2024)  Physical Activity: Inactive (10/14/2024)  Social Connections: Socially Integrated (10/14/2024)  Stress: No Stress Concern Present (10/14/2024)  Tobacco Use: Low Risk (10/14/2024)  Health Literacy: Adequate Health Literacy (10/14/2024)   See flowsheets for full screening details  Depression Screen PHQ 2 & 9 Depression Scale- Over the past 2 weeks, how often have you been bothered by any of the following problems? Little interest or pleasure in doing things: 0 Feeling down, depressed, or hopeless (PHQ Adolescent also includes...irritable): 0 PHQ-2 Total Score: 0 Trouble falling or staying asleep, or sleeping too much: 0 Feeling tired or having little energy: 0 Poor appetite or  overeating (PHQ Adolescent also includes...weight loss): 0 Feeling bad about yourself - or that you are a failure or have let yourself or your family down: 0 Trouble concentrating on things, such as reading the newspaper or watching television (PHQ Adolescent also includes...like school work): 0 Moving or speaking so slowly that other people could have noticed. Or the opposite - being so fidgety or restless that you have been moving around a lot more than usual: 0 Thoughts that you would be better off dead, or of hurting yourself in some way: 0 PHQ-9 Total Score: 0 If you checked off any problems, how difficult have these problems made it for you to do your work, take care of things at home, or get along with other people?: Not difficult at all  Depression Treatment Depression Interventions/Treatment : EYV7-0 Score <4 Follow-up Not Indicated     Goals Addressed               This  Visit's Progress     Patient Stated        Patient Stated (pt-stated)        Patient stated he plans to continue eating healthy              Objective:    Today's Vitals   10/14/24 1013  Weight: 204 lb (92.5 kg)  Height: 5' 11 (1.803 m)   Body mass index is 28.45 kg/m.  Hearing/Vision screen Hearing Screening - Comments:: Wears hearing aids Vision Screening - Comments:: Wears rx glasses - up to date with routine eye exams with Dr Myrna Immunizations and Health Maintenance Health Maintenance  Topic Date Due   COVID-19 Vaccine (7 - 2025-26 season) 05/20/2024   Influenza Vaccine  12/17/2024 (Originally 04/19/2024)   Medicare Annual Wellness (AWV)  10/14/2025   Colonoscopy  11/10/2026   DTaP/Tdap/Td (4 - Td or Tdap) 01/27/2033   Pneumococcal Vaccine: 50+ Years  Completed   Hepatitis C Screening  Completed   Zoster Vaccines- Shingrix  Completed   Meningococcal B Vaccine  Aged Out   Hepatitis B Vaccines 19-59 Average Risk  Discontinued        Assessment/Plan:  This is a routine wellness  examination for Ryan Blevins.  Patient Care Team: Cleatus Arlyss RAMAN, MD as PCP - General (Family Medicine) Alline Lenis, MD (Inactive) as Consulting Physician (Urology) Jason Charleston, MD (Inactive) as Consulting Physician (Radiation Oncology) Dessa Reyes ORN, MD (General Surgery) Cam Morene ORN, MD as Attending Physician (Urology) Myrna Adine Anes, MD as Consulting Physician (Ophthalmology)  I have personally reviewed and noted the following in the patients chart:   Medical and social history Use of alcohol, tobacco or illicit drugs  Current medications and supplements including opioid prescriptions. Functional ability and status Nutritional status Physical activity Advanced directives List of other physicians Hospitalizations, surgeries, and ER visits in previous 12 months Vitals Screenings to include cognitive, depression, and falls Referrals and appointments  No orders of the defined types were placed in this encounter.  In addition, I have reviewed and discussed with patient certain preventive protocols, quality metrics, and best practice recommendations. A written personalized care plan for preventive services as well as general preventive health recommendations were provided to patient.   Verdie CHRISTELLA Saba, CMA   10/14/2024   Return in 1 year (on 10/14/2025).  After Visit Summary: (MyChart) Due to this being a telephonic visit, the after visit summary with patients personalized plan was offered to patient via MyChart   Nurse Notes: scheduled 2027 AWV appt "

## 2024-10-14 NOTE — Telephone Encounter (Signed)
 1. needs a lab appt prior to CPE appt on 10/22/24. 2. requesting med for acid reflux (worsening). 3. Needs refills on meds - will run out prior to CPE appt. Please call

## 2024-10-15 ENCOUNTER — Telehealth: Payer: Self-pay | Admitting: *Deleted

## 2024-10-15 NOTE — Telephone Encounter (Signed)
 Copied from CRM [id number]. Topic: Clinical - Request for Lab/Test Order >> Oct 15, 2024 10:52 AM Carlyon D wrote: Reason for CRM:  Pt is calling and would like his yearly lab orders entered so he can come get his blood work before the 3 of February. Pt would like a phone call when orders are in so he can get scheduled and get labs done prior to appt 2/3.

## 2024-10-15 NOTE — Telephone Encounter (Signed)
 Please schedule fasting labs prior to CPE appt.

## 2024-10-16 ENCOUNTER — Ambulatory Visit: Payer: Self-pay | Admitting: Family Medicine

## 2024-10-16 ENCOUNTER — Other Ambulatory Visit (INDEPENDENT_AMBULATORY_CARE_PROVIDER_SITE_OTHER)

## 2024-10-16 ENCOUNTER — Other Ambulatory Visit: Payer: Self-pay | Admitting: Family Medicine

## 2024-10-16 DIAGNOSIS — I10 Essential (primary) hypertension: Secondary | ICD-10-CM

## 2024-10-16 DIAGNOSIS — E790 Hyperuricemia without signs of inflammatory arthritis and tophaceous disease: Secondary | ICD-10-CM | POA: Diagnosis not present

## 2024-10-16 DIAGNOSIS — R739 Hyperglycemia, unspecified: Secondary | ICD-10-CM | POA: Diagnosis not present

## 2024-10-16 DIAGNOSIS — Z125 Encounter for screening for malignant neoplasm of prostate: Secondary | ICD-10-CM | POA: Diagnosis not present

## 2024-10-16 DIAGNOSIS — E782 Mixed hyperlipidemia: Secondary | ICD-10-CM

## 2024-10-16 DIAGNOSIS — C61 Malignant neoplasm of prostate: Secondary | ICD-10-CM

## 2024-10-16 LAB — CBC WITH DIFFERENTIAL/PLATELET
Basophils Absolute: 0.1 10*3/uL (ref 0.0–0.1)
Basophils Relative: 0.4 % (ref 0.0–3.0)
Eosinophils Absolute: 0.2 10*3/uL (ref 0.0–0.7)
Eosinophils Relative: 1.4 % (ref 0.0–5.0)
HCT: 46.6 % (ref 39.0–52.0)
Hemoglobin: 15.4 g/dL (ref 13.0–17.0)
Lymphocytes Relative: 6.8 % — ABNORMAL LOW (ref 12.0–46.0)
Lymphs Abs: 0.8 10*3/uL (ref 0.7–4.0)
MCHC: 33 g/dL (ref 30.0–36.0)
MCV: 90.7 fl (ref 78.0–100.0)
Monocytes Absolute: 0.9 10*3/uL (ref 0.1–1.0)
Monocytes Relative: 7 % (ref 3.0–12.0)
Neutro Abs: 10.3 10*3/uL — ABNORMAL HIGH (ref 1.4–7.7)
Neutrophils Relative %: 84.4 % — ABNORMAL HIGH (ref 43.0–77.0)
Platelets: 304 10*3/uL (ref 150.0–400.0)
RBC: 5.14 Mil/uL (ref 4.22–5.81)
RDW: 13.9 % (ref 11.5–15.5)
WBC: 12.2 10*3/uL — ABNORMAL HIGH (ref 4.0–10.5)

## 2024-10-16 LAB — COMPREHENSIVE METABOLIC PANEL WITH GFR
ALT: 15 U/L (ref 3–53)
AST: 14 U/L (ref 5–37)
Albumin: 4.4 g/dL (ref 3.5–5.2)
Alkaline Phosphatase: 90 U/L (ref 39–117)
BUN: 18 mg/dL (ref 6–23)
CO2: 28 meq/L (ref 19–32)
Calcium: 9.5 mg/dL (ref 8.4–10.5)
Chloride: 102 meq/L (ref 96–112)
Creatinine, Ser: 1.23 mg/dL (ref 0.40–1.50)
GFR: 58.33 mL/min — ABNORMAL LOW
Glucose, Bld: 139 mg/dL — ABNORMAL HIGH (ref 70–99)
Potassium: 5 meq/L (ref 3.5–5.1)
Sodium: 137 meq/L (ref 135–145)
Total Bilirubin: 0.6 mg/dL (ref 0.2–1.2)
Total Protein: 7.3 g/dL (ref 6.0–8.3)

## 2024-10-16 LAB — LIPID PANEL
Cholesterol: 162 mg/dL (ref 28–200)
HDL: 29.3 mg/dL — ABNORMAL LOW
LDL Cholesterol: 90 mg/dL (ref 10–99)
NonHDL: 132.51
Total CHOL/HDL Ratio: 6
Triglycerides: 211 mg/dL — ABNORMAL HIGH (ref 10.0–149.0)
VLDL: 42.2 mg/dL — ABNORMAL HIGH (ref 0.0–40.0)

## 2024-10-16 LAB — PSA, MEDICARE: PSA: 0 ng/mL — ABNORMAL LOW (ref 0.10–4.00)

## 2024-10-16 LAB — URIC ACID: Uric Acid, Serum: 8.8 mg/dL — ABNORMAL HIGH (ref 4.0–7.8)

## 2024-10-16 LAB — HEMOGLOBIN A1C: Hgb A1c MFr Bld: 6.6 % — ABNORMAL HIGH (ref 4.6–6.5)

## 2024-10-16 MED ORDER — ROSUVASTATIN CALCIUM 10 MG PO TABS: ORAL_TABLET | ORAL | 3 refills | Status: AC

## 2024-10-16 MED ORDER — FAMOTIDINE 20 MG PO TABS: 20.0000 mg | ORAL_TABLET | Freq: Two times a day (BID) | ORAL | Status: DC

## 2024-10-16 MED ORDER — OMEPRAZOLE 20 MG PO CPDR: 20.0000 mg | DELAYED_RELEASE_CAPSULE | Freq: Every day | ORAL | Status: AC

## 2024-10-16 MED ORDER — LISINOPRIL 30 MG PO TABS: 30.0000 mg | ORAL_TABLET | Freq: Every day | ORAL | 3 refills | Status: AC

## 2024-10-16 MED ORDER — TAMSULOSIN HCL 0.4 MG PO CAPS: 0.4000 mg | ORAL_CAPSULE | Freq: Every day | ORAL | 3 refills | Status: AC

## 2024-10-16 MED ORDER — COLCHICINE 0.6 MG PO TABS: 0.6000 mg | ORAL_TABLET | Freq: Every day | ORAL | 1 refills | Status: AC | PRN

## 2024-10-16 MED ORDER — VENLAFAXINE HCL ER 75 MG PO CP24: 150.0000 mg | ORAL_CAPSULE | Freq: Every day | ORAL | 3 refills | Status: AC

## 2024-10-16 MED ORDER — FLUTICASONE PROPIONATE 50 MCG/ACT NA SUSP: NASAL | 3 refills | Status: AC

## 2024-10-16 MED ORDER — SILDENAFIL CITRATE 20 MG PO TABS: 80.0000 mg | ORAL_TABLET | Freq: Every day | ORAL | 12 refills | Status: AC | PRN

## 2024-10-16 MED ORDER — FAMOTIDINE 20 MG PO TABS: 20.0000 mg | ORAL_TABLET | Freq: Every day | ORAL | Status: AC

## 2024-10-16 NOTE — Addendum Note (Signed)
 Addended by: CLEATUS LORELI RAMAN on: 10/16/2024 01:41 PM   Modules accepted: Orders

## 2024-10-16 NOTE — Telephone Encounter (Signed)
 Labs done.  Rxs sent.   What has he tried for GERD?  How does he describe his symptoms?  Does he have exertional chest pain?  Please let me know.  Thanks.

## 2024-10-16 NOTE — Addendum Note (Signed)
 Addended by: CLEATUS LORELI RAMAN on: 10/16/2024 05:41 PM   Modules accepted: Orders

## 2024-10-16 NOTE — Telephone Encounter (Signed)
 Called patient reviewed all information and repeated back to me. Will call if any questions.    Patient has taken Pepcid  when he has symptoms. He has mild improvement. Just has burning in middle of chest off and on. Increased with position, and diet . Denies any exertional chest pain

## 2024-10-16 NOTE — Telephone Encounter (Signed)
 He could try taking pepcid  daily to see if that helps, ie not take it prn.    If that isn't helping, then could try daily OTC 20mg  omeprazole .    Thanks.

## 2024-10-18 ENCOUNTER — Other Ambulatory Visit: Payer: Self-pay | Admitting: Family Medicine

## 2024-10-18 NOTE — Telephone Encounter (Signed)
Spoke with patient and recommendations given. Patient verbalized understanding

## 2024-10-22 ENCOUNTER — Encounter: Admitting: Family Medicine

## 2024-11-01 ENCOUNTER — Encounter: Admitting: Family Medicine

## 2025-04-15 ENCOUNTER — Ambulatory Visit: Admitting: Dermatology

## 2025-10-23 ENCOUNTER — Encounter: Admitting: Family Medicine

## 2025-10-23 ENCOUNTER — Ambulatory Visit
